# Patient Record
Sex: Male | Born: 1970
Health system: Southern US, Community
[De-identification: ages and names within clinical notes are randomized; demographics above are authoritative.]

## PROBLEM LIST (undated history)

## (undated) DIAGNOSIS — I739 Peripheral vascular disease, unspecified: Secondary | ICD-10-CM

## (undated) DIAGNOSIS — I10 Essential (primary) hypertension: Secondary | ICD-10-CM

## (undated) DIAGNOSIS — E119 Type 2 diabetes mellitus without complications: Secondary | ICD-10-CM

## (undated) DIAGNOSIS — N186 End stage renal disease: Secondary | ICD-10-CM

## (undated) DIAGNOSIS — E785 Hyperlipidemia, unspecified: Secondary | ICD-10-CM

---

## 2003-08-11 ENCOUNTER — Inpatient Hospital Stay (HOSPITAL_COMMUNITY): Admission: AD | Admit: 2003-08-11 | Discharge: 2003-08-12 | Payer: Self-pay | Admitting: Internal Medicine

## 2003-08-11 ENCOUNTER — Encounter: Payer: Self-pay | Admitting: Internal Medicine

## 2003-08-12 ENCOUNTER — Encounter: Payer: Self-pay | Admitting: Internal Medicine

## 2005-09-11 ENCOUNTER — Emergency Department (HOSPITAL_COMMUNITY): Admission: EM | Admit: 2005-09-11 | Discharge: 2005-09-11 | Payer: Self-pay | Admitting: Emergency Medicine

## 2018-08-27 DIAGNOSIS — I10 Essential (primary) hypertension: Secondary | ICD-10-CM | POA: Diagnosis not present

## 2018-08-27 DIAGNOSIS — R0789 Other chest pain: Secondary | ICD-10-CM | POA: Diagnosis not present

## 2018-08-27 DIAGNOSIS — E1159 Type 2 diabetes mellitus with other circulatory complications: Secondary | ICD-10-CM | POA: Diagnosis not present

## 2018-08-27 DIAGNOSIS — E1122 Type 2 diabetes mellitus with diabetic chronic kidney disease: Secondary | ICD-10-CM | POA: Diagnosis not present

## 2018-09-19 DIAGNOSIS — R079 Chest pain, unspecified: Secondary | ICD-10-CM | POA: Diagnosis not present

## 2018-09-19 DIAGNOSIS — Z8679 Personal history of other diseases of the circulatory system: Secondary | ICD-10-CM | POA: Diagnosis not present

## 2018-09-19 DIAGNOSIS — I739 Peripheral vascular disease, unspecified: Secondary | ICD-10-CM | POA: Diagnosis not present

## 2018-09-19 DIAGNOSIS — E119 Type 2 diabetes mellitus without complications: Secondary | ICD-10-CM | POA: Diagnosis not present

## 2018-09-24 DIAGNOSIS — D649 Anemia, unspecified: Secondary | ICD-10-CM | POA: Diagnosis not present

## 2018-09-24 DIAGNOSIS — R809 Proteinuria, unspecified: Secondary | ICD-10-CM | POA: Diagnosis not present

## 2018-09-24 DIAGNOSIS — N184 Chronic kidney disease, stage 4 (severe): Secondary | ICD-10-CM | POA: Diagnosis not present

## 2018-10-03 ENCOUNTER — Other Ambulatory Visit (HOSPITAL_COMMUNITY): Payer: Self-pay | Admitting: Nephrology

## 2018-10-03 DIAGNOSIS — N183 Chronic kidney disease, stage 3 unspecified: Secondary | ICD-10-CM

## 2018-10-06 ENCOUNTER — Other Ambulatory Visit (HOSPITAL_COMMUNITY): Payer: Self-pay | Admitting: Nephrology

## 2018-10-06 ENCOUNTER — Ambulatory Visit (INDEPENDENT_AMBULATORY_CARE_PROVIDER_SITE_OTHER): Payer: 59 | Admitting: Vascular Surgery

## 2018-10-06 ENCOUNTER — Encounter: Payer: Self-pay | Admitting: Vascular Surgery

## 2018-10-06 VITALS — BP 140/92 | HR 86 | Temp 97.5°F | Resp 18 | Ht 66.0 in | Wt 204.2 lb

## 2018-10-06 DIAGNOSIS — I739 Peripheral vascular disease, unspecified: Secondary | ICD-10-CM | POA: Diagnosis not present

## 2018-10-06 DIAGNOSIS — I10 Essential (primary) hypertension: Secondary | ICD-10-CM

## 2018-10-06 NOTE — Progress Notes (Signed)
Vascular and Vein Specialist of [facility]  Patient name: Larry Banks MRN: [id number] DOB: 1971/05/28 Sex: male  REASON FOR CONSULT: Evaluate lower extremity arterial insufficiency  Seen today in our [place] office  HPI: Larry Banks is a 47 y.o. male, who is here today with his wife for further discussion of lower extremity arterial insufficiency.  He is a very pleasant gentleman.  He has 2 components of his lower extremity discomfort.  He does have very straightforward calf claudication.  He can walk very little before he has to stop rest for few minutes and then resume.  Fortunately has no history of any lower extremity tissue loss.  He also has significant bilateral nocturnal cramping which is very uncomfortable to him.  No history of cardiac disease and no history of stroke.  He is right-handed.  He does have severe renal insufficiency and is being evaluated by nephrology for potential dialysis versus transplant.  He is a long-term cigarette smoker.  History reviewed. No pertinent past medical history.  History reviewed. No pertinent family history.  SOCIAL HISTORY: Social History   Socioeconomic History  . Marital status: Married    Spouse name: Not on file  . Number of children: Not on file  . Years of education: Not on file  . Highest education level: Not on file  Occupational History  . Not on file  Social Needs  . Financial resource strain: Not on file  . Food insecurity:    Worry: Not on file    Inability: Not on file  . Transportation needs:    Medical: Not on file    Non-medical: Not on file  Tobacco Use  . Smoking status: Not on file  Substance and Sexual Activity  . Alcohol use: Not on file  . Drug use: Not on file  . Sexual activity: Not on file  Lifestyle  . Physical activity:    Days per week: Not on file    Minutes per session: Not on file  . Stress: Not on file  Relationships  . Social connections:      Talks on phone: Not on file    Gets together: Not on file    Attends religious service: Not on file    Active member of club or organization: Not on file    Attends meetings of clubs or organizations: Not on file    Relationship status: Not on file  . Intimate partner violence:    Fear of current or ex partner: Not on file    Emotionally abused: Not on file    Physically abused: Not on file    Forced sexual activity: Not on file  Other Topics Concern  . Not on file  Social History Narrative  . Not on file    No Known Allergies  Current Outpatient Medications  Medication Sig Dispense Refill  . atorvastatin (LIPITOR) 20 MG tablet Take 20 mg by mouth daily.    . chlorthalidone (HYGROTON) 25 MG tablet Take 25 mg by mouth daily.     No current facility-administered medications for this visit.     REVIEW OF SYSTEMS:  [X]  denotes positive finding, [ ]  denotes negative finding Cardiac  Comments:  Chest pain or chest pressure: x   Shortness of breath upon exertion: x   Short of breath when lying flat:    Irregular heart rhythm:        Vascular    Pain in calf, thigh, or hip brought on by  ambulation: x   Pain in feet at night that wakes you up from your sleep:  x   Blood clot in your veins:    Leg swelling:         Pulmonary    Oxygen at home:    Productive cough:     Wheezing:         Neurologic    Sudden weakness in arms or legs:     Sudden numbness in arms or legs:     Sudden onset of difficulty speaking or slurred speech:    Temporary loss of vision in one eye:     Problems with dizziness:         Gastrointestinal    Blood in stool:     Vomited blood:         Genitourinary    Burning when urinating:     Blood in urine:        Psychiatric    Major depression:         Hematologic    Bleeding problems:    Problems with blood clotting too easily:        Skin    Rashes or ulcers:        Constitutional    Fever or chills:      PHYSICAL EXAM: Vitals:    10/06/18 1028  BP: (!) 140/92  Pulse: 86  Resp: 18  Temp: (!) 97.5 F (36.4 C)  TempSrc: Temporal  Weight: 204 lb 3.2 oz (92.6 kg)  Height: 5\' 6"  (1.676 m)    GENERAL: The patient is a well-nourished male, in no acute distress. The vital signs are documented above. CARDIOVASCULAR: Carotid arteries without bruits bilaterally.  2+ radial and 2+ ulnar pulses bilaterally.  Large surface veins with nice cephalic veins bilaterally.  I do not palpate popliteal or distal pulses bilaterally. PULMONARY: There is good air exchange  ABDOMEN: Soft and non-tender  MUSCULOSKELETAL: There are no major deformities or cyanosis. NEUROLOGIC: No focal weakness or paresthesias are detected. SKIN: There are no ulcers or rashes noted. PSYCHIATRIC: The patient has a normal affect.  DATA:  Invasive studies 09/19/2018 at [facility] rocking him to ankle arm index of 0.89 on the right and 0.44 on the left  MEDICAL ISSUES: Long discussion with patient and his wife.  I explained that he does have very clear-cut left calf claudication symptoms related to his probable superficial femoral artery occlusion.  Fortunately he has not had any tissue loss or arterial rest pain.  Explained that the next evaluation would be arteriography.  I explained that this would put him at extreme high risk for progression to renal failure with his uncertain current renal status.  I have recommended that we defer any evaluation until his renal work-up is complete.  If he should develop any tissue loss he knows to notify us and we would proceed.  Otherwise he will continue his walking program.  I discussed the critical importance of smoking cessation with him.  We will see him again in 3 months for continued discussion   Rosetta Posner, MD [facility] Vascular and Vein Specialists of [facility] Tel [phone] Pager [phone]

## 2018-10-07 ENCOUNTER — Encounter: Payer: Self-pay | Admitting: Family Medicine

## 2018-10-15 ENCOUNTER — Ambulatory Visit (HOSPITAL_BASED_OUTPATIENT_CLINIC_OR_DEPARTMENT_OTHER)
Admission: RE | Admit: 2018-10-15 | Discharge: 2018-10-15 | Disposition: A | Discharge status: Home / Self Care | Payer: 59 | Source: Ambulatory Visit | Attending: Family Medicine | Admitting: Family Medicine

## 2018-10-15 ENCOUNTER — Ambulatory Visit (HOSPITAL_COMMUNITY)
Admission: RE | Admit: 2018-10-15 | Discharge: 2018-10-15 | Disposition: A | Discharge status: Home / Self Care | Payer: 59 | Source: Ambulatory Visit | Attending: Nephrology | Admitting: Nephrology

## 2018-10-15 DIAGNOSIS — N184 Chronic kidney disease, stage 4 (severe): Secondary | ICD-10-CM | POA: Diagnosis not present

## 2018-10-15 DIAGNOSIS — N183 Chronic kidney disease, stage 3 unspecified: Secondary | ICD-10-CM

## 2018-10-15 DIAGNOSIS — I10 Essential (primary) hypertension: Secondary | ICD-10-CM

## 2018-10-15 DIAGNOSIS — Z79899 Other long term (current) drug therapy: Secondary | ICD-10-CM | POA: Diagnosis not present

## 2018-10-15 NOTE — Progress Notes (Signed)
*  PRELIMINARY RESULTS* Echocardiogram 2D Echocardiogram has been performed.  Samuel Germany 10/15/2018, 9:56 AM

## 2018-11-12 DIAGNOSIS — Z23 Encounter for immunization: Secondary | ICD-10-CM | POA: Diagnosis not present

## 2018-11-17 DIAGNOSIS — N183 Chronic kidney disease, stage 3 (moderate): Secondary | ICD-10-CM | POA: Diagnosis not present

## 2018-11-17 DIAGNOSIS — I70213 Atherosclerosis of native arteries of extremities with intermittent claudication, bilateral legs: Secondary | ICD-10-CM | POA: Diagnosis not present

## 2018-11-17 DIAGNOSIS — E1122 Type 2 diabetes mellitus with diabetic chronic kidney disease: Secondary | ICD-10-CM | POA: Diagnosis not present

## 2018-11-17 DIAGNOSIS — I739 Peripheral vascular disease, unspecified: Secondary | ICD-10-CM | POA: Diagnosis not present

## 2019-01-05 ENCOUNTER — Ambulatory Visit: Payer: 59 | Admitting: Vascular Surgery

## 2019-01-23 DIAGNOSIS — Z87891 Personal history of nicotine dependence: Secondary | ICD-10-CM | POA: Diagnosis not present

## 2019-01-23 DIAGNOSIS — I739 Peripheral vascular disease, unspecified: Secondary | ICD-10-CM | POA: Diagnosis not present

## 2019-01-23 DIAGNOSIS — N184 Chronic kidney disease, stage 4 (severe): Secondary | ICD-10-CM | POA: Diagnosis not present

## 2019-03-09 DIAGNOSIS — N185 Chronic kidney disease, stage 5: Secondary | ICD-10-CM | POA: Diagnosis not present

## 2019-03-09 DIAGNOSIS — E1122 Type 2 diabetes mellitus with diabetic chronic kidney disease: Secondary | ICD-10-CM | POA: Diagnosis not present

## 2019-03-12 DIAGNOSIS — E1122 Type 2 diabetes mellitus with diabetic chronic kidney disease: Secondary | ICD-10-CM | POA: Diagnosis not present

## 2019-03-12 DIAGNOSIS — I120 Hypertensive chronic kidney disease with stage 5 chronic kidney disease or end stage renal disease: Secondary | ICD-10-CM | POA: Diagnosis not present

## 2019-03-12 DIAGNOSIS — N185 Chronic kidney disease, stage 5: Secondary | ICD-10-CM | POA: Diagnosis not present

## 2019-03-16 DIAGNOSIS — I129 Hypertensive chronic kidney disease with stage 1 through stage 4 chronic kidney disease, or unspecified chronic kidney disease: Secondary | ICD-10-CM | POA: Diagnosis not present

## 2019-03-16 DIAGNOSIS — N183 Chronic kidney disease, stage 3 (moderate): Secondary | ICD-10-CM | POA: Diagnosis not present

## 2019-03-16 DIAGNOSIS — I739 Peripheral vascular disease, unspecified: Secondary | ICD-10-CM | POA: Diagnosis not present

## 2019-03-16 DIAGNOSIS — N189 Chronic kidney disease, unspecified: Secondary | ICD-10-CM | POA: Diagnosis not present

## 2019-03-16 DIAGNOSIS — Z7982 Long term (current) use of aspirin: Secondary | ICD-10-CM | POA: Diagnosis not present

## 2019-03-16 DIAGNOSIS — I70212 Atherosclerosis of native arteries of extremities with intermittent claudication, left leg: Secondary | ICD-10-CM | POA: Diagnosis not present

## 2019-03-16 DIAGNOSIS — E1122 Type 2 diabetes mellitus with diabetic chronic kidney disease: Secondary | ICD-10-CM | POA: Diagnosis not present

## 2019-03-19 DIAGNOSIS — N185 Chronic kidney disease, stage 5: Secondary | ICD-10-CM | POA: Diagnosis not present

## 2019-04-08 DIAGNOSIS — I739 Peripheral vascular disease, unspecified: Secondary | ICD-10-CM | POA: Diagnosis not present

## 2019-04-08 DIAGNOSIS — E1122 Type 2 diabetes mellitus with diabetic chronic kidney disease: Secondary | ICD-10-CM | POA: Diagnosis not present

## 2019-04-08 DIAGNOSIS — N185 Chronic kidney disease, stage 5: Secondary | ICD-10-CM | POA: Diagnosis not present

## 2019-04-08 DIAGNOSIS — I10 Essential (primary) hypertension: Secondary | ICD-10-CM | POA: Diagnosis not present

## 2019-04-08 DIAGNOSIS — N189 Chronic kidney disease, unspecified: Secondary | ICD-10-CM | POA: Diagnosis not present

## 2019-05-08 DIAGNOSIS — I739 Peripheral vascular disease, unspecified: Secondary | ICD-10-CM

## 2019-08-01 ENCOUNTER — Emergency Department (HOSPITAL_COMMUNITY): Payer: 59

## 2019-08-01 ENCOUNTER — Inpatient Hospital Stay (HOSPITAL_COMMUNITY): Payer: 59

## 2019-08-01 ENCOUNTER — Encounter (HOSPITAL_COMMUNITY): Payer: Self-pay

## 2019-08-01 ENCOUNTER — Inpatient Hospital Stay (HOSPITAL_COMMUNITY)
Admission: EM | Admit: 2019-08-01 | Discharge: 2019-09-03 | DRG: 004 | Disposition: E | Payer: 59 | Attending: Family Medicine | Admitting: Family Medicine

## 2019-08-01 ENCOUNTER — Other Ambulatory Visit: Payer: Self-pay

## 2019-08-01 DIAGNOSIS — I959 Hypotension, unspecified: Secondary | ICD-10-CM | POA: Diagnosis present

## 2019-08-01 DIAGNOSIS — R1310 Dysphagia, unspecified: Secondary | ICD-10-CM | POA: Diagnosis present

## 2019-08-01 DIAGNOSIS — E440 Moderate protein-calorie malnutrition: Secondary | ICD-10-CM | POA: Diagnosis present

## 2019-08-01 DIAGNOSIS — R739 Hyperglycemia, unspecified: Secondary | ICD-10-CM | POA: Diagnosis not present

## 2019-08-01 DIAGNOSIS — Z20828 Contact with and (suspected) exposure to other viral communicable diseases: Secondary | ICD-10-CM | POA: Diagnosis present

## 2019-08-01 DIAGNOSIS — E785 Hyperlipidemia, unspecified: Secondary | ICD-10-CM | POA: Diagnosis present

## 2019-08-01 DIAGNOSIS — I469 Cardiac arrest, cause unspecified: Secondary | ICD-10-CM

## 2019-08-01 DIAGNOSIS — D638 Anemia in other chronic diseases classified elsewhere: Secondary | ICD-10-CM | POA: Diagnosis present

## 2019-08-01 DIAGNOSIS — Z452 Encounter for adjustment and management of vascular access device: Secondary | ICD-10-CM | POA: Diagnosis not present

## 2019-08-01 DIAGNOSIS — E872 Acidosis: Secondary | ICD-10-CM | POA: Diagnosis present

## 2019-08-01 DIAGNOSIS — Z79899 Other long term (current) drug therapy: Secondary | ICD-10-CM

## 2019-08-01 DIAGNOSIS — E875 Hyperkalemia: Secondary | ICD-10-CM | POA: Diagnosis not present

## 2019-08-01 DIAGNOSIS — N2581 Secondary hyperparathyroidism of renal origin: Secondary | ICD-10-CM | POA: Diagnosis present

## 2019-08-01 DIAGNOSIS — R569 Unspecified convulsions: Secondary | ICD-10-CM | POA: Diagnosis not present

## 2019-08-01 DIAGNOSIS — Z66 Do not resuscitate: Secondary | ICD-10-CM | POA: Diagnosis not present

## 2019-08-01 DIAGNOSIS — Z978 Presence of other specified devices: Secondary | ICD-10-CM | POA: Diagnosis not present

## 2019-08-01 DIAGNOSIS — J690 Pneumonitis due to inhalation of food and vomit: Secondary | ICD-10-CM | POA: Diagnosis present

## 2019-08-01 DIAGNOSIS — R627 Adult failure to thrive: Secondary | ICD-10-CM | POA: Diagnosis present

## 2019-08-01 DIAGNOSIS — N186 End stage renal disease: Secondary | ICD-10-CM | POA: Diagnosis present

## 2019-08-01 DIAGNOSIS — Z515 Encounter for palliative care: Secondary | ICD-10-CM | POA: Diagnosis not present

## 2019-08-01 DIAGNOSIS — J9811 Atelectasis: Secondary | ICD-10-CM | POA: Diagnosis not present

## 2019-08-01 DIAGNOSIS — D509 Iron deficiency anemia, unspecified: Secondary | ICD-10-CM | POA: Diagnosis present

## 2019-08-01 DIAGNOSIS — Z7984 Long term (current) use of oral hypoglycemic drugs: Secondary | ICD-10-CM

## 2019-08-01 DIAGNOSIS — I120 Hypertensive chronic kidney disease with stage 5 chronic kidney disease or end stage renal disease: Secondary | ICD-10-CM | POA: Diagnosis present

## 2019-08-01 DIAGNOSIS — H518 Other specified disorders of binocular movement: Secondary | ICD-10-CM | POA: Diagnosis not present

## 2019-08-01 DIAGNOSIS — Z431 Encounter for attention to gastrostomy: Secondary | ICD-10-CM

## 2019-08-01 DIAGNOSIS — E1165 Type 2 diabetes mellitus with hyperglycemia: Secondary | ICD-10-CM | POA: Diagnosis present

## 2019-08-01 DIAGNOSIS — J810 Acute pulmonary edema: Secondary | ICD-10-CM

## 2019-08-01 DIAGNOSIS — Z992 Dependence on renal dialysis: Secondary | ICD-10-CM

## 2019-08-01 DIAGNOSIS — J9602 Acute respiratory failure with hypercapnia: Secondary | ICD-10-CM | POA: Diagnosis present

## 2019-08-01 DIAGNOSIS — D631 Anemia in chronic kidney disease: Secondary | ICD-10-CM | POA: Diagnosis present

## 2019-08-01 DIAGNOSIS — A419 Sepsis, unspecified organism: Secondary | ICD-10-CM | POA: Diagnosis not present

## 2019-08-01 DIAGNOSIS — J9601 Acute respiratory failure with hypoxia: Principal | ICD-10-CM

## 2019-08-01 DIAGNOSIS — J9600 Acute respiratory failure, unspecified whether with hypoxia or hypercapnia: Secondary | ICD-10-CM | POA: Diagnosis not present

## 2019-08-01 DIAGNOSIS — T17990A Other foreign object in respiratory tract, part unspecified in causing asphyxiation, initial encounter: Secondary | ICD-10-CM | POA: Diagnosis present

## 2019-08-01 DIAGNOSIS — I493 Ventricular premature depolarization: Secondary | ICD-10-CM | POA: Diagnosis not present

## 2019-08-01 DIAGNOSIS — E1122 Type 2 diabetes mellitus with diabetic chronic kidney disease: Secondary | ICD-10-CM | POA: Diagnosis present

## 2019-08-01 DIAGNOSIS — H1133 Conjunctival hemorrhage, bilateral: Secondary | ICD-10-CM | POA: Diagnosis not present

## 2019-08-01 DIAGNOSIS — L8990 Pressure ulcer of unspecified site, unspecified stage: Secondary | ICD-10-CM | POA: Insufficient documentation

## 2019-08-01 DIAGNOSIS — I10 Essential (primary) hypertension: Secondary | ICD-10-CM | POA: Diagnosis not present

## 2019-08-01 DIAGNOSIS — E1159 Type 2 diabetes mellitus with other circulatory complications: Secondary | ICD-10-CM | POA: Diagnosis not present

## 2019-08-01 DIAGNOSIS — E1151 Type 2 diabetes mellitus with diabetic peripheral angiopathy without gangrene: Secondary | ICD-10-CM | POA: Diagnosis present

## 2019-08-01 DIAGNOSIS — G936 Cerebral edema: Secondary | ICD-10-CM | POA: Diagnosis not present

## 2019-08-01 DIAGNOSIS — Z6837 Body mass index (BMI) 37.0-37.9, adult: Secondary | ICD-10-CM

## 2019-08-01 DIAGNOSIS — E8770 Fluid overload, unspecified: Secondary | ICD-10-CM | POA: Diagnosis present

## 2019-08-01 DIAGNOSIS — Z7189 Other specified counseling: Secondary | ICD-10-CM | POA: Diagnosis not present

## 2019-08-01 DIAGNOSIS — R509 Fever, unspecified: Secondary | ICD-10-CM

## 2019-08-01 DIAGNOSIS — I517 Cardiomegaly: Secondary | ICD-10-CM | POA: Diagnosis present

## 2019-08-01 DIAGNOSIS — R339 Retention of urine, unspecified: Secondary | ICD-10-CM | POA: Diagnosis not present

## 2019-08-01 DIAGNOSIS — J90 Pleural effusion, not elsewhere classified: Secondary | ICD-10-CM | POA: Diagnosis present

## 2019-08-01 DIAGNOSIS — Z9911 Dependence on respirator [ventilator] status: Secondary | ICD-10-CM

## 2019-08-01 DIAGNOSIS — E669 Obesity, unspecified: Secondary | ICD-10-CM | POA: Diagnosis present

## 2019-08-01 DIAGNOSIS — G931 Anoxic brain damage, not elsewhere classified: Secondary | ICD-10-CM | POA: Diagnosis present

## 2019-08-01 DIAGNOSIS — R5081 Fever presenting with conditions classified elsewhere: Secondary | ICD-10-CM | POA: Diagnosis not present

## 2019-08-01 DIAGNOSIS — Z7982 Long term (current) use of aspirin: Secondary | ICD-10-CM

## 2019-08-01 DIAGNOSIS — R0682 Tachypnea, not elsewhere classified: Secondary | ICD-10-CM

## 2019-08-01 DIAGNOSIS — Z930 Tracheostomy status: Secondary | ICD-10-CM

## 2019-08-01 DIAGNOSIS — G40909 Epilepsy, unspecified, not intractable, without status epilepticus: Secondary | ICD-10-CM | POA: Diagnosis present

## 2019-08-01 DIAGNOSIS — T783XXA Angioneurotic edema, initial encounter: Secondary | ICD-10-CM | POA: Diagnosis not present

## 2019-08-01 DIAGNOSIS — J9690 Respiratory failure, unspecified, unspecified whether with hypoxia or hypercapnia: Secondary | ICD-10-CM

## 2019-08-01 HISTORY — DX: Peripheral vascular disease, unspecified: I73.9

## 2019-08-01 HISTORY — DX: Essential (primary) hypertension: I10

## 2019-08-01 HISTORY — DX: Type 2 diabetes mellitus without complications: E11.9

## 2019-08-01 HISTORY — DX: Hyperlipidemia, unspecified: E78.5

## 2019-08-01 HISTORY — DX: End stage renal disease: N18.6

## 2019-08-01 LAB — CBC WITH DIFFERENTIAL/PLATELET
Abs Immature Granulocytes: 0.13 10*3/uL — ABNORMAL HIGH (ref 0.00–0.07)
Abs Immature Granulocytes: 0.03 10*3/uL (ref 0.00–0.07)
Basophils Absolute: 0.1 10*3/uL (ref 0.0–0.1)
Basophils Absolute: 0 10*3/uL (ref 0.0–0.1)
Basophils Relative: 0 %
Basophils Relative: 0 %
Eosinophils Absolute: 0.2 10*3/uL (ref 0.0–0.5)
Eosinophils Absolute: 0 10*3/uL (ref 0.0–0.5)
Eosinophils Relative: 1 %
Eosinophils Relative: 0 %
HCT: 28.1 % — ABNORMAL LOW (ref 39.0–52.0)
HCT: 20.8 % — ABNORMAL LOW (ref 39.0–52.0)
Hemoglobin: 7.8 g/dL — ABNORMAL LOW (ref 13.0–17.0)
Hemoglobin: 6.7 g/dL — CL (ref 13.0–17.0)
Immature Granulocytes: 1 %
Immature Granulocytes: 0 %
Lymphocytes Relative: 14 %
Lymphocytes Relative: 13 %
Lymphs Abs: 2.9 10*3/uL (ref 0.7–4.0)
Lymphs Abs: 1.3 10*3/uL (ref 0.7–4.0)
MCH: 28.2 pg (ref 26.0–34.0)
MCH: 29.1 pg (ref 26.0–34.0)
MCHC: 27.8 g/dL — ABNORMAL LOW (ref 30.0–36.0)
MCHC: 32.2 g/dL (ref 30.0–36.0)
MCV: 101.4 fL — ABNORMAL HIGH (ref 80.0–100.0)
MCV: 90.4 fL (ref 80.0–100.0)
Monocytes Absolute: 0.9 10*3/uL (ref 0.1–1.0)
Monocytes Absolute: 0.6 10*3/uL (ref 0.1–1.0)
Monocytes Relative: 4 %
Monocytes Relative: 7 %
Neutro Abs: 16 10*3/uL — ABNORMAL HIGH (ref 1.7–7.7)
Neutro Abs: 7.6 10*3/uL (ref 1.7–7.7)
Neutrophils Relative %: 80 %
Neutrophils Relative %: 80 %
Platelets: 364 10*3/uL (ref 150–400)
Platelets: 227 10*3/uL (ref 150–400)
RBC: 2.77 MIL/uL — ABNORMAL LOW (ref 4.22–5.81)
RBC: 2.3 MIL/uL — ABNORMAL LOW (ref 4.22–5.81)
RDW: 14.2 % (ref 11.5–15.5)
RDW: 14.1 % (ref 11.5–15.5)
WBC: 20.2 10*3/uL — ABNORMAL HIGH (ref 4.0–10.5)
WBC: 9.7 10*3/uL (ref 4.0–10.5)
nRBC: 0 % (ref 0.0–0.2)
nRBC: 0 % (ref 0.0–0.2)

## 2019-08-01 LAB — MRSA PCR SCREENING: MRSA by PCR: NEGATIVE

## 2019-08-01 LAB — IRON AND TIBC
Iron: 8 ug/dL — ABNORMAL LOW (ref 45–182)
Saturation Ratios: 3 % — ABNORMAL LOW (ref 17.9–39.5)
TIBC: 307 ug/dL (ref 250–450)
UIBC: 299 ug/dL

## 2019-08-01 LAB — URINALYSIS, ROUTINE W REFLEX MICROSCOPIC
Bilirubin Urine: NEGATIVE
Glucose, UA: 50 mg/dL — AB
Ketones, ur: NEGATIVE mg/dL
Leukocytes,Ua: NEGATIVE
Nitrite: NEGATIVE
Protein, ur: 100 mg/dL — AB
Specific Gravity, Urine: 1.012 (ref 1.005–1.030)
pH: 5 (ref 5.0–8.0)

## 2019-08-01 LAB — BLOOD GAS, ARTERIAL
Acid-base deficit: 5.1 mmol/L — ABNORMAL HIGH (ref 0.0–2.0)
Acid-base deficit: 1.3 mmol/L (ref 0.0–2.0)
Bicarbonate: 19.6 mmol/L — ABNORMAL LOW (ref 20.0–28.0)
Bicarbonate: 23.2 mmol/L (ref 20.0–28.0)
FIO2: 100
FIO2: 100
O2 Saturation: 99.9 %
O2 Saturation: 99.1 %
Patient temperature: 37
Patient temperature: 37
pCO2 arterial: 65.8 mmHg (ref 32.0–48.0)
pCO2 arterial: 45.5 mmHg (ref 32.0–48.0)
pH, Arterial: 7.159 — CL (ref 7.350–7.450)
pH, Arterial: 7.336 — ABNORMAL LOW (ref 7.350–7.450)
pO2, Arterial: >552 mmHg — ABNORMAL HIGH (ref 83.0–108.0)
pO2, Arterial: 152 mmHg — ABNORMAL HIGH (ref 83.0–108.0)

## 2019-08-01 LAB — POCT I-STAT EG7
Acid-Base Excess: 1 mmol/L (ref 0.0–2.0)
Bicarbonate: 27.1 mmol/L (ref 20.0–28.0)
Calcium, Ion: 1.2 mmol/L (ref 1.15–1.40)
HCT: 23 % — ABNORMAL LOW (ref 39.0–52.0)
Hemoglobin: 7.8 g/dL — ABNORMAL LOW (ref 13.0–17.0)
O2 Saturation: 54 %
Potassium: 5.4 mmol/L — ABNORMAL HIGH (ref 3.5–5.1)
Sodium: 133 mmol/L — ABNORMAL LOW (ref 135–145)
TCO2: 29 mmol/L (ref 22–32)
pCO2, Ven: 48.8 mmHg (ref 44.0–60.0)
pH, Ven: 7.353 (ref 7.250–7.430)
pO2, Ven: 30 mmHg — CL (ref 32.0–45.0)

## 2019-08-01 LAB — POCT I-STAT 7, (LYTES, BLD GAS, ICA,H+H)
Acid-Base Excess: 5 mmol/L — ABNORMAL HIGH (ref 0.0–2.0)
Acid-Base Excess: 5 mmol/L — ABNORMAL HIGH (ref 0.0–2.0)
Bicarbonate: 24.8 mmol/L (ref 20.0–28.0)
Bicarbonate: 27.5 mmol/L (ref 20.0–28.0)
Bicarbonate: 29.4 mmol/L — ABNORMAL HIGH (ref 20.0–28.0)
Calcium, Ion: 1.21 mmol/L (ref 1.15–1.40)
Calcium, Ion: 1.13 mmol/L — ABNORMAL LOW (ref 1.15–1.40)
Calcium, Ion: 1.12 mmol/L — ABNORMAL LOW (ref 1.15–1.40)
HCT: 21 % — ABNORMAL LOW (ref 39.0–52.0)
HCT: 19 % — ABNORMAL LOW (ref 39.0–52.0)
HCT: 19 % — ABNORMAL LOW (ref 39.0–52.0)
Hemoglobin: 7.1 g/dL — ABNORMAL LOW (ref 13.0–17.0)
Hemoglobin: 6.5 g/dL — CL (ref 13.0–17.0)
Hemoglobin: 6.5 g/dL — CL (ref 13.0–17.0)
O2 Saturation: 83 %
O2 Saturation: 95 %
O2 Saturation: 93 %
Patient temperature: 98.6
Patient temperature: 35.7
Patient temperature: 36
Potassium: 5.5 mmol/L — ABNORMAL HIGH (ref 3.5–5.1)
Potassium: 4.5 mmol/L (ref 3.5–5.1)
Potassium: 4.1 mmol/L (ref 3.5–5.1)
Sodium: 132 mmol/L — ABNORMAL LOW (ref 135–145)
Sodium: 135 mmol/L (ref 135–145)
Sodium: 136 mmol/L (ref 135–145)
TCO2: 26 mmol/L (ref 22–32)
TCO2: 28 mmol/L (ref 22–32)
TCO2: 31 mmol/L (ref 22–32)
pCO2 arterial: 42.5 mmHg (ref 32.0–48.0)
pCO2 arterial: 29.8 mmHg — ABNORMAL LOW (ref 32.0–48.0)
pCO2 arterial: 37.6 mmHg (ref 32.0–48.0)
pH, Arterial: 7.373 (ref 7.350–7.450)
pH, Arterial: 7.569 — ABNORMAL HIGH (ref 7.350–7.450)
pH, Arterial: 7.497 — ABNORMAL HIGH (ref 7.350–7.450)
pO2, Arterial: 49 mmHg — ABNORMAL LOW (ref 83.0–108.0)
pO2, Arterial: 58 mmHg — ABNORMAL LOW (ref 83.0–108.0)
pO2, Arterial: 58 mmHg — ABNORMAL LOW (ref 83.0–108.0)

## 2019-08-01 LAB — ECHOCARDIOGRAM COMPLETE
Height: 66 in
Weight: 3266.34 oz

## 2019-08-01 LAB — BASIC METABOLIC PANEL
Anion gap: 13 (ref 5–15)
BUN: 48 mg/dL — ABNORMAL HIGH (ref 6–20)
CO2: 26 mmol/L (ref 22–32)
Calcium: 8.6 mg/dL — ABNORMAL LOW (ref 8.9–10.3)
Chloride: 97 mmol/L — ABNORMAL LOW (ref 98–111)
Creatinine, Ser: 6.49 mg/dL — ABNORMAL HIGH (ref 0.61–1.24)
GFR calc Af Amer: 11 mL/min — ABNORMAL LOW (ref >60)
GFR calc non Af Amer: 9 mL/min — ABNORMAL LOW (ref >60)
Glucose, Bld: 97 mg/dL (ref 70–99)
Potassium: 4.1 mmol/L (ref 3.5–5.1)
Sodium: 136 mmol/L (ref 135–145)

## 2019-08-01 LAB — TROPONIN I (HIGH SENSITIVITY)
Troponin I (High Sensitivity): 45 ng/L — ABNORMAL HIGH (ref <18)
Troponin I (High Sensitivity): 238 ng/L
Troponin I (High Sensitivity): 575 ng/L (ref <18)
Troponin I (High Sensitivity): 606 ng/L (ref <18)

## 2019-08-01 LAB — SARS CORONAVIRUS 2 BY RT PCR (HOSPITAL ORDER, PERFORMED IN ~~LOC~~ HOSPITAL LAB): SARS Coronavirus 2: NEGATIVE

## 2019-08-01 LAB — STREP PNEUMONIAE URINARY ANTIGEN: Strep Pneumo Urinary Antigen: NEGATIVE

## 2019-08-01 LAB — MAGNESIUM: Magnesium: 2.1 mg/dL (ref 1.7–2.4)

## 2019-08-01 LAB — CBG MONITORING, ED: Glucose-Capillary: 238 mg/dL — ABNORMAL HIGH (ref 70–99)

## 2019-08-01 LAB — COMPREHENSIVE METABOLIC PANEL
ALT: 19 U/L (ref 0–44)
AST: 32 U/L (ref 15–41)
Albumin: 3.4 g/dL — ABNORMAL LOW (ref 3.5–5.0)
Alkaline Phosphatase: 75 U/L (ref 38–126)
Anion gap: 11 (ref 5–15)
BUN: 39 mg/dL — ABNORMAL HIGH (ref 6–20)
CO2: 21 mmol/L — ABNORMAL LOW (ref 22–32)
Calcium: 9.9 mg/dL (ref 8.9–10.3)
Chloride: 98 mmol/L (ref 98–111)
Creatinine, Ser: 5.87 mg/dL — ABNORMAL HIGH (ref 0.61–1.24)
GFR calc Af Amer: 12 mL/min — ABNORMAL LOW (ref >60)
GFR calc non Af Amer: 10 mL/min — ABNORMAL LOW (ref >60)
Glucose, Bld: 361 mg/dL — ABNORMAL HIGH (ref 70–99)
Potassium: 4.3 mmol/L (ref 3.5–5.1)
Sodium: 130 mmol/L — ABNORMAL LOW (ref 135–145)
Total Bilirubin: 0.3 mg/dL (ref 0.3–1.2)
Total Protein: 6.3 g/dL — ABNORMAL LOW (ref 6.5–8.1)

## 2019-08-01 LAB — PREPARE RBC (CROSSMATCH)

## 2019-08-01 LAB — CBC
HCT: 25.1 % — ABNORMAL LOW (ref 39.0–52.0)
Hemoglobin: 7.6 g/dL — ABNORMAL LOW (ref 13.0–17.0)
MCH: 28.4 pg (ref 26.0–34.0)
MCHC: 30.3 g/dL (ref 30.0–36.0)
MCV: 93.7 fL (ref 80.0–100.0)
Platelets: 287 10*3/uL (ref 150–400)
RBC: 2.68 MIL/uL — ABNORMAL LOW (ref 4.22–5.81)
RDW: 14.2 % (ref 11.5–15.5)
WBC: 14.2 10*3/uL — ABNORMAL HIGH (ref 4.0–10.5)
nRBC: 0 % (ref 0.0–0.2)

## 2019-08-01 LAB — PROTIME-INR
INR: 1.1 (ref 0.8–1.2)
Prothrombin Time: 13.9 seconds (ref 11.4–15.2)

## 2019-08-01 LAB — PHOSPHORUS: Phosphorus: 5.4 mg/dL — ABNORMAL HIGH (ref 2.5–4.6)

## 2019-08-01 LAB — APTT: aPTT: 31 seconds (ref 24–36)

## 2019-08-01 LAB — GLUCOSE, CAPILLARY
Glucose-Capillary: 199 mg/dL — ABNORMAL HIGH (ref 70–99)
Glucose-Capillary: 160 mg/dL — ABNORMAL HIGH (ref 70–99)
Glucose-Capillary: 107 mg/dL — ABNORMAL HIGH (ref 70–99)

## 2019-08-01 LAB — PROCALCITONIN: Procalcitonin: 39.92 ng/mL

## 2019-08-01 LAB — TRIGLYCERIDES: Triglycerides: 59 mg/dL (ref <150)

## 2019-08-01 LAB — LACTIC ACID, PLASMA
Lactic Acid, Venous: 2.9 mmol/L (ref 0.5–1.9)
Lactic Acid, Venous: 1.3 mmol/L (ref 0.5–1.9)

## 2019-08-01 LAB — ABO/RH: ABO/RH(D): A POS

## 2019-08-01 MED ORDER — MIDAZOLAM 50MG/50ML (1MG/ML) PREMIX INFUSION
0.5000 mg/h | INTRAVENOUS | Status: DC
  Administered 2019-08-01: 0.5 mg/h via INTRAVENOUS
  Filled 2019-08-01 (×2): qty 50

## 2019-08-01 MED ORDER — SODIUM CHLORIDE 0.9% IV SOLUTION
Freq: Once | INTRAVENOUS | Status: AC
  Administered 2019-08-01: 23:00:00 via INTRAVENOUS

## 2019-08-01 MED ORDER — PHENYLEPHRINE HCL-NACL 10-0.9 MG/250ML-% IV SOLN
0.0000 ug/min | INTRAVENOUS | Status: DC
  Filled 2019-08-01 (×2): qty 250

## 2019-08-01 MED ORDER — ALBUTEROL SULFATE (2.5 MG/3ML) 0.083% IN NEBU
2.5000 mg | INHALATION_SOLUTION | RESPIRATORY_TRACT | Status: DC | PRN
  Administered 2019-08-10: 2.5 mg via RESPIRATORY_TRACT
  Filled 2019-08-01: qty 3

## 2019-08-01 MED ORDER — PROPOFOL 1000 MG/100ML IV EMUL
5.0000 ug/kg/min | INTRAVENOUS | Status: DC
  Administered 2019-08-01: 80 ug/kg/min via INTRAVENOUS
  Administered 2019-08-01: 23:00:00 75 ug/kg/min via INTRAVENOUS
  Administered 2019-08-01 (×2): 80 ug/kg/min via INTRAVENOUS
  Administered 2019-08-01: 13:00:00 40 ug/kg/min via INTRAVENOUS
  Administered 2019-08-02: 08:00:00 75.054 ug/kg/min via INTRAVENOUS
  Administered 2019-08-02: 75 ug/kg/min via INTRAVENOUS
  Administered 2019-08-02 (×2): 75.054 ug/kg/min via INTRAVENOUS
  Administered 2019-08-02: 50 ug/kg/min via INTRAVENOUS
  Administered 2019-08-02: 17:00:00 75.054 ug/kg/min via INTRAVENOUS
  Administered 2019-08-02: 50 ug/kg/min via INTRAVENOUS
  Administered 2019-08-02 (×2): 75 ug/kg/min via INTRAVENOUS
  Administered 2019-08-03: 50 ug/kg/min via INTRAVENOUS
  Filled 2019-08-01 (×6): qty 100
  Filled 2019-08-01 (×2): qty 200
  Filled 2019-08-01 (×3): qty 100
  Filled 2019-08-01: qty 200
  Filled 2019-08-01: qty 100

## 2019-08-01 MED ORDER — ACETAMINOPHEN 325 MG PO TABS: 650.0000 mg | ORAL_TABLET | ORAL | Status: DC | PRN

## 2019-08-01 MED ORDER — PROPOFOL 1000 MG/100ML IV EMUL
5.0000 ug/kg/min | INTRAVENOUS | Status: DC
  Administered 2019-08-01: 08:00:00 5 ug/kg/min via INTRAVENOUS

## 2019-08-01 MED ORDER — EPINEPHRINE 1 MG/10ML IJ SOSY
1.0000 mg | PREFILLED_SYRINGE | Freq: Once | INTRAMUSCULAR | Status: AC
  Administered 2019-08-01: 1 mg via INTRAVENOUS

## 2019-08-01 MED ORDER — CHLORHEXIDINE GLUCONATE CLOTH 2 % EX PADS
6.0000 | MEDICATED_PAD | Freq: Every day | CUTANEOUS | Status: DC
  Administered 2019-08-01 – 2019-08-03 (×3): 6 via TOPICAL

## 2019-08-01 MED ORDER — FENTANYL CITRATE (PF) 100 MCG/2ML IJ SOLN
50.0000 ug | INTRAMUSCULAR | Status: DC | PRN
  Administered 2019-08-01 – 2019-08-02 (×2): 50 ug via INTRAVENOUS
  Filled 2019-08-01 (×2): qty 2

## 2019-08-01 MED ORDER — ACETAMINOPHEN 325 MG PO TABS
650.0000 mg | ORAL_TABLET | ORAL | Status: DC
  Administered 2019-08-02 (×2): 650 mg
  Filled 2019-08-01 (×2): qty 2

## 2019-08-01 MED ORDER — PANTOPRAZOLE SODIUM 40 MG IV SOLR
40.0000 mg | Freq: Every day | INTRAVENOUS | Status: DC
  Administered 2019-08-01 – 2019-08-04 (×4): 40 mg via INTRAVENOUS
  Filled 2019-08-01 (×4): qty 40

## 2019-08-01 MED ORDER — FUROSEMIDE 10 MG/ML IJ SOLN
INTRAMUSCULAR | Status: AC
  Administered 2019-08-01: 80 mg
  Filled 2019-08-01: qty 8

## 2019-08-01 MED ORDER — ONDANSETRON HCL 4 MG/2ML IJ SOLN
4.0000 mg | Freq: Four times a day (QID) | INTRAMUSCULAR | Status: DC | PRN
  Administered 2019-08-07: 16:00:00 4 mg via INTRAVENOUS
  Filled 2019-08-01: qty 2

## 2019-08-01 MED ORDER — CHLORHEXIDINE GLUCONATE CLOTH 2 % EX PADS
6.0000 | MEDICATED_PAD | Freq: Every day | CUTANEOUS | Status: DC
  Administered 2019-08-01: 6 via TOPICAL

## 2019-08-01 MED ORDER — SODIUM BICARBONATE 8.4 % IV SOLN
50.0000 meq | Freq: Once | INTRAVENOUS | Status: AC
  Administered 2019-08-01: 04:00:00 50 meq via INTRAVENOUS

## 2019-08-01 MED ORDER — DEXTROSE 50 % IV SOLN
50.0000 mL | Freq: Once | INTRAVENOUS | Status: AC
  Administered 2019-08-01: 04:00:00 50 mL via INTRAVENOUS

## 2019-08-01 MED ORDER — CHLORHEXIDINE GLUCONATE 0.12% ORAL RINSE (MEDLINE KIT)
15.0000 mL | Freq: Two times a day (BID) | OROMUCOSAL | Status: DC
  Administered 2019-08-01 – 2019-08-22 (×43): 15 mL via OROMUCOSAL

## 2019-08-01 MED ORDER — ACETAMINOPHEN 325 MG PO TABS: 650.0000 mg | ORAL_TABLET | ORAL | Status: DC

## 2019-08-01 MED ORDER — MIDAZOLAM HCL 2 MG/2ML IJ SOLN
2.5000 mg | Freq: Once | INTRAMUSCULAR | Status: AC
  Administered 2019-08-01: 2.5 mg via INTRAVENOUS
  Filled 2019-08-01: qty 4

## 2019-08-01 MED ORDER — ORAL CARE MOUTH RINSE
15.0000 mL | OROMUCOSAL | Status: DC
  Administered 2019-08-01 – 2019-08-22 (×208): 15 mL via OROMUCOSAL

## 2019-08-01 MED ORDER — PROPOFOL 1000 MG/100ML IV EMUL
INTRAVENOUS | Status: AC
  Administered 2019-08-01: 08:00:00 5 ug/kg/min via INTRAVENOUS
  Filled 2019-08-01: qty 100

## 2019-08-01 MED ORDER — SODIUM CHLORIDE 0.9 % IV SOLN
250.0000 mL | INTRAVENOUS | Status: DC | PRN
  Administered 2019-08-01 – 2019-08-16 (×5): 250 mL via INTRAVENOUS

## 2019-08-01 MED ORDER — CALCIUM CHLORIDE 10 % IV SOLN
1.0000 g | Freq: Once | INTRAVENOUS | Status: AC
  Administered 2019-08-01: 1 g via INTRAVENOUS

## 2019-08-01 MED ORDER — INSULIN ASPART 100 UNIT/ML IV SOLN
10.0000 [IU] | Freq: Once | INTRAVENOUS | Status: AC
  Administered 2019-08-01: 04:00:00 10 [IU] via INTRAVENOUS

## 2019-08-01 MED ORDER — INSULIN ASPART 100 UNIT/ML ~~LOC~~ SOLN
1.0000 [IU] | SUBCUTANEOUS | Status: DC
  Administered 2019-08-01: 2 [IU] via SUBCUTANEOUS
  Administered 2019-08-02 (×2): 1 [IU] via SUBCUTANEOUS
  Administered 2019-08-03 (×2): 2 [IU] via SUBCUTANEOUS

## 2019-08-01 MED ORDER — SODIUM CHLORIDE 0.9% FLUSH
3.0000 mL | Freq: Two times a day (BID) | INTRAVENOUS | Status: DC
  Administered 2019-08-01 – 2019-08-07 (×11): 3 mL via INTRAVENOUS
  Administered 2019-08-07: 10 mL via INTRAVENOUS
  Administered 2019-08-08 – 2019-08-25 (×28): 3 mL via INTRAVENOUS

## 2019-08-01 MED ORDER — SODIUM CHLORIDE 0.9 % IV SOLN
INTRAVENOUS | Status: DC
  Administered 2019-08-01 (×2): via INTRAVENOUS

## 2019-08-01 MED ORDER — SODIUM CHLORIDE 0.9% FLUSH
3.0000 mL | INTRAVENOUS | Status: DC | PRN
  Administered 2019-08-15: 3 mL via INTRAVENOUS
  Filled 2019-08-01: qty 3

## 2019-08-01 MED ORDER — HEPARIN SODIUM (PORCINE) 5000 UNIT/ML IJ SOLN
5000.0000 [IU] | Freq: Three times a day (TID) | INTRAMUSCULAR | Status: AC
  Administered 2019-08-01 – 2019-08-19 (×54): 5000 [IU] via SUBCUTANEOUS
  Filled 2019-08-01 (×56): qty 1

## 2019-08-01 MED ORDER — ASPIRIN 300 MG RE SUPP
300.0000 mg | RECTAL | Status: AC
  Administered 2019-08-01: 16:00:00 300 mg via RECTAL
  Filled 2019-08-01: qty 1

## 2019-08-01 MED ORDER — SODIUM CHLORIDE 0.9 % IV SOLN
1.5000 g | Freq: Two times a day (BID) | INTRAVENOUS | Status: DC
  Administered 2019-08-01 – 2019-08-04 (×8): 1.5 g via INTRAVENOUS
  Filled 2019-08-01 (×3): qty 1.5
  Filled 2019-08-01: qty 4
  Filled 2019-08-01: qty 1.5
  Filled 2019-08-01 (×2): qty 4
  Filled 2019-08-01 (×2): qty 1.5
  Filled 2019-08-01: qty 4

## 2019-08-01 NOTE — Procedures (Signed)
Procedure Note GEO SPAGNOLO [id number] 1971-06-12  Procedure: left radial arterial line Indications: need for frequent ABGs, labs  Procedure Details Consent: Risks of procedure as well as the alternatives and risks of each were explained to the (patient/caregiver).  Consent for procedure obtained. Time Out: Verified patient identification, verified procedure, site/side was marked, verified correct patient position, special equipment/implants available, medications/allergies/relevent history reviewed, required imaging and test results available.  Performed  Line placed with pulsatile flow, single attempt with Arrow 20g catheter after chloraprep skin prep allowed to dry. Biopatch and sterile dressing placed. Appropriate barrier precautions including sterile gloves. No complications  Bonna Gains, MD

## 2019-08-01 NOTE — Progress Notes (Signed)
[place] Progress Note Patient Name: Larry Banks DOB: 02-22-1971 MRN: [id number]   Date of Service  07/09/2019  HPI/Events of Note  ABG on 50%/PCV 25/Rate 21/P 8 = 7.497/37.6/58.0. However, Sat by oximetry = 96%.  eICU Interventions  Will order: 1. Increase PEEP to 10. 2. Repeat ABG at 5 AM.      Intervention Category Major Interventions: Acid-Base disturbance - evaluation and management;Respiratory failure - evaluation and management  Aaleyah Witherow Cornelia Copa 07/27/2019, 11:46 PM

## 2019-08-01 NOTE — Progress Notes (Signed)
Dario Guardian, RN called notified that the pt's HD tx has been moved to 08/02/19.

## 2019-08-01 NOTE — Progress Notes (Signed)
Pharmacy Antibiotic Note  ADWIN CARRAHER is a 48 y.o. male admitted on 07/16/2019 s/p cardiac arrest secondary to respiratory failure.  Pharmacy has been consulted for Unasyn dosing for concern of aspiration pneumonia.  Plan: Unasyn 1.5g IV q12h Patient is a peritoneal dialysis patient   Height: 5\' 6"  (167.6 cm) Weight: 204 lb 2.3 oz (92.6 kg) IBW/kg (Calculated) : 63.8  Temp (24hrs), Avg:98.2 F (36.8 C), Min:97.7 F (36.5 C), Max:99.1 F (37.3 C)  Recent Labs  Lab 07/05/2019 0400  WBC 20.2*  CREATININE 5.87*    Estimated Creatinine Clearance: 16.2 mL/min (A) (by C-G formula based on SCr of 5.87 mg/dL (H)).    Allergies  Allergen Reactions  . Shellfish Allergy Nausea And Vomiting    Antimicrobials this admission: Unasyn 8/29 >>     Microbiology results: 8/29 BCx: sent 8/29 TA: sent   Thank you for allowing pharmacy to be a part of this patient's care.  Marliss Czar [facility] 07/31/2019 11:05 AM

## 2019-08-01 NOTE — Progress Notes (Signed)
EEG complete - results pending 

## 2019-08-01 NOTE — Progress Notes (Signed)
  Echocardiogram 2D Echocardiogram has been performed.  Bobbye Charleston 07/24/2019, 11:21 AM

## 2019-08-01 NOTE — Consult Note (Signed)
Referring Provider: No ref. provider found Primary Care Physician:  Curlene Labrum, MD Primary Nephrologist:  Dr. Dr Venita Sheffield  Reason for Consultation: Medical management end-stage renal disease, maintenance of euvolemia, assessment of anemia secondary hyperparathyroidism  HPI: This is a very pleasant 48 year old gentleman who is a recent start to dialysis.  He has a history of hypertension, diabetes and now end-stage renal disease.  He is followed at [facility].  On 07/30/2019 underwent placement of peritoneal dialysis catheter.  He usually dialyzes Tuesday Thursday Saturday but received his last dialysis treatment Wednesday, 07/29/2019.  He presented to the emergency room at [facility] with shortness of breath.  He subsequently developed a cardiac arrest and required CPR intubation and was transferred to [facility] for further management.  Blood pressure 117/90 pulse 119 temperature 99.  O2 sats 93% FiO2 60% Urine output 350 cc 07/04/2019  pH 7.34 troponin II 38 urinalysis cloudy 100 mg/dL protein positive for bacteriuria 11-12 RBCs 11-12 WBCs. Sodium 130 potassium 4.3 chloride 98 CO2 21 BUN 39 creatinine 5.87 glucose 361 calcium 9.9 albumin 3.4 AST 32 ALT 19 bilirubin 0.3 WBC 20.2 hemoglobin 7.8 platelets 364.  Chest x-ray revealed bilateral interstitial infiltrates.  History reviewed. No pertinent past medical history.  History reviewed. No pertinent surgical history.  Prior to Admission medications   Medication Sig Start Date End Date Taking? Authorizing Provider  acetaminophen (TYLENOL) 500 MG tablet Take 1,000 mg by mouth every 6 (six) hours as needed for mild pain.   Yes [provider]  amLODipine (NORVASC) 10 MG tablet Take 10 mg by mouth daily. 06/04/19  Yes [provider]  aspirin EC 81 MG tablet Take 81 mg by mouth daily.   Yes [provider]  atorvastatin (LIPITOR) 20 MG tablet Take 20 mg by mouth daily.   Yes [provider]  B Complex-C (B-COMPLEX WITH VITAMIN C) tablet Take 1 tablet by mouth daily.   Yes [provider]  calcium carbonate (TUMS - DOSED IN MG ELEMENTAL CALCIUM) 500 MG chewable tablet Chew 2 tablets by mouth See admin instructions. Take 2 tablets three times daily with meals and at bedtime.   Yes [provider]  cholecalciferol (VITAMIN D3) 25 MCG (1000 UT) tablet Take 2,000 Units by mouth daily.   Yes [provider]  furosemide (LASIX) 80 MG tablet Take 80 mg by mouth See admin instructions. Take 80mg  twice daily on non-dialysis days. 07/08/19  Yes [provider]  JANUVIA 25 MG tablet Take 25 mg by mouth daily. 07/09/19  Yes [provider]  metolazone (ZAROXOLYN) 5 MG tablet Take 5 mg by mouth See admin instructions. Take one tablet by mouth thirty minutes prior to Furosemide on non-dialysis days. 07/18/19  Yes [provider]    Current Facility-Administered Medications  Medication Dose Route Frequency Provider Last Rate Last Dose  . 0.9 %  sodium chloride infusion  250 mL Intravenous PRN Parrett, Tammy S, NP      . 0.9 %  sodium chloride infusion   Intravenous Continuous Parrett, Tammy S, NP      . acetaminophen (TYLENOL) tablet 650 mg  650 mg Oral Q4H PRN Parrett, Tammy S, NP      . albuterol (PROVENTIL) (2.5 MG/3ML) 0.083% nebulizer solution 2.5 mg  2.5 mg Nebulization Q2H PRN Parrett, Tammy S, NP      . ampicillin-sulbactam (UNASYN) 1.5 g in sodium chloride 0.9 % 100 mL IVPB  1.5 g Intravenous Q12H Smith,  Darnelle Maffucci, MD      . aspirin suppository 300 mg  300 mg Rectal NOW Parrett, Tammy S, NP      . Chlorhexidine Gluconate Cloth 2 % PADS 6 each  6 each Topical Daily Rush Farmer, MD   6 each at 07/19/2019 1014  . Chlorhexidine Gluconate Cloth 2 % PADS 6 each  6 each Topical Q0600 Edrick Oh, MD      . heparin injection 5,000 Units  5,000 Units Subcutaneous Q8H Parrett, Tammy S, NP      . insulin aspart (novoLOG) injection 1-3  Units  1-3 Units Subcutaneous Q4H Parrett, Tammy S, NP      . ondansetron (ZOFRAN) injection 4 mg  4 mg Intravenous Q6H PRN Parrett, Tammy S, NP      . pantoprazole (PROTONIX) injection 40 mg  40 mg Intravenous QHS Parrett, Tammy S, NP      . phenylephrine (NEOSYNEPHRINE) 10-0.9 MG/250ML-% infusion  0-400 mcg/min Intravenous Titrated Tomi Bamberger, Iva, MD      . propofol (DIPRIVAN) 1000 MG/100ML infusion  5-80 mcg/kg/min Intravenous Continuous Rush Farmer, MD 16.67 mL/hr at 07/13/2019 0959 30 mcg/kg/min at 07/31/2019 0959  . sodium chloride flush (NS) 0.9 % injection 3 mL  3 mL Intravenous Q12H Parrett, Tammy S, NP      . sodium chloride flush (NS) 0.9 % injection 3 mL  3 mL Intravenous PRN Parrett, Tammy S, NP        Allergies as of 07/23/2019 - Review Complete 07/17/2019  Allergen Reaction Noted  . Shellfish allergy Nausea And Vomiting 07/17/2019    No family history on file.  Social History   Socioeconomic History  . Marital status: Married    Spouse name: Not on file  . Number of children: Not on file  . Years of education: Not on file  . Highest education level: Not on file  Occupational History  . Not on file  Social Needs  . Financial resource strain: Not on file  . Food insecurity    Worry: Not on file    Inability: Not on file  . Transportation needs    Medical: Not on file    Non-medical: Not on file  Tobacco Use  . Smoking status: Not on file  Substance and Sexual Activity  . Alcohol use: Not on file  . Drug use: Not on file  . Sexual activity: Not on file  Lifestyle  . Physical activity    Days per week: Not on file    Minutes per session: Not on file  . Stress: Not on file  Relationships  . Social Herbalist on phone: Not on file    Gets together: Not on file    Attends religious service: Not on file    Active member of club or organization: Not on file    Attends meetings of clubs or organizations: Not on file    Relationship status: Not on file   . Intimate partner violence    Fear of current or ex partner: Not on file    Emotionally abused: Not on file    Physically abused: Not on file    Forced sexual activity: Not on file  Other Topics Concern  . Not on file  Social History Narrative  . Not on file    Review of Systems: Patient intubated sedated  Physical Exam: Vital signs in last 24 hours: Temp:  [97.7 F (36.5 C)-99.1 F (37.3 C)] 99.1 F (37.3  C) (08/29 1000) Pulse Rate:  [101-119] 119 (08/29 1117) Resp:  [18-35] 35 (08/29 1117) BP: (93-114)/(64-88) 100/76 (08/29 1000) SpO2:  [92 %-100 %] 93 % (08/29 1117) FiO2 (%):  [50 %-100 %] 60 % (08/29 1117) Weight:  [92.6 kg] 92.6 kg (08/29 0809) Last BM Date: 07/14/2019    General:   Overweight gentleman. Head:  Normocephalic and atraumatic. Eyes:  Sclera clear, no icterus.  Conjunctival pallor Ears:  Normal auditory acuity. Nose:  No deformity, discharge,  or lesions. Mouth:  No deformity or lesions, dentition normal.  ET tube Neck:  Supple; no masses or thyromegaly. JVP not elevated   tunneled right IJ catheter Lungs: Diffuse rhonchorous breath sounds throughout bilateral Heart:  Regular rate and rhythm; no audible murmurs Abdomen: Distended nontender hypoactive bowel sounds Msk:  Symmetrical without gross deformities. Normal posture. Pulses:  No carotid, renal, femoral bruits. DP and PT symmetrical and equal Extremities:  Without clubbing 3+ pitting edema Neurologic:  Alert and  oriented x4;  grossly normal neurologically. Skin:  Intact without significant lesions or rashes.  Intake/Output from previous day: No intake/output data recorded. Intake/Output this shift: Total I/O In: 43.2 [I.V.:43.2] Out: 350 [Urine:350]  Lab Results: Recent Labs    07/23/2019 0400  WBC 20.2*  HGB 7.8*  HCT 28.1*  PLT 364   BMET Recent Labs    07/18/2019 0400  NA 130*  K 4.3  CL 98  CO2 21*  GLUCOSE 361*  BUN 39*  CREATININE 5.87*  CALCIUM 9.9   LFT Recent  Labs    07/05/2019 0400  PROT 6.3*  ALBUMIN 3.4*  AST 32  ALT 19  ALKPHOS 75  BILITOT 0.3   PT/INR No results for input(s): LABPROT, INR in the last 72 hours. Hepatitis Panel No results for input(s): HEPBSAG, HCVAB, HEPAIGM, HEPBIGM in the last 72 hours.  Studies/Results: Dg Chest Port 1 View  Result Date: 07/28/2019 CLINICAL DATA:  Post intubation.  Respiratory distress. EXAM: PORTABLE CHEST 1 VIEW COMPARISON:  Report of 09/11/2005. FINDINGS: Dialysis catheter tip at low SVC or superior caval/atrial junction. Endotracheal tube terminates 2.7 cm above carina. Nasogastric tube is poorly visualized distally, likely extends beyond the inferior aspect of the film. Overlying pacer/defibrillator. Cardiomegaly accentuated by AP portable technique. No definite pleural fluid. Moderate interstitial edema is asymmetric and greater on the right. Right greater than left perihilar airspace opacities. IMPRESSION: Support apparatus as detailed above. Interstitial and airspace opacities. Favor pulmonary edema. Given lack of significant pleural fluid, differential considerations include aspiration and infection, including atypical etiologies. Electronically Signed   By: Abigail Miyamoto M.D.   On: 07/12/2019 04:45    Assessment/Plan:  ESRD-recent start to dialysis.  Dialyzes usually Tuesday Thursday Saturday underwent peritoneal dialysis placement Thursday.  Had skipped a dialysis treatment Thursday.  He presented to the [facility] emergency room with shortness of breath.  Chest x-ray consistent with edema.  Peripheral edema on examination.  Dialysis catheter right IJ.  He also has a peritoneal dialysis catheter but has not started peritoneal dialysis.  This was only placed 07/30/2019.  Will schedule dialysis treatment 07/14/2019  Anemia check iron studies and start ESA.  Cardiac arrest does not appear to be a STEMI.  Patient being managed medically 2D echo pending.  Bones will follow calcium and phosphorus check  PTH  Volume/hypertension appears to be volume overloaded we will try to remove fluid with dialysis scheduled 07/28/2019  Respiratory failure.  Patient intubated possible pulmonary edema considering aspiration pneumonia.  Will follow  along with critical care medicine.  Diabetes mellitus as per primary team.   LOS: 0 Sherril Croon @TODAY @11 :31 AM

## 2019-08-01 NOTE — Progress Notes (Signed)
[place] Progress Note Patient Name: Larry Banks DOB: 05/09/1971 MRN: [id number]   Date of Service  07/27/2019  HPI/Events of Note  Previous blood gas was venous - Request for ABG.  eICU Interventions  Will order: 1. ABG now.      Intervention Category Major Interventions: Respiratory failure - evaluation and management  Sommer,Steven Eugene 07/15/2019, 8:02 PM

## 2019-08-01 NOTE — Progress Notes (Signed)
Called and updated wife, permission obtained for central line.

## 2019-08-01 NOTE — Progress Notes (Signed)
[place] Progress Note Patient Name: Larry Banks DOB: 12/18/1970 MRN: [id number]   Date of Service  07/14/2019  HPI/Events of Note  Multiple issues: 1. ABG on 50%/PCV 25/Rate 35/P 8 = 7.569/29.8/58.0 and 2. Hgb from ABG = 6.5.  eICU Interventions  Will order: 1. Decrease rate to 21. 2. Titrate FiO2 to keep sat >= 92%.  3. Repeat ABG at 10:45 PM. 4. CBC now.        Jibril Mcminn Cornelia Copa 07/19/2019, 9:33 PM

## 2019-08-01 NOTE — ED Notes (Signed)
Pt gagging on vent. No purposeful movement. Copious oral secretions suctioned. Wife at bedside trying to wake pt up, encouraged not to do so.

## 2019-08-01 NOTE — ED Notes (Signed)
[facility] arrived and assumed care of pt.

## 2019-08-01 NOTE — ED Triage Notes (Signed)
Pt arrived via ems for c/o sob; when ems arrived pt was alert and oriented and able to walk to the ems truck; upon arrival to ED; pt stood up in truck and had an incontinence of stool; pt then arrived to ED with agonal breathing; upon arrival to stretcher pt became unresponsive and pulseless and CPR started at [place]; pt was intubated with 8cm ETT at 0347; pt was given calcium chloride x 2, epinephrine x 3, D50, insulin 10 units, sodium bicarb during CPR; pt regained pulses at 0356

## 2019-08-01 NOTE — Progress Notes (Signed)
[place] Progress Note Patient Name: MALCOM SANANTONIO DOB: 11/11/1971 MRN: [id number]   Date of Service  07/29/2019  HPI/Events of Note  Anemia - Hgb = 6.7  eICU Interventions  Will transfuse 1 unit PRBC now.      Intervention Category Major Interventions: Other:  Lysle Dingwall 07/21/2019, 10:38 PM

## 2019-08-01 NOTE — ED Provider Notes (Signed)
[facility] EMERGENCY DEPARTMENT Provider Note   CSN: [id number] Arrival date & time: 08/03/2019  [id number]   Time seen 3:40 AM  History   Chief Complaint Chief Complaint  Patient presents with  . Respiratory Distress   Level 5 caveat for altered mental status  HPI Larry Banks is a 48 y.o. male.     HPI EMS reports patient is a recent dialysis patient.  He has a chest access in place and had peritoneal dialysis access placed yesterday.  They report they were called for shortness of breath.  He states the initial pulse ox was 88% and he would drop down to 84%.  They put him on a nonrebreather mask and it it did improve to 100%.  Patient initially refused transport however he had another transient drop of his pulse ox and finally was convinced to come to the ED.  He was supposed to have dialysis today.  EMS reports his blood pressure was 178 up to 200 in route in the ambulance.    PCP Burdine, Virgina Evener, MD Nephrology [facility]  History reviewed. No pertinent past medical history.  Patient Active Problem List   Diagnosis Date Noted  . Cardiac arrest ([place]) 07/22/2019    History reviewed. No pertinent surgical history.      Home Medications    Prior to Admission medications   Medication Sig Start Date End Date Taking? Authorizing Provider  atorvastatin (LIPITOR) 20 MG tablet Take 20 mg by mouth daily.    [provider]  chlorthalidone (HYGROTON) 25 MG tablet Take 25 mg by mouth daily.    [provider]    Family History No family history on file.  Social History Social History   Tobacco Use  . Smoking status: Not on file  Substance Use Topics  . Alcohol use: Not on file  . Drug use: Not on file  lives at home Lives with spouse   Allergies   Patient has no known allergies.   Review of Systems Review of Systems   Physical Exam Updated Vital Signs BP 114/84   Pulse (!) 101   Temp 97.7 F (36.5 C)   Resp 18   Ht 5\' 6"  (1.676  m)   SpO2 100%   BMI 32.96 kg/m   Physical Exam Vitals signs and nursing note reviewed.  Constitutional:      Comments: Not talking  HENT:     Head: Normocephalic and atraumatic.     Right Ear: External ear normal.     Left Ear: External ear normal.  Eyes:     Extraocular Movements: Extraocular movements intact.     Conjunctiva/sclera: Conjunctivae normal.     Pupils: Pupils are equal, round, and reactive to light.  Neck:     Musculoskeletal: Normal range of motion.  Cardiovascular:     Comments: No pulse, no heart tones, no rhythm on monitor Pulmonary:     Comments: No respiratory effort Abdominal:     General: There is distension.     Comments: abd pads on abdomen, edema of the abd wall  Musculoskeletal:        General: No deformity.  Skin:    General: Skin is warm and dry.  Neurological:     Comments: Unable to assess  Psychiatric:     Comments: Unable to assess    Cardiopulmonary Resuscitation (CPR) Procedure Note Directed/Performed by: Janice Norrie I personally directed ancillary staff and/or performed CPR in an effort to regain return of  spontaneous circulation and to maintain cardiac, neuro and systemic perfusion.    ED Treatments / Results  Labs (all labs ordered are listed, but only abnormal results are displayed) Results for orders placed or performed during the hospital encounter of 07/24/2019  SARS Coronavirus 2 [facility] order, Performed in [facility] hospital lab) Nasopharyngeal Nasopharyngeal Swab   Specimen: Nasopharyngeal Swab  Result Value Ref Range   SARS Coronavirus 2 NEGATIVE NEGATIVE  Comprehensive metabolic panel  Result Value Ref Range   Sodium 130 (L) 135 - 145 mmol/L   Potassium 4.3 3.5 - 5.1 mmol/L   Chloride 98 98 - 111 mmol/L   CO2 21 (L) 22 - 32 mmol/L   Glucose, Bld 361 (H) 70 - 99 mg/dL   BUN 39 (H) 6 - 20 mg/dL   Creatinine, Ser 5.87 (H) 0.61 - 1.24 mg/dL   Calcium 9.9 8.9 - 10.3 mg/dL   Total Protein 6.3 (L) 6.5 - 8.1 g/dL    Albumin 3.4 (L) 3.5 - 5.0 g/dL   AST 32 15 - 41 U/L   ALT 19 0 - 44 U/L   Alkaline Phosphatase 75 38 - 126 U/L   Total Bilirubin 0.3 0.3 - 1.2 mg/dL   GFR calc non Af Amer 10 (L) >60 mL/min   GFR calc Af Amer 12 (L) >60 mL/min   Anion gap 11 5 - 15  CBC with Differential  Result Value Ref Range   WBC 20.2 (H) 4.0 - 10.5 K/uL   RBC 2.77 (L) 4.22 - 5.81 MIL/uL   Hemoglobin 7.8 (L) 13.0 - 17.0 g/dL   HCT 28.1 (L) 39.0 - 52.0 %   MCV 101.4 (H) 80.0 - 100.0 fL   MCH 28.2 26.0 - 34.0 pg   MCHC 27.8 (L) 30.0 - 36.0 g/dL   RDW 14.2 11.5 - 15.5 %   Platelets 364 150 - 400 K/uL   nRBC 0.0 0.0 - 0.2 %   Neutrophils Relative % 80 %   Neutro Abs 16.0 (H) 1.7 - 7.7 K/uL   Lymphocytes Relative 14 %   Lymphs Abs 2.9 0.7 - 4.0 K/uL   Monocytes Relative 4 %   Monocytes Absolute 0.9 0.1 - 1.0 K/uL   Eosinophils Relative 1 %   Eosinophils Absolute 0.2 0.0 - 0.5 K/uL   Basophils Relative 0 %   Basophils Absolute 0.1 0.0 - 0.1 K/uL   Immature Granulocytes 1 %   Abs Immature Granulocytes 0.13 (H) 0.00 - 0.07 K/uL  Urinalysis, Routine w reflex microscopic  Result Value Ref Range   Color, Urine YELLOW YELLOW   APPearance CLOUDY (A) CLEAR   Specific Gravity, Urine 1.012 1.005 - 1.030   pH 5.0 5.0 - 8.0   Glucose, UA 50 (A) NEGATIVE mg/dL   Hgb urine dipstick MODERATE (A) NEGATIVE   Bilirubin Urine NEGATIVE NEGATIVE   Ketones, ur NEGATIVE NEGATIVE mg/dL   Protein, ur 100 (A) NEGATIVE mg/dL   Nitrite NEGATIVE NEGATIVE   Leukocytes,Ua NEGATIVE NEGATIVE   RBC / HPF 11-20 0 - 5 RBC/hpf   WBC, UA 11-20 0 - 5 WBC/hpf   Bacteria, UA FEW (A) NONE SEEN   Squamous Epithelial / LPF 0-5 0 - 5   Budding Yeast PRESENT    Sperm, UA PRESENT   Blood gas, arterial  Result Value Ref Range   FIO2 100.00    pH, Arterial 7.159 (LL) 7.350 - 7.450   pCO2 arterial 65.8 (HH) 32.0 - 48.0 mmHg   pO2, Arterial >552.0 (H)  83.0 - 108.0 mmHg   Bicarbonate 19.6 (L) 20.0 - 28.0 mmol/L   Acid-base deficit 5.1 (H) 0.0 -  2.0 mmol/L   O2 Saturation 99.9 %   Patient temperature 37.0    Allens test (pass/fail) PASS PASS  Troponin I (High Sensitivity)  Result Value Ref Range   Troponin I (High Sensitivity) 45 (H) <18 ng/L   Laboratory interpretation all normal except metabolic acidosis, glucose urea, possible UTI, Renal failure without hyperkalemia, leukocytosis after epinephrine, anemia, respiratory acidosis    EKG EKG Interpretation  Date/Time:  Saturday August 01 2019 04:01:07 EDT Ventricular Rate:  144 PR Interval:    QRS Duration: 112 QT Interval:  360 QTC Calculation: 554 R Axis:   88 Text Interpretation:  Sinus or ectopic atrial tachycardia Borderline intraventricular conduction delay ST depression, consider ischemia, diffuse lds Prolonged QT interval Baseline wander in lead(s) V5 No old tracing to compare Confirmed by Rolland Porter [phone]) on 07/13/2019 4:19:20 AM   #2  EKG Interpretation  Date/Time:  Saturday August 01 2019 04:56:24 EDT Ventricular Rate:  107 PR Interval:    QRS Duration: 103 QT Interval:  318 QTC Calculation: 425 R Axis:   73 Text Interpretation:  Sinus tachycardia Biatrial enlargement Borderline repolarization abnormality Baseline wander in lead(s) V2 Since last tracing of earlier today heart rate has decreased ST no longer depressed in Inferolateral leads Confirmed by Rolland Porter [phone]) on 07/15/2019 5:36:30 AM         Radiology Dg Chest Port 1 View  Result Date: 07/30/2019 CLINICAL DATA:  Post intubation.  Respiratory distress. EXAM: PORTABLE CHEST 1 VIEW COMPARISON:  Report of 09/11/2005. FINDINGS: Dialysis catheter tip at low SVC or superior caval/atrial junction. Endotracheal tube terminates 2.7 cm above carina. Nasogastric tube is poorly visualized distally, likely extends beyond the inferior aspect of the film. Overlying pacer/defibrillator. Cardiomegaly accentuated by AP portable technique. No definite pleural fluid. Moderate interstitial edema is asymmetric and  greater on the right. Right greater than left perihilar airspace opacities. IMPRESSION: Support apparatus as detailed above. Interstitial and airspace opacities. Favor pulmonary edema. Given lack of significant pleural fluid, differential considerations include aspiration and infection, including atypical etiologies. Electronically Signed   By: Abigail Miyamoto M.D.   On: 07/26/2019 04:45    Procedures Procedure Name: Intubation Date/Time: 07/24/2019 3:50 AM Performed by: Rolland Porter, MD Pre-anesthesia Checklist: Patient identified, Patient being monitored, Timeout performed, Emergency Drugs available and Suction available Oxygen Delivery Method: Ambu bag Preoxygenation: Pre-oxygenation with 100% oxygen Ventilation: Mask ventilation without difficulty Laryngoscope Size: Glidescope and 3 Grade View: Grade II Tube type: Subglottic suction tube Tube size: 8.0 mm Number of attempts: 1 Placement Confirmation: ETT inserted through vocal cords under direct vision,  Positive ETCO2 and Breath sounds checked- equal and bilateral Secured at: 25 cm Tube secured with: ETT holder Dental Injury: Teeth and Oropharynx as per pre-operative assessment  Comments: When I visualized his cords there was foamy white fluid coming out of it, after he was intubated he had a lot of foamy sometimes blood-tinged fluids in the tube.  He was suctioned by respiratory therapy.     .Critical Care Performed by: Rolland Porter, MD Authorized by: Rolland Porter, MD   Critical care provider statement:    Critical care time (minutes):  33   Critical care was necessary to treat or prevent imminent or life-threatening deterioration of the following conditions:  Cardiac failure, respiratory failure and renal failure   Critical care was time spent personally by me on the  following activities:  Discussions with consultants, evaluation of patient's response to treatment, examination of patient, obtaining history from patient or surrogate,  ordering and review of laboratory studies, ordering and review of radiographic studies, pulse oximetry and re-evaluation of patient's condition   (including critical care time)  Medications Ordered in ED Medications  phenylephrine (NEOSYNEPHRINE) 10-0.9 MG/250ML-% infusion (has no administration in time range)  midazolam (VERSED) 50 mg/50 mL (1 mg/mL) premix infusion (1.5 mg/hr Intravenous Rate/Dose Change 07/07/2019 0727)  furosemide (LASIX) 10 MG/ML injection (80 mg  Given 07/19/2019 0521)  calcium chloride injection 1 g (1 g Intravenous Given 07/06/2019 0348)  calcium chloride injection 1 g (1 g Intravenous Given 07/27/2019 0355)  EPINEPHrine (ADRENALIN) 1 MG/10ML injection 1 mg (1 mg Intravenous Given 07/29/2019 0348)  EPINEPHrine (ADRENALIN) 1 MG/10ML injection 1 mg (1 mg Intravenous Given 07/05/2019 0351)  EPINEPHrine (ADRENALIN) 1 MG/10ML injection 1 mg (1 mg Intravenous Given 07/30/2019 0354)  sodium bicarbonate injection 50 mEq (50 mEq Intravenous Given 07/26/2019 0350)  dextrose 50 % solution 50 mL (50 mLs Intravenous Given 07/18/2019 0352)  insulin aspart (novoLOG) injection 10 Units (10 Units Intravenous Given 07/31/2019 0353)  midazolam (VERSED) injection 2.5 mg (2.5 mg Intravenous Given 07/24/2019 0600)     Initial Impression / Assessment and Plan / ED Course  I have reviewed the triage vital signs and the nursing notes.  Pertinent labs & imaging results that were available during my care of the patient were reviewed by me and considered in my medical decision making (see chart for details).    As I entered the room nurses were still trying to triage patient and he was not breathing and he did not have a pulse.  CPR was started at 3:40 AM.  His initial rhythm on the monitor was asystole.  Patient was started on epinephrine, he was given IV calcium because he is a dialysis patient and concern was that he was hyperkalemic.  He is due to have dialysis this morning.  He also was given the hyperkalemic protocol  including 1 amp bicarb, 1 amp D50, insulin 10 units regular IV.  He was given epi x3.  IO was inserted by nursing staff while I intubated patient.  EMS said patient urinated before they transported him, he was given Lasix IV.  I also saw Lasix was on his medication order sheet.  3:50 AM on rhythm check patient was noted to have a rhythm however he did not have a pulse.  CPR was continued.  3:55 AM patient now has a pulse, his heart rate was 180.  4:02 AM heart rate now is 130, his blood pressure is 217/113.  Recheck at 4:40 AM heart rate 113, blood pressure 110/79  Recheck at 4:50 AM heart rate 109, blood pressure 93/68. Patient was started on Neo-Synephrine drip.  5 AM spoke to patient's sister and wife.  They were updated about his course and his current condition.  5:12 AM I spoke to the cardiology fellow, Dr. Margart Sickles, she has reviewed his EKGs and states right now he does not appear to be an acute STEMI and does not need to go to Cath Lab.  She would like the intensivist admit at the [facility] facility so they can do a cardiology consult.  We will not have a cardiologist here until August 31.  05:24-5:33 Dr Oletta Darter, accepts in transfer to [facility] ICU, Dr Nelda Marseille, admitting, after 7 am.  We discussed his ventilator settings, his respiratory rate has been 26-27, he recommends increasing  it to 35.  He was hypertensive however when I was in the room during our conversation his blood pressure was 103/93 with heart rate 113, for now the Neo-Synephrine was held.  Nurses report patient is started to have some twitching movements.  His blood pressure is now 123456 systolic.  He was given Versed bolus and then started on Versed drip for sedation.  5:50 AM wife is in the room, I informed her that he would be transferred to [facility] and be seen by the specialist there.  She is agreeable.  Patient's blood pressure has remained stable, a 6:30 AM his blood pressure was 114/84 with heart rate 101.  He is  still on the Versed drip.  The Neo-Synephrine did not have to be started.  7 AM Dr Thurnell Garbe. was informed of the patient and his condition, he is waiting for [facility] to transport him to [facility], his bed is ready.  He has been stable.  Final Clinical Impressions(s) / ED Diagnoses   Final diagnoses:  Cardiac arrest ([place])  Acute pulmonary edema ([place])  ESRD needing dialysis [facility])    Plan admission  Rolland Porter, MD, Barbette Or, MD 07/24/2019 [phone]

## 2019-08-01 NOTE — Progress Notes (Signed)
Initial Nutrition Assessment  INTERVENTION:   Tube feeding recommendations: -Vital HP @ 20 ml/hr via OGT, advance by 10 ml every 4 hours to goal rate of 50 ml/hr. -30 ml Prostat BID -This provides 1400 kcal, 135g protein and 1003 ml H2O  NUTRITION DIAGNOSIS:   Increased nutrient needs related to chronic illness(ESRD on dialysis) as evidenced by estimated needs.  GOAL:   Patient will meet greater than or equal to 90% of their needs  MONITOR:   Vent status, Labs, Weight trends, I & O's  REASON FOR ASSESSMENT:   Consult Assessment of nutrition requirement/status Recommendations for tube feeding  ASSESSMENT:   48 year old male with medical history significant for hypertension, PAD, hyperlipidemia, CKD-stage 5 presented to [facility] emergency room with respiratory distress/hypoxia on arrival to ER suffered a cardiac arrest with asystole with ROSC in 15 minutes.  **RD working remotely**  Patient with history of ESRD on HD. Pt recently had a PD catheter placed 8/27. Missed dialysis that day.  Per nephrology note today, plan is for patient to have dialysis today.  EDW is unknown at this time. Current weight: 204 lbs. Per care everywhere records, pt weighed 220 lbs on 8/27 at [facility]. Pt with severe LE edema per nursing documentation.  Patient is currently intubated on ventilator support MV: 15.8 L/min Temp (24hrs), Avg:98.8 F (37.1 C), Min:97.7 F (36.5 C), Max:99.1 F (37.3 C)  Propofol: 22.2 ml/hr -provides 586 fat kcals  I/Os:-415 ml since admit (10 hrs ago). UOP so far: 500 ml  Medications:  Labs reviewed: CBGs: 160 Low Na Elevated K, Phos Mg WNL  NUTRITION - FOCUSED PHYSICAL EXAM:  Unable to perform -working remotely.  Diet Order:   Diet Order            Diet NPO time specified  Diet effective now              EDUCATION NEEDS:   Not appropriate for education at this time  Skin:  Skin Assessment: Reviewed RN Assessment  Last BM:   8/29  Height:   Ht Readings from Last 1 Encounters:  07/11/2019 5\' 6"  (1.676 m)    Weight:   Wt Readings from Last 1 Encounters:  07/24/2019 92.6 kg    Ideal Body Weight:  64.5 kg  BMI:  Body mass index is 32.95 kg/m.  Estimated Nutritional Needs:   Kcal:  2173  Protein:  130-140g  Fluid:  Per MD  Clayton Bibles, MS, RD, LDN  Inpatient Clinical Dietitian Pager: [phone] After Hours Pager: [phone]

## 2019-08-01 NOTE — ED Notes (Signed)
Family in with pt at this time

## 2019-08-01 NOTE — Procedures (Addendum)
Patient Name: Larry Banks  MRN: [id number]  Epilepsy Attending: Lora Havens  Referring Physician/Provider: Rexene Edison, NP Date: 08/03/2019 Duration: 31.14 mins  Patient history: 48yo M with cardiac arrest. EEG to evaluate for seizure.   Level of alertness: comatose  AEDs during EEG study: propofol  Technical aspects: This EEG study was done with scalp electrodes positioned according to the 10-20 International system of electrode placement. Electrical activity was acquired at a sampling rate of 500Hz  and reviewed with a high frequency filter of 70Hz  and a low frequency filter of 1Hz . EEG data were recorded continuously and digitally stored.   DESCRIPTION: EEG showed continuous generalized 2-3 hz low voltage delta slowing as well as generalized suppression. EEG was not reactive to stimulation. Hyperventilation and photic stimulation were not performed.  IMPRESSION: This study is suggestive of profound diffuse encephalopathy,could be secondary to anoxic/hypoxic brain injury and sedation. No seizures or epileptiform discharges were seen throughout the recording.  Lora Havens, MD

## 2019-08-01 NOTE — ED Notes (Signed)
Report given to [facility]. 

## 2019-08-01 NOTE — H&P (Signed)
PULMONARY / CRITICAL CARE MEDICINE   NAME:  Larry Banks, MRN:  [id number], DOB:  1971-02-26, LOS: 0 ADMISSION DATE:  07/18/2019, CONSULTATION DATE:  07/17/2019  REFERRING MD:  EDP Dr. Tomi Bamberger  , CHIEF COMPLAINT:  Cardiac Arrest/Resp Failure   BRIEF HISTORY:    48 year old male with medical history significant for hypertension, PAD, hyperlipidemia, CKD-stage 5 presented to [facility] emergency room with respiratory distress/hypoxia on arrival to ER suffered a cardiac arrest with asystole with ROSC in 15 minutes.  Patient transferred to [facility] ICU for [name] admission HISTORY OF PRESENT ILLNESS   48 year old male with medical history significant for hypertension, PAD ,hyperlipidemia, CKD Stage 5 developed shortness of breath at home.  EMS was contacted patient was notable to have hypoxia with O2 saturations in the 80s.  Patient was placed on nonrebreather and transported to the emergency room.  Patient does have CKD Stage 5 .  Is followed at [facility].  Recently had peritoneal dialysis catheter placed on August 27. Has not started Dialysis yet.  On arrival to the emergency room patient suffered a cardiac arrest with asystole.  He received CPR x15 minutes.  He was given the hyperkalemic protocol due to his underlying chronic kidney disease with 1 amp bicarb, 1 amp D50, insulin 10 units regular IV.  He received epi x3.  He had ROSC after 15 minutes.  He did require very brief Neo-Synephrine for hypotension.  He had significant agitation was started on sedation with Versed and then transition to Diprivan .  Patient was transported to [facility] ICU for [name] to admit.  Patient is sedated on vent. Withdraws to pain does not follow commands.  Initial ABG showed severe acidosis with pH of 7.15.  Post intubation ABG showed improvement with pH at 7.3.,  PCO2 45, PO2 152, O2 saturation 99.  Serum creatinine 5.8.  Glucose 361.  Potassium 4.3.  LFTs okay.  Troponin I was elevated.  WBC 20.2 K, hemoglobin 7.8,  platelet 364. SARS CoV2 NEG.  Chest x-ray showed interstitial and airspace opacities favoring pulmonary edema greater on the right.  SIGNIFICANT PAST MEDICAL HISTORY   Hypertension, hyperlipidemia, PAD, chronic kidney disease stage V  SIGNIFICANT EVENTS:  8/29 Cardiac arrest, CPR x15 minutes, epi x3  STUDIES:   CT head 8/29 >  8/29 Echo >   CULTURES:  8/29 BC >> 8/29 Sputum >>  8/29 COVID 19 >> NEG   ANTIBIOTICS:    LINES/TUBES:  Peritoneal cath 8/27 (PTA )  R HD cath ? >>  ------- 8/29 ETT >>   CONSULTANTS:  Renal   SUBJECTIVE:  Sedated on vent   CONSTITUTIONAL: BP 114/84   Pulse (!) 101   Temp 97.7 F (36.5 C)   Resp 18   Ht 5\' 6"  (1.676 m)   Wt 92.6 kg   SpO2 99%   BMI 32.95 kg/m   No intake/output data recorded.     Vent Mode: PRVC FiO2 (%):  [50 %-100 %] 100 % Set Rate:  [18 bmp-35 bmp] 35 bmp Vt Set:  [510 mL-550 mL] 510 mL PEEP:  [5 cmH20] 5 cmH20 Plateau Pressure:  [32 cmH20-36 cmH20] 32 cmH20  PHYSICAL EXAM: General: Ill-appearing male sedated on vent Neuro: Sedated, withdraws to pain not following commands HEENT: [place]/AT, MMM, ETT Cardiovascular: Sinus tach, no MRG Lungs: Coarse breath bilaterally Abdomen: Mild distention, hypoactive bowel sounds, peritoneal dialysis catheter, dressing in place Musculoskeletal: Intact, moving all extremities Skin: Intact without rash  RESOLVED PROBLEM LIST  ASSESSMENT AND PLAN   Acute Hypoxic Respiratory Failure in setting of cardiac arrest  Acute Pulmonary Edema +/- Aspiration PNA  -CXR with R>L interstial opacities c/w pulmonary edema +/- aspiration   Plan  -Full vent support -change to Pressure Control  -VAP  -tr ABG  -tr CXR  -Assess daily SBT /Wean  -Begin empiric abx w/ Unasyn    Cardiac Arrest 8/29 w/ ROSC (CPR x 59min )  HTN /PAD  Elevated Troponin (after CPR )  Hypotension -b/p soft   Plan  Tr Troponin and LA  Hold Home HTN meds for now  Begin pressors if not able to maintain  MAP >65  Begin 36 degree TTM   CKD Stage 5/ESRD   (Followed at [facility] )  -Peritoneal Cath placed on 8/27 . Unclear last HD session   Plan Consult Renal  Monitor electrolytes /replace as indicated.    Acute encephalopathy   Plan  Sedation protocol  Check  CT head  Check EEG   Leukocytosis / R>L Pulmonary opacities ? Aspiration event   Plan  Begin empiric Unasyn  Check PCT  Pan culture with [facility] /resp cultures   DM -Hyperglycemia   Plan  ICU SSI protocol   Anemia  No sign of active bleeding   Plan  Tr cbc  Tranx per Protocol Hbg <7      SUMMARY OF TODAY'S PLAN:    Best Practice / Goals of Care / Disposition.   DVT PROPHYLAXIS: HEP  SUP: PPI  NUTRITION:  TF  MOBILITY:BR  GOALS OF CARE: Full Code  FAMILY DISCUSSIONS: Wife updated 8/29  DISPOSITION : ICU   LABS  Glucose Recent Labs  Lab 07/05/2019 0759 07/17/2019 0954  GLUCAP 238* 199*    BMET Recent Labs  Lab 07/05/2019 0400  NA 130*  K 4.3  CL 98  CO2 21*  BUN 39*  CREATININE 5.87*  GLUCOSE 361*    Liver Enzymes Recent Labs  Lab 07/21/2019 0400  AST 32  ALT 19  ALKPHOS 75  BILITOT 0.3  ALBUMIN 3.4*    Electrolytes Recent Labs  Lab 07/12/2019 0400  CALCIUM 9.9    CBC Recent Labs  Lab 07/14/2019 0400  WBC 20.2*  HGB 7.8*  HCT 28.1*  PLT 364    ABG Recent Labs  Lab 07/27/2019 0455 07/14/2019 0755  PHART 7.159* 7.336*  PCO2ART 65.8* 45.5  PO2ART >552.0* 152*    Coag's No results for input(s): APTT, INR in the last 168 hours.  Sepsis Markers No results for input(s): LATICACIDVEN, PROCALCITON, O2SATVEN in the last 168 hours.  Cardiac Enzymes No results for input(s): TROPONINI, PROBNP in the last 168 hours.  PAST MEDICAL HISTORY :   He  has no past medical history on file.  PAST SURGICAL HISTORY:  He  has no past surgical history on file.  Allergies  Allergen Reactions  . Shellfish Allergy Nausea And Vomiting    No current facility-administered medications on  file prior to encounter.    Current Outpatient Medications on File Prior to Encounter  Medication Sig  . acetaminophen (TYLENOL) 500 MG tablet Take 1,000 mg by mouth every 6 (six) hours as needed for mild pain.  Marland Kitchen amLODipine (NORVASC) 10 MG tablet Take 10 mg by mouth daily.  Marland Kitchen aspirin EC 81 MG tablet Take 81 mg by mouth daily.  Marland Kitchen atorvastatin (LIPITOR) 20 MG tablet Take 20 mg by mouth daily.  . B Complex-C (B-COMPLEX WITH VITAMIN C) tablet Take 1 tablet by mouth  daily.  . calcium carbonate (TUMS - DOSED IN MG ELEMENTAL CALCIUM) 500 MG chewable tablet Chew 2 tablets by mouth See admin instructions. Take 2 tablets three times daily with meals and at bedtime.  . cholecalciferol (VITAMIN D3) 25 MCG (1000 UT) tablet Take 2,000 Units by mouth daily.  . furosemide (LASIX) 80 MG tablet Take 80 mg by mouth See admin instructions. Take 80mg  twice daily on non-dialysis days.  Marland Kitchen JANUVIA 25 MG tablet Take 25 mg by mouth daily.  . metolazone (ZAROXOLYN) 5 MG tablet Take 5 mg by mouth See admin instructions. Take one tablet by mouth thirty minutes prior to Furosemide on non-dialysis days.    FAMILY HISTORY:   His family history is not on file.  SOCIAL HISTORY:  He  unable to obtain on vent/sedated   REVIEW OF SYSTEMS:    Unable to obtain as sedated on vent

## 2019-08-01 NOTE — Procedures (Signed)
Procedure Note MERON BOSTROM [id number] 07/03/1971  Procedure: triple lumen, central line placement Indications: cardiac arrest, need for pressors, monitoring  Procedure Details Consent: Risks of procedure as well as the alternatives and risks of each were explained to the (patient/caregiver).  Consent for procedure obtained. Time Out: Verified patient identification, verified procedure, site/side was marked, verified correct patient position, special equipment/implants available, medications/allergies/relevent history reviewed, required imaging and test results available.  Performed 7 Fr TLC placed using seldinger technique at left subclavian.  Each port aspirated with easy return of blood, and flushed with NS.  Sterile caps placed. Biopatch and sterile dressing placed aseptically.   CXR to verify placement pending.   Sterile precautions including body drape, sterile hat, gown, gloves used. No apparent complications.   Evaluation Hemodynamic Status: BP stable throughout; O2 sats:  Chest X-ray ordered to verify placement.  CXR: pending.  Bonna Gains, MD PhD 07/14/2019 12:31 PM

## 2019-08-02 ENCOUNTER — Inpatient Hospital Stay (HOSPITAL_COMMUNITY): Payer: 59

## 2019-08-02 LAB — POCT I-STAT 7, (LYTES, BLD GAS, ICA,H+H)
Acid-Base Excess: 2 mmol/L (ref 0.0–2.0)
Acid-Base Excess: 2 mmol/L (ref 0.0–2.0)
Acid-Base Excess: 2 mmol/L (ref 0.0–2.0)
Bicarbonate: 26.6 mmol/L (ref 20.0–28.0)
Bicarbonate: 27.3 mmol/L (ref 20.0–28.0)
Bicarbonate: 28.3 mmol/L — ABNORMAL HIGH (ref 20.0–28.0)
Calcium, Ion: 1.07 mmol/L — ABNORMAL LOW (ref 1.15–1.40)
Calcium, Ion: 1.08 mmol/L — ABNORMAL LOW (ref 1.15–1.40)
Calcium, Ion: 1.07 mmol/L — ABNORMAL LOW (ref 1.15–1.40)
HCT: 27 % — ABNORMAL LOW (ref 39.0–52.0)
HCT: 28 % — ABNORMAL LOW (ref 39.0–52.0)
HCT: 27 % — ABNORMAL LOW (ref 39.0–52.0)
Hemoglobin: 9.2 g/dL — ABNORMAL LOW (ref 13.0–17.0)
Hemoglobin: 9.5 g/dL — ABNORMAL LOW (ref 13.0–17.0)
Hemoglobin: 9.2 g/dL — ABNORMAL LOW (ref 13.0–17.0)
O2 Saturation: 96 %
O2 Saturation: 92 %
O2 Saturation: 89 %
Patient temperature: 36.4
Patient temperature: 37.7
Patient temperature: 98.2
Potassium: 4.6 mmol/L (ref 3.5–5.1)
Potassium: 4.3 mmol/L (ref 3.5–5.1)
Potassium: 4 mmol/L (ref 3.5–5.1)
Sodium: 137 mmol/L (ref 135–145)
Sodium: 135 mmol/L (ref 135–145)
Sodium: 136 mmol/L (ref 135–145)
TCO2: 28 mmol/L (ref 22–32)
TCO2: 29 mmol/L (ref 22–32)
TCO2: 30 mmol/L (ref 22–32)
pCO2 arterial: 38.6 mmHg (ref 32.0–48.0)
pCO2 arterial: 44.1 mmHg (ref 32.0–48.0)
pCO2 arterial: 51.4 mmHg — ABNORMAL HIGH (ref 32.0–48.0)
pH, Arterial: 7.444 (ref 7.350–7.450)
pH, Arterial: 7.403 (ref 7.350–7.450)
pH, Arterial: 7.347 — ABNORMAL LOW (ref 7.350–7.450)
pO2, Arterial: 73 mmHg — ABNORMAL LOW (ref 83.0–108.0)
pO2, Arterial: 65 mmHg — ABNORMAL LOW (ref 83.0–108.0)
pO2, Arterial: 60 mmHg — ABNORMAL LOW (ref 83.0–108.0)

## 2019-08-02 LAB — COMPREHENSIVE METABOLIC PANEL
ALT: 22 U/L (ref 0–44)
AST: 33 U/L (ref 15–41)
Albumin: 2.9 g/dL — ABNORMAL LOW (ref 3.5–5.0)
Alkaline Phosphatase: 66 U/L (ref 38–126)
Anion gap: 14 (ref 5–15)
BUN: 52 mg/dL — ABNORMAL HIGH (ref 6–20)
CO2: 27 mmol/L (ref 22–32)
Calcium: 8.6 mg/dL — ABNORMAL LOW (ref 8.9–10.3)
Chloride: 96 mmol/L — ABNORMAL LOW (ref 98–111)
Creatinine, Ser: 6.58 mg/dL — ABNORMAL HIGH (ref 0.61–1.24)
GFR calc Af Amer: 10 mL/min — ABNORMAL LOW (ref >60)
GFR calc non Af Amer: 9 mL/min — ABNORMAL LOW (ref >60)
Glucose, Bld: 120 mg/dL — ABNORMAL HIGH (ref 70–99)
Potassium: 4.7 mmol/L (ref 3.5–5.1)
Sodium: 137 mmol/L (ref 135–145)
Total Bilirubin: 0.6 mg/dL (ref 0.3–1.2)
Total Protein: 5.7 g/dL — ABNORMAL LOW (ref 6.5–8.1)

## 2019-08-02 LAB — GLUCOSE, CAPILLARY
Glucose-Capillary: 117 mg/dL — ABNORMAL HIGH (ref 70–99)
Glucose-Capillary: 118 mg/dL — ABNORMAL HIGH (ref 70–99)
Glucose-Capillary: 118 mg/dL — ABNORMAL HIGH (ref 70–99)
Glucose-Capillary: 120 mg/dL — ABNORMAL HIGH (ref 70–99)
Glucose-Capillary: 139 mg/dL — ABNORMAL HIGH (ref 70–99)
Glucose-Capillary: 143 mg/dL — ABNORMAL HIGH (ref 70–99)
Glucose-Capillary: 155 mg/dL — ABNORMAL HIGH (ref 70–99)
Glucose-Capillary: 95 mg/dL (ref 70–99)

## 2019-08-02 LAB — TYPE AND SCREEN
ABO/RH(D): A POS
Antibody Screen: NEGATIVE
Unit division: 0

## 2019-08-02 LAB — BPAM RBC
Blood Product Expiration Date: 202009182359
ISSUE DATE / TIME: 202008292341
Unit Type and Rh: 6200

## 2019-08-02 LAB — CBC
HCT: 26.1 % — ABNORMAL LOW (ref 39.0–52.0)
Hemoglobin: 8.3 g/dL — ABNORMAL LOW (ref 13.0–17.0)
MCH: 29 pg (ref 26.0–34.0)
MCHC: 31.8 g/dL (ref 30.0–36.0)
MCV: 91.3 fL (ref 80.0–100.0)
Platelets: 250 10*3/uL (ref 150–400)
RBC: 2.86 MIL/uL — ABNORMAL LOW (ref 4.22–5.81)
RDW: 14 % (ref 11.5–15.5)
WBC: 10.8 10*3/uL — ABNORMAL HIGH (ref 4.0–10.5)
nRBC: 0 % (ref 0.0–0.2)

## 2019-08-02 LAB — MAGNESIUM: Magnesium: 2.2 mg/dL (ref 1.7–2.4)

## 2019-08-02 LAB — BASIC METABOLIC PANEL
Anion gap: 14 (ref 5–15)
BUN: 35 mg/dL — ABNORMAL HIGH (ref 6–20)
CO2: 26 mmol/L (ref 22–32)
Calcium: 8.3 mg/dL — ABNORMAL LOW (ref 8.9–10.3)
Chloride: 96 mmol/L — ABNORMAL LOW (ref 98–111)
Creatinine, Ser: 4.81 mg/dL — ABNORMAL HIGH (ref 0.61–1.24)
GFR calc Af Amer: 15 mL/min — ABNORMAL LOW (ref >60)
GFR calc non Af Amer: 13 mL/min — ABNORMAL LOW (ref >60)
Glucose, Bld: 153 mg/dL — ABNORMAL HIGH (ref 70–99)
Potassium: 4 mmol/L (ref 3.5–5.1)
Sodium: 136 mmol/L (ref 135–145)

## 2019-08-02 LAB — HIV ANTIBODY (ROUTINE TESTING W REFLEX): HIV Screen 4th Generation wRfx: NONREACTIVE

## 2019-08-02 LAB — PHOSPHORUS: Phosphorus: 5.6 mg/dL — ABNORMAL HIGH (ref 2.5–4.6)

## 2019-08-02 LAB — LEGIONELLA PNEUMOPHILA SEROGP 1 UR AG: L. pneumophila Serogp 1 Ur Ag: NEGATIVE

## 2019-08-02 LAB — PARATHYROID HORMONE, INTACT (NO CA): PTH: 93 pg/mL — ABNORMAL HIGH (ref 15–65)

## 2019-08-02 MED ORDER — SODIUM CHLORIDE 0.9 % IV SOLN: 100.0000 mL | INTRAVENOUS | Status: DC | PRN

## 2019-08-02 MED ORDER — ACETAMINOPHEN 160 MG/5ML PO SOLN
650.0000 mg | Freq: Four times a day (QID) | ORAL | Status: DC | PRN
  Administered 2019-08-02 – 2019-08-15 (×7): 650 mg via ORAL
  Filled 2019-08-02 (×6): qty 20.3

## 2019-08-02 MED ORDER — SODIUM CHLORIDE 0.9% FLUSH: 10.0000 mL | INTRAVENOUS | Status: DC | PRN

## 2019-08-02 MED ORDER — ACETAMINOPHEN 160 MG/5ML PO SOLN
650.0000 mg | Freq: Four times a day (QID) | ORAL | Status: DC
  Administered 2019-08-03 – 2019-08-13 (×40): 650 mg
  Filled 2019-08-02 (×39): qty 20.3

## 2019-08-02 MED ORDER — LIDOCAINE-PRILOCAINE 2.5-2.5 % EX CREA
1.0000 "application " | TOPICAL_CREAM | CUTANEOUS | Status: DC | PRN
  Filled 2019-08-02: qty 5

## 2019-08-02 MED ORDER — CHLORHEXIDINE GLUCONATE CLOTH 2 % EX PADS: 6.0000 | MEDICATED_PAD | Freq: Every day | CUTANEOUS | Status: DC

## 2019-08-02 MED ORDER — ALTEPLASE 2 MG IJ SOLR: 2.0000 mg | Freq: Once | INTRAMUSCULAR | Status: DC | PRN

## 2019-08-02 MED ORDER — PRO-STAT SUGAR FREE PO LIQD
30.0000 mL | Freq: Two times a day (BID) | ORAL | Status: DC
  Administered 2019-08-02 – 2019-08-03 (×3): 30 mL
  Filled 2019-08-02 (×3): qty 30

## 2019-08-02 MED ORDER — FENTANYL 2500MCG IN NS 250ML (10MCG/ML) PREMIX INFUSION
0.0000 ug/h | INTRAVENOUS | Status: DC
  Administered 2019-08-02: 21:00:00 50 ug/h via INTRAVENOUS
  Filled 2019-08-02: qty 250

## 2019-08-02 MED ORDER — PENTAFLUOROPROP-TETRAFLUOROETH EX AERO: 1.0000 "application " | INHALATION_SPRAY | CUTANEOUS | Status: DC | PRN

## 2019-08-02 MED ORDER — SODIUM CHLORIDE 0.9% FLUSH
10.0000 mL | Freq: Two times a day (BID) | INTRAVENOUS | Status: DC
  Administered 2019-08-02: 10 mL
  Administered 2019-08-05: 10:00:00 30 mL
  Administered 2019-08-05: 10 mL
  Administered 2019-08-06: 22:00:00 20 mL
  Administered 2019-08-06: 10:00:00 10 mL

## 2019-08-02 MED ORDER — VITAL HIGH PROTEIN PO LIQD
1000.0000 mL | ORAL | Status: DC
  Administered 2019-08-02 – 2019-08-03 (×2): 1000 mL

## 2019-08-02 MED ORDER — HEPARIN SODIUM (PORCINE) 1000 UNIT/ML DIALYSIS
1000.0000 [IU] | INTRAMUSCULAR | Status: DC | PRN
  Administered 2019-08-08: 13:00:00 1000 [IU] via INTRAVENOUS_CENTRAL
  Filled 2019-08-02 (×3): qty 1

## 2019-08-02 MED ORDER — LIDOCAINE HCL (PF) 1 % IJ SOLN: 5.0000 mL | INTRAMUSCULAR | Status: DC | PRN

## 2019-08-02 NOTE — Progress Notes (Signed)
PULMONARY / CRITICAL CARE MEDICINE   NAME:  Larry Banks, MRN:  [id number], DOB:  10-01-1971, LOS: 1 ADMISSION DATE:  07/19/2019, CONSULTATION DATE:  07/15/2019  REFERRING MD:  EDP Dr. Tomi Banks  , CHIEF COMPLAINT:  Cardiac Arrest/Resp Failure   BRIEF HISTORY:     48 year old male with medical history significant for hypertension, PAD ,hyperlipidemia, CKD Stage 5 developed shortness of breath at home.  EMS was contacted patient was notable to have hypoxia with O2 saturations in the 80s.  Patient was placed on nonrebreather and transported to the emergency room.  Patient does have CKD Stage 5 .  Is followed at [facility].  Recently had peritoneal dialysis catheter placed on August 27. Has not started Dialysis yet.  On arrival to the emergency room patient suffered a cardiac arrest with asystole.  He received CPR x15 minutes.  He was given the hyperkalemic protocol due to his underlying chronic kidney disease with 1 amp bicarb, 1 amp D50, insulin 10 units regular IV.  He received epi x3.  He had ROSC after 15 minutes.  He did require very brief Neo-Synephrine for hypotension.  He had significant agitation was started on sedation with Versed and then transition to Diprivan .  Patient was transported to [facility] ICU for [name] to admit.  Patient is sedated on vent. Withdraws to pain does not follow commands.  Initial ABG showed severe acidosis with pH of 7.15.  Post intubation ABG showed improvement with pH at 7.3.,  PCO2 45, PO2 152, O2 saturation 99.  Serum creatinine 5.8.  Glucose 361.  Potassium 4.3.  LFTs okay.  Troponin I was elevated.  WBC 20.2 K, hemoglobin 7.8, platelet 364. SARS CoV2 NEG.  Chest x-ray showed interstitial and airspace opacities favoring pulmonary edema greater on the right.  SIGNIFICANT PAST MEDICAL HISTORY   Hypertension, hyperlipidemia, PAD, chronic kidney disease stage V  SIGNIFICANT EVENTS:  8/26-  TTS HD - last 07/29/2019 Larry Banks (old tunneled Rt HD cah)  8/27 - PD  cath - for a planned change from iHD to PD 8/29 Cardiac arrest, CPR x15 minutes, epi x3. Per renal CXR- c/w pulmonary edema  STUDIES:   CT head 8/29 >  8/29 Echo > ef 45% 0 8/29 - EEG  CULTURES:  8/29 BC >> 8/29 Sputum >>  8/29 COVID 19 >> NEG   ANTIBIOTICS:    LINES/TUBES:  Peritoneal cath 8/27 (PTA )  R HD cath ? >>  ------- 8/29 ETT >>  8/29 =- CVL 8/29 a line  CONSULTANTS:  Renal   SUBJECTIVE:     08/02/2019 -  S/p 1 unit prbc for hgb 6.7. echo yesterday - ef 45%. On 36C hypothermia protocol. Rewarming to normothermia later today. Not on pressors. On dipriva gtt. On vent. On HD    CONSTITUTIONAL: BP (!) 197/105 (BP Location: Right Arm)   Pulse 95   Temp 98.2 F (36.8 C) (Bladder)   Resp 20   Ht 5\' 6"  (1.676 m)   Wt 92.6 kg   SpO2 100%   BMI 32.95 kg/m   I/O last 3 completed shifts: In: 3499.4 [I.V.:881.1; Blood:670; IV Piggyback:1948.4] Out: 3650 [Urine:2375; Emesis/NG output:1275]  CVP:  [7 mmHg-27 mmHg] 22 mmHg  Vent Mode: PCV FiO2 (%):  [40 %-100 %] 40 % Set Rate:  [21 bmp-35 bmp] 21 bmp Vt Set:  [510 mL] 510 mL PEEP:  [5 cmH20-10 cmH20] 10 cmH20 Plateau Pressure:  [16 cmH20-33 cmH20] 16 cmH20   General Appearance:  Looks criticall ill OBESE - + Head:  Normocephalic, without obvious abnormality, atraumatic Eyes:  PERRL - yes, conjunctiva/corneas - muddy     Ears:  Normal external ear canals, both ears Nose:  G tube - no Throat:  ETT TUBE - yes , OG tube - yes Neck:  Supple,  No enlargement/tenderness/nodules Lungs: Clear to auscultation bilaterally, Ventilator   Synchrony - yes Heart:  S1 and S2 normal, no murmur, CVP - x.  Pressors - yes Abdomen:  Soft, no masses, no organomegaly Genitalia / Rectal:  Not done Extremities:  Extremities- intact Skin:  ntact in exposed areas . Sacral area - not xamined Neurologic:  Sedation - diprivan gtt -> RASS - -4      RESOLVED PROBLEM LIST   ASSESSMENT AND PLAN   Acute Hypoxic Respiratory  Failure in setting of cardiac arrest  Acute Pulmonary Edema +/- Aspiration PNA  -CXR with R>L interstial opacities c/w pulmonary edema +/- aspiration    08/02/2019 - remains on vent. Does Not meet SBT criterial  Plan  -Full vent support -change to Pressure Control  - VAP Bundle   Cardiac Arrest 8/29 w/ ROSC (CPR x 17min )  HTN /PAD  Elevated Troponin (after CPR )  Hypotension -b/p soft   08/02/2019 - not on pressors  Plan  Hold Home HTN meds for now  Begin pressors if not able to maintain MAP >75   CKD Stage 5/ESRD   (Followed at [facility] )  - on TTS scheduld -Peritoneal Cath placed on 8/27 to replace old R IJ  08/02/2019 - doing HD via [place] Per renal  Acute encephalopathy - CT head at admit  No evidence of ischemia  08/02/2019 = EEG done. On arctic sun 36C protocol. For rewarming later today/   Plan  Sedation protocol  With diprivan   Likely sepsis from aspiration (high PCT)  08/02/2019 - not on pressors  Plan  Continue c Unasyn  Check PCT  Await Pan culture with [facility] /resp cultures   DM -Hyperglycemia   Plan  ICU SSI protocol   Anemia  No sign of active bleeding   Plan  - PRBC for hgb </= 8/0 gm%  Due to cardiac arrest        SUMMARY OF TODAY'S PLAN:   08/02/2019 - rewarming later today  Best Practice / Goals of Care / Disposition.   DVT PROPHYLAXIS: HEP  SUP: PPI  NUTRITION:  TF  MOBILITY:BR  GOALS OF CARE: Full Code  FAMILY DISCUSSIONS: Wife updated 8/29  DISPOSITION : ICU      ATTESTATION & SIGNATURE   The patient Larry Banks is critically ill with multiple organ systems failure and requires high complexity decision making for assessment and support, frequent evaluation and titration of therapies, application of advanced monitoring technologies and extensive interpretation of multiple databases.   Critical Care Time devoted to patient care services described in this note is  30  Minutes. This time reflects time of care  of this signee Dr Brand Males. This critical care time does not reflect procedure time, or teaching time or supervisory time of PA/NP/Med student/Med Resident etc but could involve care discussion time     Dr. Brand Males, M.D., [facility]C.P Pulmonary and Critical Care Medicine Staff Physician [place] [facility] Pager: [phone], If no answer or between  15:00h - 7:00h: call [phone]  08/02/2019 7:56 AM    LABS  Anti-infectives (From admission, onward)   Start     Dose/Rate Route Frequency Ordered Stop   07/15/2019 1115  ampicillin-sulbactam (UNASYN) 1.5 g in sodium chloride 0.9 % 100 mL IVPB     1.5 g 200 mL/hr over 30 Minutes Intravenous Every 12 hours 07/06/2019 1104         PULMONARY Recent Labs  Lab 07/11/2019 0755 07/21/2019 1235 07/31/2019 1427 07/23/2019 2027 07/09/2019 2306 08/02/19 0538  PHART 7.336* 7.373  --  7.569* 7.497* 7.444  PCO2ART 45.5 42.5  --  29.8* 37.6 38.6  PO2ART 152* 49.0*  --  58.0* 58.0* 73.0*  HCO3 23.2 24.8 27.1 27.5 29.4* 26.6  TCO2  --  26 29 28 31 28   O2SAT 99.1 83.0 54.0 95.0 93.0 96.0    CBC Recent Labs  Lab 07/26/2019 1127  07/23/2019 2145 07/20/2019 2306 08/02/19 0436 08/02/19 0538  HGB 7.6*   < > 6.7* 6.5* 8.3* 9.2*  HCT 25.1*   < > 20.8* 19.0* 26.1* 27.0*  WBC 14.2*  --  9.7  --  10.8*  --   PLT 287  --  227  --  250  --    < > = values in this interval not displayed.    COAGULATION Recent Labs  Lab 07/15/2019 1127  INR 1.1    CARDIAC  No results for input(s): TROPONINI in the last 168 hours. No results for input(s): PROBNP in the last 168 hours.   CHEMISTRY Recent Labs  Lab 07/31/2019 0400 07/19/2019 1127  07/16/2019 2027 08/02/2019 2145 07/26/2019 2306 08/02/19 0436 08/02/19 0538  NA 130*  --    < > 135 136 136 137 137  K 4.3  --    < > 4.5 4.1 4.1 4.7 4.6  CL 98  --   --   --  97*  --  96*  --   CO2 21*  --   --   --  26  --  27  --   GLUCOSE 361*  --   --   --  97  --   120*  --   BUN 39*  --   --   --  48*  --  52*  --   CREATININE 5.87*  --   --   --  6.49*  --  6.58*  --   CALCIUM 9.9  --   --   --  8.6*  --  8.6*  --   MG  --  2.1  --   --   --   --  2.2  --   PHOS  --  5.4*  --   --   --   --  5.6*  --    < > = values in this interval not displayed.   Estimated Creatinine Clearance: 14.5 mL/min (A) (by C-G formula based on SCr of 6.58 mg/dL (H)).   LIVER Recent Labs  Lab 07/24/2019 0400 07/04/2019 1127 08/02/19 0436  AST 32  --  33  ALT 19  --  22  ALKPHOS 75  --  66  BILITOT 0.3  --  0.6  PROT 6.3*  --  5.7*  ALBUMIN 3.4*  --  2.9*  INR  --  1.1  --      INFECTIOUS Recent Labs  Lab 07/31/2019 1127 07/07/2019 1336  LATICACIDVEN 2.9* 1.3  PROCALCITON 39.92  --      ENDOCRINE CBG (last 3)  Recent Labs    07/12/2019 2304 08/02/19 [id number]  08/02/19 0733  GLUCAP 95 120* 118*         IMAGING x48h  - image(s) personally visualized  -   highlighted in bold Ct Abdomen Pelvis Wo Contrast  Result Date: 07/31/2019 CLINICAL DATA:  48 year old male with abdominal pain and distension. Patient with recent cardiac arrest. Peritoneal dialysis catheter placement . EXAM: CT ABDOMEN AND PELVIS WITHOUT CONTRAST TECHNIQUE: Multidetector CT imaging of the abdomen and pelvis was performed following the standard protocol without IV contrast. COMPARISON:  None. FINDINGS: Please note that parenchymal abnormalities may be missed without intravenous contrast. Lower chest: Cardiomegaly is identified. Moderate bilateral LOWER lung atelectasis/consolidation noted with small bilateral pleural effusions. Ground-glass opacities within the aerated LOWER lungs are nonspecific. Hepatobiliary: The liver and gallbladder are unremarkable. No biliary dilatation. Pancreas: Unremarkable Spleen: Unremarkable Adrenals/Urinary Tract: Punctate nonobstructing bilateral renal calculi are identified as well as a LEFT renal cyst. There is no evidence of hydronephrosis or obstructing  urinary calculi. A Foley catheter is present within the bladder. Fullness of bilateral adrenal glands noted without discrete nodule/mass. Stomach/Bowel: An NG tube is present with tip in the mid stomach. There is no evidence of bowel obstruction, bowel wall thickening or bowel inflammatory changes. The appendix is normal. Vascular/Lymphatic: Aortic atherosclerosis. No enlarged abdominal or pelvic lymph nodes. Reproductive: Prostate is unremarkable. Other: A peritoneal dialysis catheter is identified with tip in the RIGHT pelvis. No ascites, pneumoperitoneum or focal collection. Mild diffuse subcutaneous edema is noted. Musculoskeletal: No acute or suspicious bony abnormalities. IMPRESSION: 1. Peritoneal dialysis catheter with tip in the RIGHT pelvis. No complicating features. 2. Moderate bilateral LOWER lung atelectasis/consolidation and small bilateral pleural effusions. Mild subcutaneous edema. 3. Nonspecific diffuse bilateral ground-glass opacities within the visualized aerated LOWER lungs, question edema versus infection. 4. Punctate nonobstructing bilateral renal calculi. 5. Cardiomegaly 6.  Aortic Atherosclerosis (ICD10-I70.0). Electronically Signed   By: Margarette Canada M.D.   On: 07/07/2019 13:56   Ct Head Wo Contrast  Result Date: 07/14/2019 CLINICAL DATA:  Cardiac arrest. EXAM: CT HEAD WITHOUT CONTRAST TECHNIQUE: Contiguous axial images were obtained from the base of the skull through the vertex without intravenous contrast. COMPARISON:  None. FINDINGS: Brain: No evidence of generalized brain atrophy or generalized brain swelling. No sign of old or acute focal infarction, mass lesion, hemorrhage, hydrocephalus or extra-axial collection. Vascular: No abnormal vascular finding. Skull: Normal Sinuses/Orbits: Mild sinus mucosal thickening. Tiny right maxillary sinus air-fluid level. Orbits negative. Other: None IMPRESSION: No focal brain insult. No evidence of diffuse hypoxic ischemic injury. Normal CT at this  time. Electronically Signed   By: [name] Chimes M.D.   On: 07/08/2019 13:50   Dg Chest Port 1 View  Result Date: 08/02/2019 CLINICAL DATA:  Ventilator support EXAM: PORTABLE CHEST 1 VIEW COMPARISON:  07/06/2019 FINDINGS: Endotracheal tube is 1 cm above the carina. Nasogastric tube enters the stomach. Left subclavian central line tip in the SVC at the azygos level. Right internal jugular central line tip in the right atrium. Diffuse pulmonary airspace filling persist consistent with edema and/or pneumonia. IMPRESSION: Lines and tubes unchanged in well-positioned. Diffuse pulmonary opacity consistent with edema and/or pneumonia. Electronically Signed   By: [name] Chimes M.D.   On: 08/02/2019 07:38   Dg Chest Port 1 View  Result Date: 07/05/2019 CLINICAL DATA:  Central line placement EXAM: PORTABLE CHEST 1 VIEW COMPARISON:  07/13/2019, 4:27 a.m. FINDINGS: Interval placement of a left subclavian vascular catheter, tip projecting over the superior SVC. Other support apparatus including endotracheal tube, esophagogastric tube, and  right neck large bore multi lumen vascular catheter is unchanged. Otherwise unchanged examination featuring heterogeneous and interstitial bilateral pulmonary opacity, most conspicuous in the right midlung, consistent with edema or infection, and cardiomegaly. IMPRESSION: Interval placement of a left subclavian vascular catheter, tip projecting over the superior SVC. Electronically Signed   By: Eddie Candle M.D.   On: 07/28/2019 12:49   Dg Chest Port 1 View  Result Date: 07/27/2019 CLINICAL DATA:  Post intubation.  Respiratory distress. EXAM: PORTABLE CHEST 1 VIEW COMPARISON:  Report of 09/11/2005. FINDINGS: Dialysis catheter tip at low SVC or superior caval/atrial junction. Endotracheal tube terminates 2.7 cm above carina. Nasogastric tube is poorly visualized distally, likely extends beyond the inferior aspect of the film. Overlying pacer/defibrillator. Cardiomegaly accentuated by AP  portable technique. No definite pleural fluid. Moderate interstitial edema is asymmetric and greater on the right. Right greater than left perihilar airspace opacities. IMPRESSION: Support apparatus as detailed above. Interstitial and airspace opacities. Favor pulmonary edema. Given lack of significant pleural fluid, differential considerations include aspiration and infection, including atypical etiologies. Electronically Signed   By: Abigail Miyamoto M.D.   On: 07/25/2019 04:45  ;a

## 2019-08-02 NOTE — Progress Notes (Signed)
Brief Nutrition Note  Consult received for enteral/tube feeding initiation and management.  Adult Enteral Nutrition Protocol initiated. Full assessment to follow.  Admitting Dx: Cardiac arrest ([place]) [I46.9] Acute pulmonary edema ([place]) [J81.0] ESRD needing dialysis ([place]) [N18.6, Z99.2]  Body mass index is 32.95 kg/m. Pt meets criteria for obesity class I based on current BMI.  Labs:  Recent Labs  Lab 07/06/2019 0400 07/23/2019 1127  07/06/2019 2145 07/27/2019 2306 08/02/19 0436 08/02/19 0538  NA 130*  --    < > 136 136 137 137  K 4.3  --    < > 4.1 4.1 4.7 4.6  CL 98  --   --  97*  --  96*  --   CO2 21*  --   --  26  --  27  --   BUN 39*  --   --  48*  --  52*  --   CREATININE 5.87*  --   --  6.49*  --  6.58*  --   CALCIUM 9.9  --   --  8.6*  --  8.6*  --   MG  --  2.1  --   --   --  2.2  --   PHOS  --  5.4*  --   --   --  5.6*  --   GLUCOSE 361*  --   --  97  --  120*  --    < > = values in this interval not displayed.    [place], [place], [place] Pager [phone] After Hours Pager

## 2019-08-02 NOTE — Progress Notes (Addendum)
[facility] ROUNDING NOTE   Subjective:   This is a very pleasant 48 year old gent with recent start to dialysis on 07/30/2019 underwent placement of peritoneal dialysis catheter.  He dialyzes Tuesday Thursday Saturday with [facility].  He has a history of hypertension and diabetes.  He presented to [facility] emergency room with shortness of breath and subsequently developed a cardiac arrest plan CPR and intubation.  He was transferred to [facility]  EEG performed by Dr. Hortense Ramal showed hypoxic encephalopathic changes.  Patient was seen during his dialysis treatment he is currently undergoing dialysis 08/02/2019  Blood pressure 92/82 pulse 95 temperature 98.2 O2 sats 100% FiO2 40%  Sodium 136 potassium 4.7 chloride 96 CO2 27 BUN 52 creatinine 6.58 glucose 120 calcium 8.6 phosphorus 5.6 magnesium 2.2 AST 33 ALT 22 WBC 10.8 hemoglobin 8.3 platelets 250  Diffuse pulmonary infiltrates consistent with edema or pneumonia.  2D echo showed decreased EF with hypokinesis 45%   Objective:  Vital signs in last 24 hours:  Temp:  [95.7 F (35.4 C)-99.1 F (37.3 C)] 98.2 F (36.8 C) (08/30 0730) Pulse Rate:  [72-119] 102 (08/30 0845) Resp:  [17-35] 19 (08/30 0845) BP: (96-197)/(66-106) 166/85 (08/30 0845) SpO2:  [89 %-100 %] 100 % (08/30 0730) Arterial Line BP: (98-159)/(59-102) 149/96 (08/30 0600) FiO2 (%):  [40 %-100 %] 40 % (08/30 0718)  Weight change:  Filed Weights   07/23/2019 0809  Weight: 92.6 kg    Intake/Output: I/O last 3 completed shifts: In: 3499.4 [I.V.:881.1; Blood:670; IV Piggyback:1948.4] Out: 3650 [Urine:2375; Emesis/NG output:1275]   Intake/Output this shift:  Total I/O In: -  Out: 350 [Urine:350]  Looks critically ill obese gentleman CVS-good rate and rhythm no murmurs audible CVP not elevated RS-clear to auscultations ventilator ABD-hypoactive bowel sounds EXT- no edema   Basic Metabolic Panel: Recent Labs  Lab 07/31/2019 0400  07/08/2019 1127  07/26/2019 2027 07/10/2019 2145 07/23/2019 2306 08/02/19 0436 08/02/19 0538  NA 130*  --    < > 135 136 136 137 137  K 4.3  --    < > 4.5 4.1 4.1 4.7 4.6  CL 98  --   --   --  97*  --  96*  --   CO2 21*  --   --   --  26  --  27  --   GLUCOSE 361*  --   --   --  97  --  120*  --   BUN 39*  --   --   --  48*  --  52*  --   CREATININE 5.87*  --   --   --  6.49*  --  6.58*  --   CALCIUM 9.9  --   --   --  8.6*  --  8.6*  --   MG  --  2.1  --   --   --   --  2.2  --   PHOS  --  5.4*  --   --   --   --  5.6*  --    < > = values in this interval not displayed.    Liver Function Tests: Recent Labs  Lab 07/26/2019 0400 08/02/19 0436  AST 32 33  ALT 19 22  ALKPHOS 75 66  BILITOT 0.3 0.6  PROT 6.3* 5.7*  ALBUMIN 3.4* 2.9*   No results for input(s): LIPASE, AMYLASE in the last 168 hours. No results for input(s): AMMONIA in the last 168 hours.  CBC: Recent Labs  Lab 07/11/2019 0400 07/26/2019 1127  07/24/2019 2027 07/20/2019 2145 07/11/2019 2306 08/02/19 0436 08/02/19 0538  WBC 20.2* 14.2*  --   --  9.7  --  10.8*  --   NEUTROABS 16.0*  --   --   --  7.6  --   --   --   HGB 7.8* 7.6*   < > 6.5* 6.7* 6.5* 8.3* 9.2*  HCT 28.1* 25.1*   < > 19.0* 20.8* 19.0* 26.1* 27.0*  MCV 101.4* 93.7  --   --  90.4  --  91.3  --   PLT 364 287  --   --  227  --  250  --    < > = values in this interval not displayed.    Cardiac Enzymes: No results for input(s): CKTOTAL, CKMB, CKMBINDEX, TROPONINI in the last 168 hours.  BNP: Invalid input(s): POCBNP  CBG: Recent Labs  Lab 08/03/2019 1509 07/11/2019 1943 07/06/2019 2304 08/02/19 0437 08/02/19 0733  GLUCAP 155* 107* 95 120* 118*    Microbiology: Results for orders placed or performed during the hospital encounter of 07/21/2019  SARS Coronavirus 2 [facility] order, Performed in [facility] hospital lab) Nasopharyngeal Nasopharyngeal Swab     Status: None   Collection Time: 07/04/2019  3:59 AM   Specimen: Nasopharyngeal Swab  Result Value  Ref Range Status   SARS Coronavirus 2 NEGATIVE NEGATIVE Final    Comment: (NOTE) If result is NEGATIVE SARS-CoV-2 target nucleic acids are NOT DETECTED. The SARS-CoV-2 RNA is generally detectable in upper and lower  respiratory specimens during the acute phase of infection. The lowest  concentration of SARS-CoV-2 viral copies this assay can detect is 250  copies / mL. A negative result does not preclude SARS-CoV-2 infection  and should not be used as the sole basis for treatment or other  patient management decisions.  A negative result may occur with  improper specimen collection / handling, submission of specimen other  than nasopharyngeal swab, presence of viral mutation(s) within the  areas targeted by this assay, and inadequate number of viral copies  (<250 copies / mL). A negative result must be combined with clinical  observations, patient history, and epidemiological information. If result is POSITIVE SARS-CoV-2 target nucleic acids are DETECTED. The SARS-CoV-2 RNA is generally detectable in upper and lower  respiratory specimens dur ing the acute phase of infection.  Positive  results are indicative of active infection with SARS-CoV-2.  Clinical  correlation with patient history and other diagnostic information is  necessary to determine patient infection status.  Positive results do  not rule out bacterial infection or co-infection with other viruses. If result is PRESUMPTIVE POSTIVE SARS-CoV-2 nucleic acids MAY BE PRESENT.   A presumptive positive result was obtained on the submitted specimen  and confirmed on repeat testing.  While 2019 novel coronavirus  (SARS-CoV-2) nucleic acids may be present in the submitted sample  additional confirmatory testing may be necessary for epidemiological  and / or clinical management purposes  to differentiate between  SARS-CoV-2 and other Sarbecovirus currently known to infect humans.  If clinically indicated additional testing with an  alternate test  methodology [phone]) is advised. The SARS-CoV-2 RNA is generally  detectable in upper and lower respiratory sp ecimens during the acute  phase of infection. The expected result is Negative. Fact Sheet for Patients:  StrictlyIdeas.no Fact Sheet for Healthcare Providers: BankingDealers.co.za This test is not yet approved or cleared by the [place] FDA and  has been authorized for detection and/or diagnosis of SARS-CoV-2 by FDA under an Emergency Use Authorization (EUA).  This EUA will remain in effect (meaning this test can be used) for the duration of the COVID-19 declaration under Section 564(b)(1) of the Act, 21 U.S.C. section 360bbb-3(b)(1), unless the authorization is terminated or revoked sooner. Performed at [facility], [address] [place], [place] [address]   Culture, blood (routine x 2)     Status: None (Preliminary result)   Collection Time: 07/18/2019 11:17 AM   Specimen: BLOOD RIGHT HAND  Result Value Ref Range Status   Specimen Description BLOOD RIGHT HAND  Final   Special Requests   Final    BOTTLES DRAWN AEROBIC AND ANAEROBIC Blood Culture results may not be optimal due to an inadequate volume of blood received in culture bottles   Culture   Final    NO GROWTH <12 HOURS Performed at [place] [facility] [address], [place], [place] [address]    Report Status PENDING  Incomplete  Culture, blood (routine x 2)     Status: None (Preliminary result)   Collection Time: 07/26/2019 11:44 AM   Specimen: BLOOD  Result Value Ref Range Status   Specimen Description BLOOD SITE NOT SPECIFIED  Final   Special Requests   Final    AEROBIC BOTTLE ONLY Blood Culture results may not be optimal due to an inadequate volume of blood received in culture bottles   Culture   Final    NO GROWTH <12 HOURS Performed at [facility], [place] [address], [place], [place] [address]    Report Status PENDING  Incomplete   Culture, respiratory (tracheal aspirate)     Status: None (Preliminary result)   Collection Time: 08/02/2019  1:36 PM   Specimen: Tracheal Aspirate; Respiratory  Result Value Ref Range Status   Specimen Description TRACHEAL ASPIRATE  Final   Special Requests NONE  Final   Gram Stain   Final    RARE WBC PRESENT, PREDOMINANTLY PMN RARE GRAM NEGATIVE RODS RARE GRAM POSITIVE COCCI IN PAIRS Performed at [facility], [place] [address], [place], [place] [address]    Culture PENDING  Incomplete   Report Status PENDING  Incomplete  MRSA PCR Screening     Status: None   Collection Time: 07/10/2019  1:36 PM   Specimen: Nasopharyngeal  Result Value Ref Range Status   MRSA by PCR NEGATIVE NEGATIVE Final    Comment:        The GeneXpert MRSA Assay (FDA approved for NASAL specimens only), is one component of a comprehensive MRSA colonization surveillance program. It is not intended to diagnose MRSA infection nor to guide or monitor treatment for MRSA infections. Performed at [facility], [place] [address], [place], [place] [address]     Coagulation Studies: Recent Labs    07/18/2019 1127  LABPROT 13.9  INR 1.1    Urinalysis: Recent Labs    07/17/2019 0453  COLORURINE YELLOW  LABSPEC 1.012  PHURINE 5.0  GLUCOSEU 50*  HGBUR MODERATE*  BILIRUBINUR NEGATIVE  KETONESUR NEGATIVE  PROTEINUR 100*  NITRITE NEGATIVE  LEUKOCYTESUR NEGATIVE      Imaging: Ct Abdomen Pelvis Wo Contrast  Result Date: 07/20/2019 CLINICAL DATA:  48 year old male with abdominal pain and distension. Patient with recent cardiac arrest. Peritoneal dialysis catheter placement . EXAM: CT ABDOMEN AND PELVIS WITHOUT CONTRAST TECHNIQUE: Multidetector CT imaging of the abdomen and pelvis was performed following the standard protocol without IV contrast.  COMPARISON:  None. FINDINGS: Please note that parenchymal abnormalities may be missed without intravenous contrast. Lower chest: Cardiomegaly is identified.  Moderate bilateral LOWER lung atelectasis/consolidation noted with small bilateral pleural effusions. Ground-glass opacities within the aerated LOWER lungs are nonspecific. Hepatobiliary: The liver and gallbladder are unremarkable. No biliary dilatation. Pancreas: Unremarkable Spleen: Unremarkable Adrenals/Urinary Tract: Punctate nonobstructing bilateral renal calculi are identified as well as a LEFT renal cyst. There is no evidence of hydronephrosis or obstructing urinary calculi. A Foley catheter is present within the bladder. Fullness of bilateral adrenal glands noted without discrete nodule/mass. Stomach/Bowel: An NG tube is present with tip in the mid stomach. There is no evidence of bowel obstruction, bowel wall thickening or bowel inflammatory changes. The appendix is normal. Vascular/Lymphatic: Aortic atherosclerosis. No enlarged abdominal or pelvic lymph nodes. Reproductive: Prostate is unremarkable. Other: A peritoneal dialysis catheter is identified with tip in the RIGHT pelvis. No ascites, pneumoperitoneum or focal collection. Mild diffuse subcutaneous edema is noted. Musculoskeletal: No acute or suspicious bony abnormalities. IMPRESSION: 1. Peritoneal dialysis catheter with tip in the RIGHT pelvis. No complicating features. 2. Moderate bilateral LOWER lung atelectasis/consolidation and small bilateral pleural effusions. Mild subcutaneous edema. 3. Nonspecific diffuse bilateral ground-glass opacities within the visualized aerated LOWER lungs, question edema versus infection. 4. Punctate nonobstructing bilateral renal calculi. 5. Cardiomegaly 6.  Aortic Atherosclerosis (ICD10-I70.0). Electronically Signed   By: Margarette Canada M.D.   On: 07/26/2019 13:56   Ct Head Wo Contrast  Result Date: 08/03/2019 CLINICAL DATA:  Cardiac arrest. EXAM: CT HEAD WITHOUT CONTRAST TECHNIQUE: Contiguous axial images were obtained from the base of the skull through the vertex without intravenous contrast. COMPARISON:  None.  FINDINGS: Brain: No evidence of generalized brain atrophy or generalized brain swelling. No sign of old or acute focal infarction, mass lesion, hemorrhage, hydrocephalus or extra-axial collection. Vascular: No abnormal vascular finding. Skull: Normal Sinuses/Orbits: Mild sinus mucosal thickening. Tiny right maxillary sinus air-fluid level. Orbits negative. Other: None IMPRESSION: No focal brain insult. No evidence of diffuse hypoxic ischemic injury. Normal CT at this time. Electronically Signed   By: [name] Chimes M.D.   On: 07/19/2019 13:50   Dg Chest Port 1 View  Result Date: 08/02/2019 CLINICAL DATA:  Ventilator support EXAM: PORTABLE CHEST 1 VIEW COMPARISON:  07/27/2019 FINDINGS: Endotracheal tube is 1 cm above the carina. Nasogastric tube enters the stomach. Left subclavian central line tip in the SVC at the azygos level. Right internal jugular central line tip in the right atrium. Diffuse pulmonary airspace filling persist consistent with edema and/or pneumonia. IMPRESSION: Lines and tubes unchanged in well-positioned. Diffuse pulmonary opacity consistent with edema and/or pneumonia. Electronically Signed   By: [name] Chimes M.D.   On: 08/02/2019 07:38   Dg Chest Port 1 View  Result Date: 07/22/2019 CLINICAL DATA:  Central line placement EXAM: PORTABLE CHEST 1 VIEW COMPARISON:  07/20/2019, 4:27 a.m. FINDINGS: Interval placement of a left subclavian vascular catheter, tip projecting over the superior SVC. Other support apparatus including endotracheal tube, esophagogastric tube, and right neck large bore multi lumen vascular catheter is unchanged. Otherwise unchanged examination featuring heterogeneous and interstitial bilateral pulmonary opacity, most conspicuous in the right midlung, consistent with edema or infection, and cardiomegaly. IMPRESSION: Interval placement of a left subclavian vascular catheter, tip projecting over the superior SVC. Electronically Signed   By: Eddie Candle M.D.   On:  07/17/2019 12:49   Dg Chest Port 1 View  Result Date: 07/24/2019 CLINICAL DATA:  Post intubation.  Respiratory distress. EXAM:  PORTABLE CHEST 1 VIEW COMPARISON:  Report of 09/11/2005. FINDINGS: Dialysis catheter tip at low SVC or superior caval/atrial junction. Endotracheal tube terminates 2.7 cm above carina. Nasogastric tube is poorly visualized distally, likely extends beyond the inferior aspect of the film. Overlying pacer/defibrillator. Cardiomegaly accentuated by AP portable technique. No definite pleural fluid. Moderate interstitial edema is asymmetric and greater on the right. Right greater than left perihilar airspace opacities. IMPRESSION: Support apparatus as detailed above. Interstitial and airspace opacities. Favor pulmonary edema. Given lack of significant pleural fluid, differential considerations include aspiration and infection, including atypical etiologies. Electronically Signed   By: Abigail Miyamoto M.D.   On: 07/07/2019 04:45     Medications:   . sodium chloride Stopped (07/17/2019 1242)  . sodium chloride 10 mL/hr at 08/02/19 0500  . sodium chloride    . sodium chloride    . ampicillin-sulbactam (UNASYN) IV 1.5 g (08/02/2019 2248)  . phenylephrine (NEO-SYNEPHRINE) Adult infusion    . propofol (DIPRIVAN) infusion 75.054 mcg/kg/min (08/02/19 0802)   . acetaminophen  650 mg Per Tube Q4H  . chlorhexidine gluconate (MEDLINE KIT)  15 mL Mouth Rinse BID  . Chlorhexidine Gluconate Cloth  6 each Topical Daily  . Chlorhexidine Gluconate Cloth  6 each Topical Q0600  . Chlorhexidine Gluconate Cloth  6 each Topical Daily  . heparin  5,000 Units Subcutaneous Q8H  . insulin aspart  1-3 Units Subcutaneous Q4H  . mouth rinse  15 mL Mouth Rinse 10 times per day  . pantoprazole (PROTONIX) IV  40 mg Intravenous QHS  . sodium chloride flush  10-40 mL Intracatheter Q12H  . sodium chloride flush  3 mL Intravenous Q12H   sodium chloride, sodium chloride, sodium chloride, albuterol, alteplase,  fentaNYL (SUBLIMAZE) injection, heparin, lidocaine (PF), lidocaine-prilocaine, ondansetron (ZOFRAN) IV, pentafluoroprop-tetrafluoroeth, sodium chloride flush, sodium chloride flush  Assessment/ Plan:   End-stage renal disease recent start dialysis usually dialyzes Tuesday Thursday Saturday.  Had presented to the emergency room at [facility] with shortness of breath subsequently developed cardiac arrest.  He was scheduled dialysis 07/12/2019 but he is receiving dialysis 08/02/2019.  Due to high dialysis volumes.  Patient will be scheduled again for dialysis 08/04/2019  Anemia hemoglobin 6.5 will probably need blood transfusion will defer to critical care medicine service.  Cardiac arrest diminished EF with diffuse hypokinesis  Bones PTH pending  Respiratory failure pulmonary edema versus aspiration followed by critical care medicine  Diabetes mellitus as per primary team.  Altered mental status hypoxic encephalopathy.  Followed by primary service   LOS: [place] _0 _1 :58 AM

## 2019-08-03 ENCOUNTER — Inpatient Hospital Stay (HOSPITAL_COMMUNITY): Payer: 59

## 2019-08-03 ENCOUNTER — Encounter (HOSPITAL_COMMUNITY): Payer: Self-pay

## 2019-08-03 LAB — GLUCOSE, CAPILLARY
Glucose-Capillary: 159 mg/dL — ABNORMAL HIGH (ref 70–99)
Glucose-Capillary: 128 mg/dL — ABNORMAL HIGH (ref 70–99)
Glucose-Capillary: 132 mg/dL — ABNORMAL HIGH (ref 70–99)
Glucose-Capillary: 172 mg/dL — ABNORMAL HIGH (ref 70–99)
Glucose-Capillary: 125 mg/dL — ABNORMAL HIGH (ref 70–99)

## 2019-08-03 LAB — BASIC METABOLIC PANEL
Anion gap: 16 — ABNORMAL HIGH (ref 5–15)
Anion gap: 16 — ABNORMAL HIGH (ref 5–15)
BUN: 42 mg/dL — ABNORMAL HIGH (ref 6–20)
BUN: 43 mg/dL — ABNORMAL HIGH (ref 6–20)
CO2: 24 mmol/L (ref 22–32)
CO2: 24 mmol/L (ref 22–32)
Calcium: 8 mg/dL — ABNORMAL LOW (ref 8.9–10.3)
Calcium: 8 mg/dL — ABNORMAL LOW (ref 8.9–10.3)
Chloride: 95 mmol/L — ABNORMAL LOW (ref 98–111)
Chloride: 96 mmol/L — ABNORMAL LOW (ref 98–111)
Creatinine, Ser: 5.11 mg/dL — ABNORMAL HIGH (ref 0.61–1.24)
Creatinine, Ser: 5.54 mg/dL — ABNORMAL HIGH (ref 0.61–1.24)
GFR calc Af Amer: 13 mL/min — ABNORMAL LOW (ref >60)
GFR calc Af Amer: 14 mL/min — ABNORMAL LOW (ref >60)
GFR calc non Af Amer: 11 mL/min — ABNORMAL LOW (ref >60)
GFR calc non Af Amer: 12 mL/min — ABNORMAL LOW (ref >60)
Glucose, Bld: 129 mg/dL — ABNORMAL HIGH (ref 70–99)
Glucose, Bld: 164 mg/dL — ABNORMAL HIGH (ref 70–99)
Potassium: 3.7 mmol/L (ref 3.5–5.1)
Potassium: 4.5 mmol/L (ref 3.5–5.1)
Sodium: 135 mmol/L (ref 135–145)
Sodium: 136 mmol/L (ref 135–145)

## 2019-08-03 LAB — POCT I-STAT 7, (LYTES, BLD GAS, ICA,H+H)
Acid-Base Excess: 3 mmol/L — ABNORMAL HIGH (ref 0.0–2.0)
Bicarbonate: 29.5 mmol/L — ABNORMAL HIGH (ref 20.0–28.0)
Bicarbonate: 27.5 mmol/L (ref 20.0–28.0)
Calcium, Ion: 1.07 mmol/L — ABNORMAL LOW (ref 1.15–1.40)
Calcium, Ion: 1 mmol/L — ABNORMAL LOW (ref 1.15–1.40)
HCT: 29 % — ABNORMAL LOW (ref 39.0–52.0)
HCT: 24 % — ABNORMAL LOW (ref 39.0–52.0)
Hemoglobin: 9.9 g/dL — ABNORMAL LOW (ref 13.0–17.0)
Hemoglobin: 8.2 g/dL — ABNORMAL LOW (ref 13.0–17.0)
O2 Saturation: 98 %
O2 Saturation: 99 %
Potassium: 4.3 mmol/L (ref 3.5–5.1)
Potassium: 4.1 mmol/L (ref 3.5–5.1)
Sodium: 136 mmol/L (ref 135–145)
Sodium: 135 mmol/L (ref 135–145)
TCO2: 32 mmol/L (ref 22–32)
TCO2: 29 mmol/L (ref 22–32)
pCO2 arterial: 75 mmHg (ref 32.0–48.0)
pCO2 arterial: 42 mmHg (ref 32.0–48.0)
pH, Arterial: 7.203 — ABNORMAL LOW (ref 7.350–7.450)
pH, Arterial: 7.424 (ref 7.350–7.450)
pO2, Arterial: 129 mmHg — ABNORMAL HIGH (ref 83.0–108.0)
pO2, Arterial: 133 mmHg — ABNORMAL HIGH (ref 83.0–108.0)

## 2019-08-03 LAB — CBC WITH DIFFERENTIAL/PLATELET
Abs Immature Granulocytes: 0.05 10*3/uL (ref 0.00–0.07)
Basophils Absolute: 0.1 10*3/uL (ref 0.0–0.1)
Basophils Relative: 0 %
Eosinophils Absolute: 0.2 10*3/uL (ref 0.0–0.5)
Eosinophils Relative: 2 %
HCT: 31.2 % — ABNORMAL LOW (ref 39.0–52.0)
Hemoglobin: 9.2 g/dL — ABNORMAL LOW (ref 13.0–17.0)
Immature Granulocytes: 0 %
Lymphocytes Relative: 7 %
Lymphs Abs: 0.8 10*3/uL (ref 0.7–4.0)
MCH: 28.6 pg (ref 26.0–34.0)
MCHC: 29.5 g/dL — ABNORMAL LOW (ref 30.0–36.0)
MCV: 96.9 fL (ref 80.0–100.0)
Monocytes Absolute: 1 10*3/uL (ref 0.1–1.0)
Monocytes Relative: 8 %
Neutro Abs: 9.8 10*3/uL — ABNORMAL HIGH (ref 1.7–7.7)
Neutrophils Relative %: 83 %
Platelets: 308 10*3/uL (ref 150–400)
RBC: 3.22 MIL/uL — ABNORMAL LOW (ref 4.22–5.81)
RDW: 14.1 % (ref 11.5–15.5)
WBC: 11.9 10*3/uL — ABNORMAL HIGH (ref 4.0–10.5)
nRBC: 0 % (ref 0.0–0.2)

## 2019-08-03 LAB — HEPATITIS B SURFACE ANTIBODY,QUALITATIVE: Hep B S Ab: REACTIVE

## 2019-08-03 LAB — CULTURE, RESPIRATORY W GRAM STAIN: Culture: NORMAL

## 2019-08-03 LAB — PHOSPHORUS
Phosphorus: 8.6 mg/dL — ABNORMAL HIGH (ref 2.5–4.6)
Phosphorus: 6 mg/dL — ABNORMAL HIGH (ref 2.5–4.6)

## 2019-08-03 LAB — HEPATITIS B SURFACE ANTIGEN: Hepatitis B Surface Ag: NEGATIVE

## 2019-08-03 LAB — MAGNESIUM
Magnesium: 2.6 mg/dL — ABNORMAL HIGH (ref 1.7–2.4)
Magnesium: 2.4 mg/dL (ref 1.7–2.4)

## 2019-08-03 MED ORDER — SUCCINYLCHOLINE CHLORIDE 20 MG/ML IJ SOLN
100.0000 mg | Freq: Once | INTRAMUSCULAR | Status: AC
  Administered 2019-08-03: 100 mg via INTRAVENOUS
  Filled 2019-08-03: qty 5

## 2019-08-03 MED ORDER — PROPOFOL 1000 MG/100ML IV EMUL
25.0000 ug/kg/min | INTRAVENOUS | Status: DC
  Administered 2019-08-03 – 2019-08-04 (×5): 50 ug/kg/min via INTRAVENOUS
  Administered 2019-08-04: 40 ug/kg/min via INTRAVENOUS
  Filled 2019-08-03 (×8): qty 100

## 2019-08-03 MED ORDER — FENTANYL 2500MCG IN NS 250ML (10MCG/ML) PREMIX INFUSION
0.0000 ug/h | INTRAVENOUS | Status: DC
  Administered 2019-08-04 – 2019-08-05 (×2): 175 ug/h via INTRAVENOUS
  Administered 2019-08-05 – 2019-08-06 (×2): 200 ug/h via INTRAVENOUS
  Filled 2019-08-03 (×5): qty 250

## 2019-08-03 MED ORDER — VITAL HIGH PROTEIN PO LIQD: 1000.0000 mL | ORAL | Status: DC

## 2019-08-03 MED ORDER — SODIUM CHLORIDE 0.9 % IV SOLN
125.0000 mg | Freq: Every day | INTRAVENOUS | Status: DC
  Administered 2019-08-03 – 2019-08-04 (×2): 125 mg via INTRAVENOUS
  Filled 2019-08-03 (×4): qty 10

## 2019-08-03 MED ORDER — FENTANYL BOLUS VIA INFUSION
50.0000 ug | INTRAVENOUS | Status: DC | PRN
  Administered 2019-08-04 – 2019-08-06 (×9): 50 ug via INTRAVENOUS
  Filled 2019-08-03: qty 50

## 2019-08-03 MED ORDER — INSULIN ASPART 100 UNIT/ML ~~LOC~~ SOLN
0.0000 [IU] | SUBCUTANEOUS | Status: DC
  Administered 2019-08-03 – 2019-08-04 (×6): 2 [IU] via SUBCUTANEOUS
  Administered 2019-08-05 (×2): 3 [IU] via SUBCUTANEOUS
  Administered 2019-08-05: 2 [IU] via SUBCUTANEOUS
  Administered 2019-08-05: 5 [IU] via SUBCUTANEOUS
  Administered 2019-08-05 – 2019-08-06 (×2): 3 [IU] via SUBCUTANEOUS
  Administered 2019-08-06: 08:00:00 2 [IU] via SUBCUTANEOUS
  Administered 2019-08-06: 5 [IU] via SUBCUTANEOUS
  Administered 2019-08-06: 16:00:00 3 [IU] via SUBCUTANEOUS
  Administered 2019-08-06: 20:00:00 5 [IU] via SUBCUTANEOUS
  Administered 2019-08-07: 12:00:00 8 [IU] via SUBCUTANEOUS
  Administered 2019-08-07: 08:00:00 3 [IU] via SUBCUTANEOUS
  Administered 2019-08-07: 5 [IU] via SUBCUTANEOUS
  Administered 2019-08-07: 3 [IU] via SUBCUTANEOUS

## 2019-08-03 MED ORDER — VITAL HIGH PROTEIN PO LIQD
1000.0000 mL | ORAL | Status: DC
  Administered 2019-08-03: 1000 mL

## 2019-08-03 MED ORDER — CHLORHEXIDINE GLUCONATE CLOTH 2 % EX PADS
6.0000 | MEDICATED_PAD | Freq: Every day | CUTANEOUS | Status: DC
  Administered 2019-08-04: 6 via TOPICAL

## 2019-08-03 MED ORDER — SODIUM CHLORIDE 0.9 % IV SOLN
0.0000 ug/kg/min | INTRAVENOUS | Status: DC
  Administered 2019-08-04: 3 ug/kg/min via INTRAVENOUS
  Filled 2019-08-03 (×2): qty 20

## 2019-08-03 MED ORDER — CHLORHEXIDINE GLUCONATE CLOTH 2 % EX PADS
6.0000 | MEDICATED_PAD | Freq: Every day | CUTANEOUS | Status: DC
  Administered 2019-08-03: 6 via TOPICAL

## 2019-08-03 MED ORDER — ARTIFICIAL TEARS OPHTHALMIC OINT
1.0000 "application " | TOPICAL_OINTMENT | Freq: Three times a day (TID) | OPHTHALMIC | Status: DC
  Administered 2019-08-03 – 2019-08-06 (×5): 1 via OPHTHALMIC
  Filled 2019-08-03 (×2): qty 3.5

## 2019-08-03 MED ORDER — PRO-STAT SUGAR FREE PO LIQD
60.0000 mL | Freq: Three times a day (TID) | ORAL | Status: DC
  Administered 2019-08-03 – 2019-08-10 (×22): 60 mL
  Filled 2019-08-03 (×24): qty 60

## 2019-08-03 MED ORDER — CISATRACURIUM BOLUS VIA INFUSION
0.1000 mg/kg | Freq: Once | INTRAVENOUS | Status: AC
  Administered 2019-08-03: 9.3 mg via INTRAVENOUS
  Filled 2019-08-03: qty 10

## 2019-08-03 MED FILL — Medication: Qty: 1 | Status: AC

## 2019-08-03 NOTE — Progress Notes (Signed)
[facility] NEPHROLOGY PROGRESS NOTE  Assessment/ Plan: Pt is a 48 y.o. yo male with hypertension, DM, recent ESRD, underwent placement of PD catheter on 8/27, was receiving dialysis TTS at [facility], presented to [facility] ER with SOB and had a cardiac arrest.    # ESRD, recent start: TTS.  He has right IJ TDC.  Last HD on 8/30.  Patient has urine output of 1154 cc in 24 hours.  Plan for next hemodialysis tomorrow.  He will need clip. -Need to flush PD catheter.  # Anemia due to chronic disease: Hemoglobin 8.2.  Received blood transfusion.  Iron saturation only 3%.  I will order IV iron.  Monitor lab.  # Secondary hyperparathyroidism: PTH 93 on 07/15/2019.  Phosphorus level elevated.  Change tube feeding to Nepro.  # HTN/volume blood pressure acceptable.  Continue to monitor.  #Cardiac arrest with elevated troponin  #Acute respiratory failure with hypoxia on ventilator: Per pulmonary team.  #Acute encephalopathy: Required sedation.   Subjective: Seen and examined in ICU.  Sedated and intubated.  A synchronous with the vent last night required adjustment in the sedation.  Making decent amount of urine output. Objective Vital signs in last 24 hours: Vitals:   08/03/19 0500 08/03/19 0600 08/03/19 0700 08/03/19 0723  BP: (!) 118/92 125/81    Pulse: 99 91 86   Resp: 10     Temp: 98.6 F (37 C)  98.1 F (36.7 C)   TempSrc: Bladder  Bladder   SpO2: 100% 100% 100% 100%  Weight:      Height:    5' 6" (1.676 m)   Weight change:   Intake/Output Summary (Last 24 hours) at 08/03/2019 0800 Last data filed at 08/03/2019 0700 Gross per 24 hour  Intake 2419.03 ml  Output 4135 ml  Net -1715.97 ml       Labs: Basic Metabolic Panel: Recent Labs  Lab 07/20/2019 1127  08/02/19 0436  08/02/19 1545  08/02/19 2347 08/03/19 0319 08/03/19 0437 08/03/19 0651  NA  --    < > 137   < > 136   < > 135 136 136 135  K  --    < > 4.7   < > 4.0   < > 3.7 4.3 4.5 4.1  CL  --    < >  96*  --  96*  --  95*  --  96*  --   CO2  --    < > 27  --  26  --  24  --  24  --   GLUCOSE  --    < > 120*  --  153*  --  129*  --  164*  --   BUN  --    < > 52*  --  35*  --  42*  --  43*  --   CREATININE  --    < > 6.58*  --  4.81*  --  5.11*  --  5.54*  --   CALCIUM  --    < > 8.6*  --  8.3*  --  8.0*  --  8.0*  --   PHOS 5.4*  --  5.6*  --   --   --   --   --  8.6*  --    < > = values in this interval not displayed.   Liver Function Tests: Recent Labs  Lab 07/19/2019 0400 08/02/19 0436  AST 32 33  ALT 19 22  ALKPHOS 75 66  BILITOT 0.3 0.6  PROT 6.3* 5.7*  ALBUMIN 3.4* 2.9*   No results for input(s): LIPASE, AMYLASE in the last 168 hours. No results for input(s): AMMONIA in the last 168 hours. CBC: Recent Labs  Lab 07/31/2019 0400 07/08/2019 1127  07/26/2019 2145  08/02/19 0436  08/03/19 0319 08/03/19 0437 08/03/19 0651  WBC 20.2* 14.2*  --  9.7  --  10.8*  --   --  11.9*  --   NEUTROABS 16.0*  --   --  7.6  --   --   --   --  9.8*  --   HGB 7.8* 7.6*   < > 6.7*   < > 8.3*   < > 9.9* 9.2* 8.2*  HCT 28.1* 25.1*   < > 20.8*   < > 26.1*   < > 29.0* 31.2* 24.0*  MCV 101.4* 93.7  --  90.4  --  91.3  --   --  96.9  --   PLT 364 287  --  227  --  250  --   --  308  --    < > = values in this interval not displayed.   Cardiac Enzymes: No results for input(s): CKTOTAL, CKMB, CKMBINDEX, TROPONINI in the last 168 hours. CBG: Recent Labs  Lab 08/02/19 1558 08/02/19 1938 08/02/19 2350 08/03/19 0435 08/03/19 0726  GLUCAP 139* 143* 117* 159* 128*    Iron Studies:  Recent Labs    07/19/2019 1336  IRON 8*  TIBC 307   Studies/Results: Ct Abdomen Pelvis Wo Contrast  Result Date: 07/24/2019 CLINICAL DATA:  48 year old male with abdominal pain and distension. Patient with recent cardiac arrest. Peritoneal dialysis catheter placement . EXAM: CT ABDOMEN AND PELVIS WITHOUT CONTRAST TECHNIQUE: Multidetector CT imaging of the abdomen and pelvis was performed following the standard  protocol without IV contrast. COMPARISON:  None. FINDINGS: Please note that parenchymal abnormalities may be missed without intravenous contrast. Lower chest: Cardiomegaly is identified. Moderate bilateral LOWER lung atelectasis/consolidation noted with small bilateral pleural effusions. Ground-glass opacities within the aerated LOWER lungs are nonspecific. Hepatobiliary: The liver and gallbladder are unremarkable. No biliary dilatation. Pancreas: Unremarkable Spleen: Unremarkable Adrenals/Urinary Tract: Punctate nonobstructing bilateral renal calculi are identified as well as a LEFT renal cyst. There is no evidence of hydronephrosis or obstructing urinary calculi. A Foley catheter is present within the bladder. Fullness of bilateral adrenal glands noted without discrete nodule/mass. Stomach/Bowel: An NG tube is present with tip in the mid stomach. There is no evidence of bowel obstruction, bowel wall thickening or bowel inflammatory changes. The appendix is normal. Vascular/Lymphatic: Aortic atherosclerosis. No enlarged abdominal or pelvic lymph nodes. Reproductive: Prostate is unremarkable. Other: A peritoneal dialysis catheter is identified with tip in the RIGHT pelvis. No ascites, pneumoperitoneum or focal collection. Mild diffuse subcutaneous edema is noted. Musculoskeletal: No acute or suspicious bony abnormalities. IMPRESSION: 1. Peritoneal dialysis catheter with tip in the RIGHT pelvis. No complicating features. 2. Moderate bilateral LOWER lung atelectasis/consolidation and small bilateral pleural effusions. Mild subcutaneous edema. 3. Nonspecific diffuse bilateral ground-glass opacities within the visualized aerated LOWER lungs, question edema versus infection. 4. Punctate nonobstructing bilateral renal calculi. 5. Cardiomegaly 6.  Aortic Atherosclerosis (ICD10-I70.0). Electronically Signed   By: Margarette Canada M.D.   On: 07/10/2019 13:56   Ct Head Wo Contrast  Result Date: 07/30/2019 CLINICAL DATA:   Cardiac arrest. EXAM: CT HEAD WITHOUT CONTRAST TECHNIQUE: Contiguous axial images were obtained from the base of the skull through the  vertex without intravenous contrast. COMPARISON:  None. FINDINGS: Brain: No evidence of generalized brain atrophy or generalized brain swelling. No sign of old or acute focal infarction, mass lesion, hemorrhage, hydrocephalus or extra-axial collection. Vascular: No abnormal vascular finding. Skull: Normal Sinuses/Orbits: Mild sinus mucosal thickening. Tiny right maxillary sinus air-fluid level. Orbits negative. Other: None IMPRESSION: No focal brain insult. No evidence of diffuse hypoxic ischemic injury. Normal CT at this time. Electronically Signed   By: [name] Chimes M.D.   On: 07/07/2019 13:50   Dg Chest Port 1 View  Result Date: 08/03/2019 CLINICAL DATA:  Endotracheal tube present.  Cardiac arrest. EXAM: PORTABLE CHEST 1 VIEW COMPARISON:  One-view chest x-ray 08/02/2019 FINDINGS: Heart size is exaggerated by low lung volumes. The endotracheal tube terminates 13 mm above the carina and could be pulled back 1-2 cm for more optimal positioning. A left subclavian line is stable. Right IJ dialysis catheter is stable. Overall aeration is slightly improved. Lung volumes remain low. Right greater than left basilar airspace opacities remain. IMPRESSION: 1. Improving aeration of both lungs with persistent interstitial and airspace opacities, right greater than left. 2. Endotracheal tube is somewhat low, terminating only 1.3 cm above the carina. It could be pulled back 1-2 cm for more optimal positioning. 3. Cardiomegaly and stable mild pulmonary vascular congestion. 4. Aortic atherosclerosis. 5. Support apparatus is otherwise stable. Electronically Signed   By: San Morelle M.D.   On: 08/03/2019 06:04   Dg Chest Port 1 View  Result Date: 08/02/2019 CLINICAL DATA:  Ventilator support EXAM: PORTABLE CHEST 1 VIEW COMPARISON:  07/13/2019 FINDINGS: Endotracheal tube is 1 cm above  the carina. Nasogastric tube enters the stomach. Left subclavian central line tip in the SVC at the azygos level. Right internal jugular central line tip in the right atrium. Diffuse pulmonary airspace filling persist consistent with edema and/or pneumonia. IMPRESSION: Lines and tubes unchanged in well-positioned. Diffuse pulmonary opacity consistent with edema and/or pneumonia. Electronically Signed   By: [name] Chimes M.D.   On: 08/02/2019 07:38   Dg Chest Port 1 View  Result Date: 07/09/2019 CLINICAL DATA:  Central line placement EXAM: PORTABLE CHEST 1 VIEW COMPARISON:  07/11/2019, 4:27 a.m. FINDINGS: Interval placement of a left subclavian vascular catheter, tip projecting over the superior SVC. Other support apparatus including endotracheal tube, esophagogastric tube, and right neck large bore multi lumen vascular catheter is unchanged. Otherwise unchanged examination featuring heterogeneous and interstitial bilateral pulmonary opacity, most conspicuous in the right midlung, consistent with edema or infection, and cardiomegaly. IMPRESSION: Interval placement of a left subclavian vascular catheter, tip projecting over the superior SVC. Electronically Signed   By: Eddie Candle M.D.   On: 07/22/2019 12:49    Medications: Infusions: . sodium chloride Stopped (07/18/2019 1242)  . sodium chloride 10 mL/hr at 08/03/19 0700  . sodium chloride    . sodium chloride    . ampicillin-sulbactam (UNASYN) IV Stopped (08/02/19 2212)  . cisatracurium (NIMBEX) infusion 3 mcg/kg/min (08/03/19 0700)  . fentaNYL infusion INTRAVENOUS 100 mcg/hr (08/03/19 0700)  . phenylephrine (NEO-SYNEPHRINE) Adult infusion    . propofol (DIPRIVAN) infusion 50 mcg/kg/min (08/03/19 0700)    Scheduled Medications: . acetaminophen (TYLENOL) oral liquid 160 mg/5 mL  650 mg Per Tube Q6H  . artificial tears  1 application Both Eyes T8U  . chlorhexidine gluconate (MEDLINE KIT)  15 mL Mouth Rinse BID  . Chlorhexidine Gluconate Cloth  6  each Topical Q0600  . Chlorhexidine Gluconate Cloth  6 each Topical Daily  .  feeding supplement (PRO-STAT SUGAR FREE 64)  30 mL Per Tube BID  . feeding supplement (VITAL HIGH PROTEIN)  1,000 mL Per Tube Q24H  . heparin  5,000 Units Subcutaneous Q8H  . insulin aspart  1-3 Units Subcutaneous Q4H  . mouth rinse  15 mL Mouth Rinse 10 times per day  . pantoprazole (PROTONIX) IV  40 mg Intravenous QHS  . sodium chloride flush  10-40 mL Intracatheter Q12H  . sodium chloride flush  3 mL Intravenous Q12H    have reviewed scheduled and prn medications.  Physical Exam: General: Sedated, intubated Heart:RRR, s1s2 nl Lungs: Coarse breath sound bilateral, no wheezing Abdomen:soft, mild distention, PD catheter site looks clean Extremities:No edema Dialysis Access: Right IJ tunnel catheter, has a PD catheter site clean.  Dron Prasad Bhandari 08/03/2019,8:00 AM  LOS: 2 days  Pager: [phone]

## 2019-08-03 NOTE — Progress Notes (Signed)
Nutrition Follow-up  RD working remotely.  DOCUMENTATION CODES:   Obesity unspecified  INTERVENTION:   Tube feeding: - Vital High Protein @ 20 ml/hr via OG tube (480 ml/day) - Pro-stat 60 ml TID  Tube feeding regimen provides 1080 kcal, 132 grams of protein, and 401 ml of H2O.   Tube feeding regimen and current propofol provides 1814 total kcal (>100% of needs).  NUTRITION DIAGNOSIS:   Increased nutrient needs related to chronic illness (ESRD on dialysis) as evidenced by estimated needs.  Ongoing, being addressed via TF  GOAL:   Provide needs based on ASPEN/SCCM guidelines  Met via TF  MONITOR:   Vent status, TF tolerance, Labs, Weight trends, I & O's  REASON FOR ASSESSMENT:   Consult Enteral/tube feeding initiation and management  ASSESSMENT:   48 year old male with medical history significant for hypertension, PAD, hyperlipidemia, CKD-stage 5 presented to [facility] emergency room with respiratory distress/hypoxia. On arrival to ER suffered a cardiac arrest with asystole with ROSC in 15 minutes.  8/27 - s/p placement of PD catheter  Last HD was on 8/30 with plans for next treatment tomorrow. Nimbex off this morning.  Per RN edema assessment, pt with mid pitting generalized edema, mild pitting edema to BUE, and deep pitting edema to BLE.  OG tube in place.  Current TF: Vital High Protein @ 50 ml/hr, Pro-stat 30 ml BID  Patient is currently intubated on ventilator support MV: 11.3 L/min Temp (24hrs), Avg:98.2 F (36.8 C), Min:96.6 F (35.9 C), Max:99.9 F (37.7 C) BP (cuff): 117/73 MAP (cuff): 87  Drips: Propofol: 27.8 ml/hr (provides 734 kcal daily from lipid) Fentanyl: 10 ml/hr  Medications reviewed and include: SSI q 4 hours, Protonix, IV abx, ferric gluconate  Labs reviewed: calcium ionized 1.00, phosphorus 8.6, magnesium 2.6, hemoglobin 8.2 CBG's: 117-159 x 24 hours  UOP: 1154 ml x 24 hours OGT output: 455 ml x 24 hours HD net UF 8/30:  2526 ml I/O's: -1.6 L since admit  NUTRITION - FOCUSED PHYSICAL EXAM:  Unable to complete at this time. RD working remotely.  Diet Order:   Diet Order            Diet NPO time specified  Diet effective now              EDUCATION NEEDS:   Not appropriate for education at this time  Skin:  Skin Assessment: Reviewed RN Assessment  Last BM:  07/18/2019  Height:   Ht Readings from Last 1 Encounters:  08/03/19 '5\' 6"'$  (1.676 m)    Weight:   Wt Readings from Last 1 Encounters:  07/09/2019 92.6 kg    Ideal Body Weight:  64.5 kg  BMI:  Body mass index is 32.95 kg/m.  Estimated Nutritional Needs:   Kcal:  4081-4481  Protein:  129-140 grams  Fluid:  UOP + 1000 ml    Gaynell Face, MS, RD, LDN Inpatient Clinical Dietitian Pager: [phone] Weekend/After Hours: [phone]

## 2019-08-03 NOTE — Progress Notes (Signed)
Glucose elevated to 170's order obtained for ss coverage

## 2019-08-03 NOTE — Progress Notes (Signed)
RT called to bedside by RN to acces new onset breathing pattern. After examination the patient appeared to be getting one sigh breath approximately every 45 seconds, and not receiving machine breaths accurately . After carefull examination and CCM consult, it was assumed that this was a possible neurological issue, multiple vent settings and changes where attempted with no successful result. PT will remain on previous settings, ABG will be drawn and MD is aware of situation.

## 2019-08-03 NOTE — Progress Notes (Signed)
[facility] COVER[employer] CRITICAL CARE PROGRESS NOTE  CTSP re: abnormal patterns of respiration and acute hypercapnia on ABG.  Patient has been started on fentanyl infusion and continues on propofol infusion in the interim.  At the time of clinical encounter, fentanyl @ 250 mcg/hr and propofol @ 15 mcg/kg/min.  Review of ventilator tracings and cycling through various modes of ventilation reveals that the patient is demonstrating prolonged expiratory phase associated with abdominal rigidity (much like an FVC maneuver).  Probably neurogenically mediated abnormal respiratory pattern.  However, this may also reflect muscle rigidity from high dose fentanyl infusion.  Will administer succinylcholine 100 mg IV single dose.  Monitor vent tracings at bedside.  ABG in an hour.  Renee Pain, MD Board Certified by the ABIM, [place]

## 2019-08-03 NOTE — Progress Notes (Addendum)
PULMONARY / CRITICAL CARE MEDICINE   NAME:  Larry Banks, MRN:  [id number], DOB:  01-07-1971, LOS: 2 ADMISSION DATE:  07/28/2019, CONSULTATION DATE:  07/29/2019  REFERRING MD:  EDP Dr. Tomi Bamberger  , CHIEF COMPLAINT:  Cardiac Arrest/Resp Failure   BRIEF HISTORY:     48 year old male with medical history significant for hypertension, PAD ,hyperlipidemia, CKD Stage 5 developed shortness of breath at home.  EMS was contacted patient was notable to have hypoxia with O2 saturations in the 80s.  Patient was placed on nonrebreather and transported to the emergency room.  Patient does have CKD Stage 5 .  Is followed at [facility].  Recently had peritoneal dialysis catheter placed on August 27. Has not started Dialysis yet.  On arrival to the emergency room patient suffered a cardiac arrest with asystole.  He received CPR x15 minutes.  He was given the hyperkalemic protocol due to his underlying chronic kidney disease with 1 amp bicarb, 1 amp D50, insulin 10 units regular IV.  He received epi x3.  He had ROSC after 15 minutes.  He did require very brief Neo-Synephrine for hypotension.  He had significant agitation was started on sedation with Versed and then transition to Diprivan .  Patient was transported to [facility] ICU for [name] to admit.  Patient is sedated on vent. Withdraws to pain does not follow commands.  Initial ABG showed severe acidosis with pH of 7.15.  Post intubation ABG showed improvement with pH at 7.3.,  PCO2 45, PO2 152, O2 saturation 99.  Serum creatinine 5.8.  Glucose 361.  Potassium 4.3.  LFTs okay.  Troponin I was elevated.  WBC 20.2 K, hemoglobin 7.8, platelet 364. SARS CoV2 NEG.  Chest x-ray showed interstitial and airspace opacities favoring pulmonary edema greater on the right.  SIGNIFICANT PAST MEDICAL HISTORY   Hypertension, hyperlipidemia, PAD, chronic kidney disease stage V  SIGNIFICANT EVENTS:  8/26-  TTS HD - last 07/29/2019 [name] (old tunneled Rt HD cah) 8/27 - PD  cath - for a planned change from iHD to PD 8/29- Cardiac arrest, CPR x15 minutes, epi x3. Per renal CXR- c/w pulmonary edema 8/31- 1 unit PRBC, paralyzed due to vent dysynchrony  STUDIES:   CT head 8/29 > no acute abnormality CT abdomen pelvis 8/29> bilateral lower lobe consolidation with small pleural effusion.  Groundglass opacities lower portion of the lung.  Nonobstructive renal calculus, 8/29 Echo > ef 45% 0 8/29 - EEG  CULTURES:  8/29 BC >> 8/29 Sputum >> Rare GNR, GPC 8/29 COVID 19 >> NEG   ANTIBIOTICS:  Unasyn 8/29 >  LINES/TUBES:  Peritoneal cath 8/27 (PTA )  R HD cath ? >>  ------- 8/29 ETT >>  8/29 =- CVL 8/29 a line  CONSULTANTS:  Renal   SUBJECTIVE:   Dyssynchronous with the vent.  Given 1 dose of paralytic with temporary improvement.  Started on Nimbex drip Repeat ABG shows improvement in hypercarbia.   CONSTITUTIONAL: BP 133/80   Pulse 87   Temp 98.1 F (36.7 C) (Bladder)   Resp 10   Ht 5\' 6"  (1.676 m)   Wt 92.6 kg   SpO2 100%   BMI 32.95 kg/m   I/O last 3 completed shifts: In: 4456.6 [I.V.:1830.7; Blood:670; Other:30; NG/GT:1025.8; IV Piggyback:900.1] Out: [id number] [Urine:2404; Emesis/NG output:1055; Other:2526]  CVP:  [6 mmHg-16 mmHg] 14 mmHg  Vent Mode: PRVC FiO2 (%):  [40 %-50 %] 40 % Set Rate:  [16 bmp-22 bmp] 22 bmp Vt Set:  [510  mL] 510 mL PEEP:  [8 cmH20] 8 cmH20 Plateau Pressure:  [15 cmH20-31 cmH20] 22 cmH20  Gen:      No acute distress, obese HEENT:  EOMI, sclera anicteric Neck:     No masses; no thyromegaly ET tube Lungs:    Clear to auscultation bilaterally; normal respiratory effort CV:         Regular rate and rhythm; no murmurs Abd:      + bowel sounds; soft, non-tender; no palpable masses, no distension Ext:    No edema; adequate peripheral perfusion Skin:      Warm and dry; no rash Neuro: Sedated, unresponsive  RESOLVED PROBLEM LIST   ASSESSMENT AND PLAN   Acute Hypoxic Respiratory Failure in setting of cardiac  arrest  Pulmonary edema, aspiration pneumonia P; Continue full vent support Follow ABG Continue Unasyn Follow cultures On paralytics due to vent dyssynchrony.  Will wean off and re-assess today.  Cardiac Arrest 8/29 w/ ROSC (CPR x 12min )  HTN /PAD  Elevated Troponin (after CPR )  Hypotension -b/p soft  P: Cardiac monitoring. Holding home hypertension medication  CKD Stage 5/ESRD   (Followed at [facility] )  - on TTS scheduld -Peritoneal Cath placed on 8/27 to replace old R IJ P; Per renal  Acute encephalopathy - CT head at admit  No evidence of ischemia Rewarmed S/p 36 degree hypothermia protocol P: Sedation, paralytics as above Monitor mental status.  DM -Hyperglycemia  P: ICU SSI protocol   Anemia  No sign of active bleeding. S/p PRBC X 1 P: Follow hemoglobin  SUMMARY OF TODAY'S PLAN:    Best Practice / Goals of Care / Disposition.   Diet: Tube feeds Pain/Anxiety/Delirium protocol (if indicated): propofol VAP protocol (if indicated): ordered DVT prophylaxis: hep SQ GI prophylaxis: PPI Glucose control: SSI Mobility: Bed Code Status: Full Family Communication: Wife [name] called and updated on 8/31.  Disposition: ICU  ATTESTATION & SIGNATURE   The patient is critically ill with multiple organ system failure and requires high complexity decision making for assessment and support, frequent evaluation and titration of therapies, advanced monitoring, review of radiographic studies and interpretation of complex data.   Critical Care Time devoted to patient care services, exclusive of separately billable procedures, described in this note is 35 minutes.   Marshell Garfinkel MD [facility] Pager [phone] If no answer call [phone] 08/03/2019, 8:05 AM

## 2019-08-03 NOTE — Progress Notes (Signed)
Continues to take inadequate breaths, double stack. MD aware. Orders to restart paralytic at this time. Biz and TOF placed.Prop increased. Wife updated

## 2019-08-03 NOTE — Progress Notes (Signed)
[name] DOB: 06-20-1971 MRN: [id number]   Date of Service  08/03/2019  HPI/Events of Note  Patient asynchronous with ventilator again after succinylcholine has worn off. ESRD will avoid Succinylcholine. Will use shorter acting NMB to permit evaluation of neurological status.   eICU Interventions  Will order: 1. NMB per protocol. 2. ABG after 1 hour on NMB.     Intervention Category Major Interventions: Respiratory failure - evaluation and management  Jiovany Scheffel [place] 08/03/2019, 4:27 AM

## 2019-08-04 ENCOUNTER — Inpatient Hospital Stay (HOSPITAL_COMMUNITY): Payer: 59

## 2019-08-04 LAB — CBC WITH DIFFERENTIAL/PLATELET
Abs Immature Granulocytes: 0.04 10*3/uL (ref 0.00–0.07)
Basophils Absolute: 0 10*3/uL (ref 0.0–0.1)
Basophils Relative: 0 %
Eosinophils Absolute: 0.3 10*3/uL (ref 0.0–0.5)
Eosinophils Relative: 3 %
HCT: 24.1 % — ABNORMAL LOW (ref 39.0–52.0)
Hemoglobin: 7.4 g/dL — ABNORMAL LOW (ref 13.0–17.0)
Immature Granulocytes: 1 %
Lymphocytes Relative: 11 %
Lymphs Abs: 0.9 10*3/uL (ref 0.7–4.0)
MCH: 28.5 pg (ref 26.0–34.0)
MCHC: 30.7 g/dL (ref 30.0–36.0)
MCV: 92.7 fL (ref 80.0–100.0)
Monocytes Absolute: 0.7 10*3/uL (ref 0.1–1.0)
Monocytes Relative: 8 %
Neutro Abs: 6.3 10*3/uL (ref 1.7–7.7)
Neutrophils Relative %: 77 %
Platelets: 284 10*3/uL (ref 150–400)
RBC: 2.6 MIL/uL — ABNORMAL LOW (ref 4.22–5.81)
RDW: 14.2 % (ref 11.5–15.5)
WBC: 8.1 10*3/uL (ref 4.0–10.5)
nRBC: 0 % (ref 0.0–0.2)

## 2019-08-04 LAB — TRIGLYCERIDES: Triglycerides: 339 mg/dL — ABNORMAL HIGH (ref <150)

## 2019-08-04 LAB — MAGNESIUM
Magnesium: 2.4 mg/dL (ref 1.7–2.4)
Magnesium: 2.2 mg/dL (ref 1.7–2.4)

## 2019-08-04 LAB — GLUCOSE, CAPILLARY
Glucose-Capillary: 117 mg/dL — ABNORMAL HIGH (ref 70–99)
Glucose-Capillary: 118 mg/dL — ABNORMAL HIGH (ref 70–99)
Glucose-Capillary: 122 mg/dL — ABNORMAL HIGH (ref 70–99)
Glucose-Capillary: 122 mg/dL — ABNORMAL HIGH (ref 70–99)
Glucose-Capillary: 123 mg/dL — ABNORMAL HIGH (ref 70–99)
Glucose-Capillary: 124 mg/dL — ABNORMAL HIGH (ref 70–99)
Glucose-Capillary: 136 mg/dL — ABNORMAL HIGH (ref 70–99)
Glucose-Capillary: 148 mg/dL — ABNORMAL HIGH (ref 70–99)

## 2019-08-04 LAB — BASIC METABOLIC PANEL
Anion gap: 17 — ABNORMAL HIGH (ref 5–15)
BUN: 61 mg/dL — ABNORMAL HIGH (ref 6–20)
CO2: 21 mmol/L — ABNORMAL LOW (ref 22–32)
Calcium: 7 mg/dL — ABNORMAL LOW (ref 8.9–10.3)
Chloride: 99 mmol/L (ref 98–111)
Creatinine, Ser: 6.58 mg/dL — ABNORMAL HIGH (ref 0.61–1.24)
GFR calc Af Amer: 10 mL/min — ABNORMAL LOW (ref >60)
GFR calc non Af Amer: 9 mL/min — ABNORMAL LOW (ref >60)
Glucose, Bld: 131 mg/dL — ABNORMAL HIGH (ref 70–99)
Potassium: 3.6 mmol/L (ref 3.5–5.1)
Sodium: 137 mmol/L (ref 135–145)

## 2019-08-04 LAB — PHOSPHORUS
Phosphorus: 6.1 mg/dL — ABNORMAL HIGH (ref 2.5–4.6)
Phosphorus: 4.9 mg/dL — ABNORMAL HIGH (ref 2.5–4.6)

## 2019-08-04 LAB — HEMOGLOBIN A1C
Hgb A1c MFr Bld: 6.7 % — ABNORMAL HIGH (ref 4.8–5.6)
Mean Plasma Glucose: 145.59 mg/dL

## 2019-08-04 MED ORDER — HEPARIN SODIUM (PORCINE) 1000 UNIT/ML DIALYSIS
20.0000 [IU]/kg | INTRAMUSCULAR | Status: DC | PRN
  Administered 2019-08-04 – 2019-08-08 (×2): 1900 [IU] via INTRAVENOUS_CENTRAL
  Filled 2019-08-04 (×2): qty 2

## 2019-08-04 MED ORDER — ALTEPLASE 2 MG IJ SOLR: 2.0000 mg | Freq: Once | INTRAMUSCULAR | Status: DC | PRN

## 2019-08-04 MED ORDER — SODIUM CHLORIDE 0.9 % IV SOLN: 100.0000 mL | INTRAVENOUS | Status: DC | PRN

## 2019-08-04 MED ORDER — MIDAZOLAM BOLUS VIA INFUSION
1.0000 mg | INTRAVENOUS | Status: DC | PRN
  Administered 2019-08-04 – 2019-08-06 (×8): 2 mg via INTRAVENOUS
  Filled 2019-08-04: qty 2

## 2019-08-04 MED ORDER — LIDOCAINE-PRILOCAINE 2.5-2.5 % EX CREA
1.0000 "application " | TOPICAL_CREAM | CUTANEOUS | Status: DC | PRN
  Filled 2019-08-04: qty 5

## 2019-08-04 MED ORDER — DARBEPOETIN ALFA 60 MCG/0.3ML IJ SOSY
60.0000 ug | PREFILLED_SYRINGE | INTRAMUSCULAR | Status: DC
  Administered 2019-08-04 – 2019-08-18 (×3): 60 ug via INTRAVENOUS
  Filled 2019-08-04 (×4): qty 0.3

## 2019-08-04 MED ORDER — NOREPINEPHRINE 4 MG/250ML-% IV SOLN
0.0000 ug/min | INTRAVENOUS | Status: DC
  Administered 2019-08-05: 2 ug/min via INTRAVENOUS
  Filled 2019-08-04: qty 250

## 2019-08-04 MED ORDER — LIDOCAINE HCL (PF) 1 % IJ SOLN: 5.0000 mL | INTRAMUSCULAR | Status: DC | PRN

## 2019-08-04 MED ORDER — NEPRO/CARBSTEADY PO LIQD
1000.0000 mL | ORAL | Status: DC
  Administered 2019-08-04: 12:00:00 1000 mL via ORAL

## 2019-08-04 MED ORDER — HEPARIN SODIUM (PORCINE) 1000 UNIT/ML DIALYSIS
1000.0000 [IU] | INTRAMUSCULAR | Status: DC | PRN
  Administered 2019-08-15: 11:00:00 1000 [IU] via INTRAVENOUS_CENTRAL
  Filled 2019-08-04 (×2): qty 1

## 2019-08-04 MED ORDER — MIDAZOLAM 50MG/50ML (1MG/ML) PREMIX INFUSION
0.0000 mg/h | INTRAVENOUS | Status: DC
  Administered 2019-08-04: 13:00:00 1 mg/h via INTRAVENOUS
  Administered 2019-08-04: 6 mg/h via INTRAVENOUS
  Administered 2019-08-05: 08:00:00 3 mg/h via INTRAVENOUS
  Filled 2019-08-04 (×4): qty 50

## 2019-08-04 MED ORDER — PENTAFLUOROPROP-TETRAFLUOROETH EX AERO: 1.0000 "application " | INHALATION_SPRAY | CUTANEOUS | Status: DC | PRN

## 2019-08-04 NOTE — Progress Notes (Signed)
Nutrition Follow-up  RD working remotely.  DOCUMENTATION CODES:   Obesity unspecified  INTERVENTION:   Tube feeding: - Nepro @ 20 ml/hr (480 ml/day) via OG tube - Pro-stat 60 ml TID  Tube feeding regimen provides 1464 kcal, 129 grams of protein, and 349 ml of H2O.   Tube feeding regimen and current propofol provides 2050 total kcal (>100% of needs).  NUTRITION DIAGNOSIS:   Increased nutrient needs related to chronic illness (ESRD on dialysis) as evidenced by estimated needs.  Ongoing, being addressed via TF  GOAL:   Provide needs based on ASPEN/SCCM guidelines  Met via TF  MONITOR:   Vent status, TF tolerance, Labs, Weight trends, I & O's  REASON FOR ASSESSMENT:   Consult Enteral/tube feeding initiation and management  ASSESSMENT:   48 year old male with medical history significant for hypertension, PAD, hyperlipidemia, CKD-stage 5 presented to [facility] emergency room with respiratory distress/hypoxia. On arrival to ER suffered a cardiac arrest with asystole with ROSC in 15 minutes.  RD consulted to switch tube feeding formula to Nepro.  Noted plan for HD today. Per CCM note, if pt needs > 10 mcg of pressors, he will require CVVHD. 37 degrees normothermic protocol to end at 8 pm tonight. Pt on Nimbex due to dyssynchrony with vent. Per CCM, try weaning paralytic today.  EDW: 92.6 kg.  OG tube remains in place.  Current TF: Vital High Protein @ 20 ml/hr, Pro-stat 60 ml TID  Patient is currently intubated on ventilator support MV: 11.1 L/min Temp (24hrs), Avg:98.6 F (37 C), Min:98.4 F (36.9 C), Max:98.8 F (37.1 C) BP (cuff): 100/69 MAP (cuff): 80  Drips: Propofol: 22.2 ml/hr (provides 586 kcal daily from lipid) Fentanyl: 10 ml/hr Nimbex: 5.6 ml/hr NS: 10 ml/hr  Medications reviewed and include: SSI q 4 hours, Protonix, IV abx  Labs reviewed: phosphorus 6.1, triglycerides 339, hemoglobin 7.4 CBG's: 122-172 x 24 hours  UOP: 815 ml x 24  hours  Diet Order:   Diet Order            Diet NPO time specified  Diet effective now              EDUCATION NEEDS:   Not appropriate for education at this time  Skin:  Skin Assessment: Skin Integrity Issues: Other: pressure injury to tongue  Last BM:  08/04/19  Height:   Ht Readings from Last 1 Encounters:  08/03/19 '5\' 6"'$  (1.676 m)    Weight:   Wt Readings from Last 1 Encounters:  08/04/19 106.1 kg    Ideal Body Weight:  64.5 kg  BMI:  Body mass index is 37.75 kg/m.  Estimated Nutritional Needs:   Kcal:  7034-0352  Protein:  129-140 grams  Fluid:  UOP + 1000 ml    Gaynell Face, MS, RD, LDN Inpatient Clinical Dietitian Pager: [phone] Weekend/After Hours: [phone]

## 2019-08-04 NOTE — Progress Notes (Signed)
Brief Progress Note   Nursing called to update on attempts to wean from Nimbex. Once off paralytic patient had upward deviation of eyes bilaterally He had abnormal extension and dyssynchrony with vent. Post Arrest patient did have some myoclonus, however initial head Ct 8/29 showed No focal brain insult. No evidence of diffuse hypoxic ischemic injury. Normal CT .  Plan Will consult Neuro for further evaluation of possible etiology Spoke with Dr. Leonel Ramsay STAT Head CT without contrast ordered  Will get continuous EEG per neuro once he returns from CT scan.  Sedation resumed to allow for HD to be completed, and fro transportation to [place], AGACNP-BC [place] Pager # [phone] After 4 pm please call [phone]  08/04/2019 2:04 PM

## 2019-08-04 NOTE — Procedures (Signed)
Patient Name: DAYVEN HIMMELMAN  MRN: [id number]  Epilepsy Attending: Lora Havens  Referring Physician/Provider: Dr Roland Rack Date: 08/04/2019 Duration: 21.51 mins  Patient history: 48yo M with cardiac arrest and seizure like episode. EEG to evaluate for seizure.   Level of alertness: comatose  AEDs during EEG study: versed  Technical aspects: This EEG study was done with scalp electrodes positioned according to the 10-20 International system of electrode placement. Electrical activity was acquired at a sampling rate of 500Hz  and reviewed with a high frequency filter of 70Hz  and a low frequency filter of 1Hz . EEG data were recorded continuously and digitally stored.   DESCRIPTION: EEG showed continuous generalized 2-3 hz low voltage delta slowing as well as generalized suppression. EEG was not reactive to stimulation. Hyperventilation and photic stimulation were not performed.  IMPRESSION:  This study is suggestive of profound diffuse encephalopathy,could be secondary to anoxic/hypoxic brain injury and sedation. No seizures or epileptiform discharges were seen throughout the recording.  Lora Havens, MD

## 2019-08-04 NOTE — Progress Notes (Addendum)
PULMONARY / CRITICAL CARE MEDICINE   NAME:  Larry Banks, MRN:  [id number], DOB:  1971-07-05, LOS: 3 ADMISSION DATE:  07/10/2019, CONSULTATION DATE:  07/13/2019  REFERRING MD:  EDP Dr. Tomi Bamberger  , CHIEF COMPLAINT:  Cardiac Arrest/Resp Failure   BRIEF HISTORY:    48 year old male with medical history significant for hypertension, PAD ,hyperlipidemia, CKD Stage 5 developed shortness of breath at home.  Patient does have CKD Stage 5 .  Is followed at [facility].  Recently had peritoneal dialysis catheter placed on August 27. Has not started Dialysis yet.  On arrival to the emergency room patient suffered a cardiac arrest with asystole.  He received CPR x15 minutes.  He was given the hyperkalemic protocol due to his underlying chronic kidney disease with 1 amp bicarb, 1 amp D50, insulin 10 units regular IV.  He received epi x3.  He had ROSC after 15 minutes.  He did require very brief Neo-Synephrine for hypotension.  He had significant agitation was started on sedation with Versed and then transition to Diprivan .   Patient was transported to [facility] ICU for [name] to admit.   SIGNIFICANT PAST MEDICAL HISTORY   Hypertension, hyperlipidemia, PAD, chronic kidney disease stage V  SIGNIFICANT EVENTS:  8/26-  TTS HD - last 07/29/2019 [name] (old tunneled Rt HD cah) 8/27 - PD cath - for a planned change from iHD to PD 8/29- Cardiac arrest, CPR x15 minutes, epi x3. Per renal CXR- c/w pulmonary edema 8/31- 1 unit PRBC, paralyzed due to vent dysynchrony 9/1- For HD, if need > 10 mcg pressor, per renal will need CVVHD  STUDIES:   CT head 8/29 > no acute abnormality CT abdomen pelvis 8/29> bilateral lower lobe consolidation with small pleural effusion.  Groundglass opacities lower portion of the lung.  Nonobstructive renal calculus, 8/29 Echo > ef 45% 0 8/29 - EEG  CULTURES:  8/29 BC >> 8/29 Sputum >> Rare GNR, GPC>> Normal [place]ora 8/29 COVID 19 >> NEG   ANTIBIOTICS:  Unasyn 8/29  >  LINES/TUBES:  Peritoneal cath 8/27 (PTA )  R HD cath ? >>  ------- 8/29 ETT >>  8/29 =- CVL 8/29 a line  CONSULTANTS:  Renal   SUBJECTIVE:   Dyssynchronous with the vent.  Started on Nimbex drip 8/31 with  Repeat ABG showing improvement in hypercarbia. Remains sedated on Fentanyl and Propofol 37 degrees normothermic protocol to end 8 pm 9/1 ( tonight) + 1800 cc For HD 9/1   CONSTITUTIONAL: BP 115/82 (BP Location: Left Arm)   Pulse 93   Temp 98.6 F (37 C) (Bladder)   Resp (!) 22   Ht 5\' 6"  (1.676 m)   Wt 106.1 kg Comment: w/TTM pads on  SpO2 100%   BMI 37.75 kg/m   I/O last 3 completed shifts: In: 4014.6 [I.V.:1949.2; NG/GT:1577.5; IV Piggyback:487.8] Out: 1085 [Urine:1085]  CVP:  [11 mmHg-14 mmHg] 14 mmHg  Vent Mode: PRVC FiO2 (%):  [40 %] 40 % Set Rate:  [22 bmp] 22 bmp Vt Set:  [510 mL] 510 mL PEEP:  [8 cmH20] 8 cmH20 Plateau Pressure:  [20 [id number] cmH20] 22 cmH20  Gen:      No acute distress, obese, sedated and paralyzed HEENT:  EOMI, sclera anicteric, ETT, OG tube, thick neck Neck:     No masses; no thyromegaly  Lungs:   Bilateral chest excursion Clear to auscultation, few basilar crackles,  bilaterally; mechanically ventilated, chemical paralysis CV:  S1, S2, Regular rate and rhythm; no murmurs, Rub or gallop Abd:      diminished bowel sounds; soft, non-tender; no palpable masses, no distension, obese Ext:    Trace  edema; adequate peripheral perfusion Skin:      Warm and dry; no rash, no lesions Neuro: Sedated, paralyzed,  unresponsive  RESOLVED PROBLEM LIST   ASSESSMENT AND PLAN   Acute Hypoxic Respiratory Failure in setting of cardiac arrest  CXR 9/1>> New patchy left basilar airspace disease 2/2 atelectasis  less likely, pneumonia.>> personally reviewed by me Cardiomegaly without edema. Pulmonary edema, aspiration pneumonia Afebrile, WBC 8.1 P; Continue full vent support Follow ABG Trend CXR Continue Unasyn Follow  cultures On paralytics due to vent dyssynchrony.   Will attempt to wean off and re-assess today.   Cardiac Arrest 8/29 w/ ROSC (CPR x 67min )  HTN /PAD  Elevated Troponin (after CPR )  Hypotension -b/p soft , no pressors at present SP 36 degree hypothermia protocol Normothermia protocol ends 9/1 at 2000 P: Tele monitoring. Holding home hypertension medication EKG prn Trend troponin until normal   CKD Stage 5/ESRD   (Followed at [facility] )  - on TTS scheduled -Peritoneal Cath placed on 8/27 to replace old R IJ P; Per renal will need HD 9/1 If need > 10 mcg of pressor support>> will need to consider CRRT per renal  Acute encephalopathy - CT head at admit  No evidence of ischemia Rewarmed S/p 36 degree hypothermia protocol P: Sedation, paralytics as above Wean Nimbex, if remains dyssynchronous with vent will have Neuro assess for etiology Minimize sedation as able Monitor mental status as sedation is weaned to off. Repeat Imaging as condition dictates  DM -Hyperglycemia  P: ICU SSI protocol  CBG Q 4  Anemia  No sign of active bleeding. S/p PRBC X 1 on 8/31 Iron saturation only 3%.  . P: Follow hemoglobin Renal Ordered IV iron x 5 days starting  8/31.  Continue Aranesp Transfuse for HGB < 7  Secondary Hyperparathyroidism PTH 93 on 8/28 Plan Switch TF to Nephro per Dietician  Trend PTH  SUMMARY OF TODAY'S PLAN:  For HD, may need pressors Try weaning paralytic , if remains desynchronous with vent will need to get neuro to eval. For possible etiology Ongoing neurological re-assessment  Best Practice / Goals of Care / Disposition.   Diet: Tube feeds Pain/Anxiety/Delirium protocol (if indicated): propofol VAP protocol (if indicated): ordered DVT prophylaxis: hep SQ GI prophylaxis: PPI Glucose control: SSI Mobility: Bed Code Status: Full Family Communication:Called wife to provide update, no answer 9/1 Disposition: ICU  ATTEST[place] & [place], AGACNP-BC [place] [facility] Pager [phone] If no answer call [phone] 08/04/2019, 8:35 AM

## 2019-08-04 NOTE — Consult Note (Signed)
Neurology Consultation Reason for Consult: Concern for anoxic brain injury Referring Physician: Jimmye Norman, ED  CC: Unresponsiveness  History is obtained from: Chart review  HPI: Larry Banks is a 48 y.o. male with a history of diabetes, ESRD, hypertension who presented with shortness of breath leading to cardiac arrest.  He underwent CPR with downtime of at least 15 minutes.  Early on, there was some concern for myoclonus.  He is undergoing normothermia protocol, finishing 8 p.m. tonight.  CT on admission did not reveal any acute findings.  He developed vent dyssynchrony and was on paralytics for the vent dyssynchrony.  When he was taken off of it today, it was noted that he was "stiff" and had an upward gaze.  There was concern for seizure, and therefore neurology has been consulted.   ROS: Unable to obtain due to altered mental status.   Past Medical History:  Diagnosis Date  . Diabetes mellitus ([place])   . ESRD (end stage renal disease) on dialysis ([place])   . HLD (hyperlipidemia)   . HTN (hypertension)   . PAD (peripheral artery disease) ([place])      Family history: Unable to obtain due to altered mental status   Social History:  reports that he has never smoked. He has never used smokeless tobacco. No history on file for alcohol and drug.   Exam: Current vital signs: BP (!) 127/91   Pulse 90   Temp 98.7 F (37.1 C) (Oral)   Resp (!) 22   Ht 5\' 6"  (1.676 m)   Wt 106.1 kg Comment: w/TTM pads on  SpO2 100%   BMI 37.75 kg/m  Vital signs in last 24 hours: Temp:  [98.4 F (36.9 C)-99.1 F (37.3 C)] 98.7 F (37.1 C) (09/01 1200) Pulse Rate:  [77-131] 90 (09/01 1530) Resp:  [22-24] 22 (09/01 1200) BP: (94-166)/(67-119) 127/91 (09/01 1530) SpO2:  [97 %-100 %] 100 % (09/01 1530) FiO2 (%):  [40 %] 40 % (09/01 1200) Weight:  [106.1 kg] 106.1 kg (09/01 0200)   Physical Exam  Constitutional: Appears well-developed and well-nourished.  Psych: Unresponsive Eyes: No scleral  injection HENT: ET tube in place Head: Normocephalic.  Cardiovascular: Normal rate and regular rhythm.  Respiratory: Ventilated GI: Soft.  No distension. There is no tenderness.  Skin: WDI  Neuro: Mental Status: Patient is comatose, does not open eyes or follow commands Cranial Nerves: II: Does not blink to threat pupils are equal, round, and reactive to light.   III,IV, VI: Eyes are midline, difficulty checking oculocephalics due to supportive equipment V: VII: Corneals are intact Motor: He has extensor posturing of the right arm, triple flexion versus flexion bilateral lower extremities, flexion versus withdrawal of the left upper extremity Sensory: As above Deep Tendon Reflexes: 3+ and symmetric in the biceps and patellae.  No clonus Plantars: Toes are upgoing bilaterally Cerebellar: Does not perform   I have reviewed labs in epic and the results pertinent to this consultation are: Creatinine 6.58  I have reviewed the images obtained: CT head initially unremarkable  Impression: 48 year old male with likely anoxic injury status post cardiac arrest.  He does need head imaging, and I would like to rule out seizure activity with EEG.  Given the upper gaze deviation and concern for event dyssynchrony that could be ictal, I will monitor overnight seizures.  Recommendations: 1) CT head 2) overnight EEG 3) neurology will follow   Roland Rack, MD [facility] [phone]  If 7pm- 7am, please page neurology on call as  listed in [place].

## 2019-08-04 NOTE — Progress Notes (Signed)
CCM NP and MD paged regarding patient's neuro status.  STAT head CT ordered after HD is complete.  Continuous EEG once back from CT.

## 2019-08-04 NOTE — Progress Notes (Signed)
Patient transported to CT and back without complications. RN at bedside.  

## 2019-08-04 NOTE — Plan of Care (Signed)
  Problem: Safety: Goal: Ability to remain free from injury will improve Outcome: Completed/Met

## 2019-08-04 NOTE — Progress Notes (Signed)
Per Dr. Leonel Ramsay pt is finishing dialysis and then having a STAT head CT done. After that he wants the EEG so ~45 minutes pt should be available.

## 2019-08-04 NOTE — Progress Notes (Signed)
Patient back from CT. Wife asked that I not do his CPT at this time.  Kathie Dike RRT

## 2019-08-04 NOTE — Progress Notes (Signed)
[facility] NEPHROLOGY PROGRESS NOTE  Assessment/ Plan: Pt is a 48 y.o. yo male with hypertension, DM, recent ESRD, underwent placement of PD catheter on 8/27, was receiving dialysis TTS at [facility], presented to [facility] ER with SOB and had a cardiac arrest.    # ESRD, recent start: TTS.  He has right IJ TDC.  Last HD on 8/30.  Patient has urine output of 815 cc in 24 hours.  Plan for HD today.  He will need clip. -Need to flush PD catheter.  # Anemia due to chronic disease:  Received blood transfusion.  Iron saturation only 3%.  Oredred IV iron x 5 days on 8/31. Start Aranesp.  Transfuse as needed.  # Secondary hyperparathyroidism: PTH 93 on 07/31/2019.  Recommend to change tube feeding to Nepro.  Monitor lab.  # HTN/volume blood pressure soft this morning.  Continue to monitor.  He may need temporary pressor support during dialysis.  #Cardiac arrest with elevated troponin  #Acute respiratory failure with hypoxia on ventilator: Per pulmonary team.  #Acute encephalopathy: Required sedation.  Discussed with ICU nurse.  Subjective: Seen and examined in ICU.  Sedated and intubated.  Plan for dialysis today.  Review of system is limited.  Objective Vital signs in last 24 hours: Vitals:   08/04/19 0700 08/04/19 0722 08/04/19 0728 08/04/19 0800  BP:      Pulse: 77     Resp:      Temp:  98.4 F (36.9 C)  98.6 F (37 C)  TempSrc:  Core  Bladder  SpO2: 100%  100%   Weight:      Height:       Weight change:   Intake/Output Summary (Last 24 hours) at 08/04/2019 0826 Last data filed at 08/04/2019 0700 Gross per 24 hour  Intake 2544.2 ml  Output 765 ml  Net 1779.2 ml       Labs: Basic Metabolic Panel: Recent Labs  Lab 08/02/19 2347  08/03/19 0437 08/03/19 0651 08/03/19 1816 08/04/19 0430  NA 135   < > 136 135  --  137  K 3.7   < > 4.5 4.1  --  3.6  CL 95*  --  96*  --   --  99  CO2 24  --  24  --   --  21*  GLUCOSE 129*  --  164*  --   --  131*  BUN 42*   --  43*  --   --  61*  CREATININE 5.11*  --  5.54*  --   --  6.58*  CALCIUM 8.0*  --  8.0*  --   --  7.0*  PHOS  --   --  8.6*  --  6.0* 6.1*   < > = values in this interval not displayed.   Liver Function Tests: Recent Labs  Lab 07/23/2019 0400 08/02/19 0436  AST 32 33  ALT 19 22  ALKPHOS 75 66  BILITOT 0.3 0.6  PROT 6.3* 5.7*  ALBUMIN 3.4* 2.9*   No results for input(s): LIPASE, AMYLASE in the last 168 hours. No results for input(s): AMMONIA in the last 168 hours. CBC: Recent Labs  Lab 07/22/2019 1127  07/06/2019 2145  08/02/19 0436  08/03/19 0437 08/03/19 0651 08/04/19 0430  WBC 14.2*  --  9.7  --  10.8*  --  11.9*  --  8.1  NEUTROABS  --   --  7.6  --   --   --  9.8*  --  6.3  HGB 7.6*   < > 6.7*   < > 8.3*   < > 9.2* 8.2* 7.4*  HCT 25.1*   < > 20.8*   < > 26.1*   < > 31.2* 24.0* 24.1*  MCV 93.7  --  90.4  --  91.3  --  96.9  --  92.7  PLT 287  --  227  --  250  --  308  --  284   < > = values in this interval not displayed.   Cardiac Enzymes: No results for input(s): CKTOTAL, CKMB, CKMBINDEX, TROPONINI in the last 168 hours. CBG: Recent Labs  Lab 08/03/19 1547 08/03/19 1934 08/03/19 2336 08/04/19 0351 08/04/19 0723  GLUCAP 172* 125* 122* 123* 117*    Iron Studies:  Recent Labs    07/27/2019 1336  IRON 8*  TIBC 307   Studies/Results: Dg Chest Port 1 View  Result Date: 08/03/2019 CLINICAL DATA:  Endotracheal tube present.  Cardiac arrest. EXAM: PORTABLE CHEST 1 VIEW COMPARISON:  One-view chest x-ray 08/02/2019 FINDINGS: Heart size is exaggerated by low lung volumes. The endotracheal tube terminates 13 mm above the carina and could be pulled back 1-2 cm for more optimal positioning. A left subclavian line is stable. Right IJ dialysis catheter is stable. Overall aeration is slightly improved. Lung volumes remain low. Right greater than left basilar airspace opacities remain. IMPRESSION: 1. Improving aeration of both lungs with persistent interstitial and airspace  opacities, right greater than left. 2. Endotracheal tube is somewhat low, terminating only 1.3 cm above the carina. It could be pulled back 1-2 cm for more optimal positioning. 3. Cardiomegaly and stable mild pulmonary vascular congestion. 4. Aortic atherosclerosis. 5. Support apparatus is otherwise stable. Electronically Signed   By: San Morelle M.D.   On: 08/03/2019 06:04    Medications: Infusions: . sodium chloride Stopped (07/21/2019 1242)  . sodium chloride 10 mL/hr at 08/04/19 0700  . sodium chloride    . sodium chloride    . ampicillin-sulbactam (UNASYN) IV Stopped (08/03/19 2225)  . cisatracurium (NIMBEX) infusion 2 mcg/kg/min (08/04/19 0700)  . fentaNYL infusion INTRAVENOUS 100 mcg/hr (08/04/19 0700)  . ferric gluconate (FERRLECIT/NULECIT) IV Stopped (08/03/19 1200)  . phenylephrine (NEO-SYNEPHRINE) Adult infusion    . propofol (DIPRIVAN) infusion 50 mcg/kg/min (08/04/19 0700)    Scheduled Medications: . acetaminophen (TYLENOL) oral liquid 160 mg/5 mL  650 mg Per Tube Q6H  . artificial tears  1 application Both Eyes I5O  . chlorhexidine gluconate (MEDLINE KIT)  15 mL Mouth Rinse BID  . Chlorhexidine Gluconate Cloth  6 each Topical Q0600  . Chlorhexidine Gluconate Cloth  6 each Topical Daily  . Chlorhexidine Gluconate Cloth  6 each Topical Q0600  . feeding supplement (PRO-STAT SUGAR FREE 64)  60 mL Per Tube TID  . feeding supplement (VITAL HIGH PROTEIN)  1,000 mL Per Tube Q24H  . heparin  5,000 Units Subcutaneous Q8H  . insulin aspart  0-15 Units Subcutaneous Q4H  . mouth rinse  15 mL Mouth Rinse 10 times per day  . pantoprazole (PROTONIX) IV  40 mg Intravenous QHS  . sodium chloride flush  10-40 mL Intracatheter Q12H  . sodium chloride flush  3 mL Intravenous Q12H    have reviewed scheduled and prn medications.  Physical Exam: General: Sedated, intubated Heart:RRR, s1s2 nl, no rubs Lungs: Coarse breath sound bilateral, no wheezing Abdomen:soft, mild distention,  PD catheter site looks clean Extremities: Trace lower extremity edema Dialysis Access: Right IJ tunnel catheter,  has a PD catheter site clean.  Dron Reesa Chew Bhandari 08/04/2019,8:26 AM  LOS: 3 days  Pager: [phone]

## 2019-08-04 NOTE — Progress Notes (Signed)
EEG complete - results pending 

## 2019-08-04 NOTE — Progress Notes (Signed)
[name] progress note  CT head shows development of severe brain edema consistent with anoxic injury Discussed with wife about findings at bedside. Prognosis for neurologic recovery is extremely poor We will await formal recommendations from neurology consult  Wife wants to discuss with the rest of the family for deciding on the next steps.  The patient is critically ill with multiple organ system failure and requires high complexity decision making for assessment and support, frequent evaluation and titration of therapies, advanced monitoring, review of radiographic studies and interpretation of complex data.   Critical Care Time devoted to patient care services, exclusive of separately billable procedures, described in this note is 35 minutes.   Marshell Garfinkel MD [place] [facility] Pager [phone] If no answer call [phone] 08/04/2019, 5:36 PM

## 2019-08-04 NOTE — Progress Notes (Signed)
vLTM started neurology notified  Push button tested

## 2019-08-04 DEATH — deceased

## 2019-08-05 ENCOUNTER — Inpatient Hospital Stay (HOSPITAL_COMMUNITY): Payer: 59

## 2019-08-05 DIAGNOSIS — J9600 Acute respiratory failure, unspecified whether with hypoxia or hypercapnia: Secondary | ICD-10-CM

## 2019-08-05 DIAGNOSIS — G931 Anoxic brain damage, not elsewhere classified: Secondary | ICD-10-CM

## 2019-08-05 DIAGNOSIS — N186 End stage renal disease: Secondary | ICD-10-CM

## 2019-08-05 LAB — GLUCOSE, CAPILLARY
Glucose-Capillary: 107 mg/dL — ABNORMAL HIGH (ref 70–99)
Glucose-Capillary: 123 mg/dL — ABNORMAL HIGH (ref 70–99)
Glucose-Capillary: 165 mg/dL — ABNORMAL HIGH (ref 70–99)
Glucose-Capillary: 183 mg/dL — ABNORMAL HIGH (ref 70–99)
Glucose-Capillary: 196 mg/dL — ABNORMAL HIGH (ref 70–99)
Glucose-Capillary: 212 mg/dL — ABNORMAL HIGH (ref 70–99)

## 2019-08-05 LAB — CBC WITH DIFFERENTIAL/PLATELET
Abs Immature Granulocytes: 0.16 10*3/uL — ABNORMAL HIGH (ref 0.00–0.07)
Basophils Absolute: 0 10*3/uL (ref 0.0–0.1)
Basophils Relative: 0 %
Eosinophils Absolute: 0.1 10*3/uL (ref 0.0–0.5)
Eosinophils Relative: 1 %
HCT: 29.5 % — ABNORMAL LOW (ref 39.0–52.0)
Hemoglobin: 8.8 g/dL — ABNORMAL LOW (ref 13.0–17.0)
Immature Granulocytes: 2 %
Lymphocytes Relative: 7 %
Lymphs Abs: 0.7 10*3/uL (ref 0.7–4.0)
MCH: 28.4 pg (ref 26.0–34.0)
MCHC: 29.8 g/dL — ABNORMAL LOW (ref 30.0–36.0)
MCV: 95.2 fL (ref 80.0–100.0)
Monocytes Absolute: 1.1 10*3/uL — ABNORMAL HIGH (ref 0.1–1.0)
Monocytes Relative: 11 %
Neutro Abs: 7.8 10*3/uL — ABNORMAL HIGH (ref 1.7–7.7)
Neutrophils Relative %: 79 %
Platelets: 332 10*3/uL (ref 150–400)
RBC: 3.1 MIL/uL — ABNORMAL LOW (ref 4.22–5.81)
RDW: 14.4 % (ref 11.5–15.5)
WBC: 9.9 10*3/uL (ref 4.0–10.5)
nRBC: 0 % (ref 0.0–0.2)

## 2019-08-05 LAB — BASIC METABOLIC PANEL
Anion gap: 18 — ABNORMAL HIGH (ref 5–15)
BUN: 43 mg/dL — ABNORMAL HIGH (ref 6–20)
CO2: 24 mmol/L (ref 22–32)
Calcium: 7.5 mg/dL — ABNORMAL LOW (ref 8.9–10.3)
Chloride: 95 mmol/L — ABNORMAL LOW (ref 98–111)
Creatinine, Ser: 5.3 mg/dL — ABNORMAL HIGH (ref 0.61–1.24)
GFR calc Af Amer: 14 mL/min — ABNORMAL LOW (ref >60)
GFR calc non Af Amer: 12 mL/min — ABNORMAL LOW (ref >60)
Glucose, Bld: 144 mg/dL — ABNORMAL HIGH (ref 70–99)
Potassium: 4.7 mmol/L (ref 3.5–5.1)
Sodium: 137 mmol/L (ref 135–145)

## 2019-08-05 LAB — PHOSPHORUS: Phosphorus: 8.2 mg/dL — ABNORMAL HIGH (ref 2.5–4.6)

## 2019-08-05 LAB — MAGNESIUM: Magnesium: 2.5 mg/dL — ABNORMAL HIGH (ref 1.7–2.4)

## 2019-08-05 LAB — TROPONIN I (HIGH SENSITIVITY): Troponin I (High Sensitivity): 77 ng/L — ABNORMAL HIGH (ref <18)

## 2019-08-05 MED ORDER — CALCIUM ACETATE (PHOS BINDER) 667 MG/5ML PO SOLN
667.0000 mg | Freq: Three times a day (TID) | ORAL | Status: DC
  Administered 2019-08-05 – 2019-08-07 (×6): 667 mg
  Filled 2019-08-05 (×7): qty 5

## 2019-08-05 MED ORDER — NEPRO/CARBSTEADY PO LIQD: 1000.0000 mL | ORAL | Status: DC

## 2019-08-05 MED ORDER — DIPHENHYDRAMINE HCL 50 MG/ML IJ SOLN
25.0000 mg | Freq: Three times a day (TID) | INTRAMUSCULAR | Status: AC
  Administered 2019-08-05 (×3): 25 mg via INTRAVENOUS
  Filled 2019-08-05 (×3): qty 1

## 2019-08-05 MED ORDER — FAMOTIDINE IN NACL 20-0.9 MG/50ML-% IV SOLN
20.0000 mg | INTRAVENOUS | Status: DC
  Administered 2019-08-05 – 2019-08-06 (×2): 20 mg via INTRAVENOUS
  Filled 2019-08-05 (×2): qty 50

## 2019-08-05 MED ORDER — METHYLPREDNISOLONE SODIUM SUCC 40 MG IJ SOLR
40.0000 mg | Freq: Three times a day (TID) | INTRAMUSCULAR | Status: DC
  Administered 2019-08-05 – 2019-08-06 (×4): 40 mg via INTRAVENOUS
  Filled 2019-08-05 (×4): qty 1

## 2019-08-05 MED ORDER — CHLORHEXIDINE GLUCONATE CLOTH 2 % EX PADS
6.0000 | MEDICATED_PAD | Freq: Every day | CUTANEOUS | Status: DC
  Administered 2019-08-05 – 2019-08-25 (×19): 6 via TOPICAL

## 2019-08-05 MED ORDER — CHLORHEXIDINE GLUCONATE CLOTH 2 % EX PADS
6.0000 | MEDICATED_PAD | Freq: Every day | CUTANEOUS | Status: DC
  Administered 2019-08-07: 06:00:00 6 via TOPICAL

## 2019-08-05 MED ORDER — NEPRO/CARBSTEADY PO LIQD
1000.0000 mL | ORAL | Status: DC
  Administered 2019-08-05 – 2019-08-10 (×6): 1000 mL

## 2019-08-05 NOTE — Progress Notes (Addendum)
PULMONARY / CRITICAL CARE MEDICINE   NAME:  Larry Banks, MRN:  [id number], DOB:  [birth year], LOS: 4 ADMISSION DATE:  08/03/2019, CONSULTATION DATE:  07/18/2019  REFERRING MD:  EDP Dr. Tomi Bamberger  , CHIEF COMPLAINT:  Cardiac Arrest/Resp Failure   BRIEF HISTORY:    48 year old male with medical history significant for hypertension, PAD ,hyperlipidemia, CKD Stage 5 developed shortness of breath at home.  Patient does have CKD Stage 5 .  Is followed at [facility].  Recently had peritoneal dialysis catheter placed on August 27. Has not started Dialysis yet.  On arrival to the emergency room patient suffered a cardiac arrest with asystole.  He received CPR x15 minutes.  He was given the hyperkalemic protocol due to his underlying chronic kidney disease with 1 amp bicarb, 1 amp D50, insulin 10 units regular IV.  He received epi x3.  He had ROSC after 15 minutes.  He did require very brief Neo-Synephrine for hypotension.  He had significant agitation was started on sedation with Versed and Fentanyl.    Patient was transported to [facility] ICU for [name] to admit.  Remains intubated without significant neurologic recovery. EEG and CT suggestive of severe anoxic brain injury  SIGNIFICANT PAST MEDICAL HISTORY   Hypertension, hyperlipidemia, PAD, chronic kidney disease stage V  SIGNIFICANT EVENTS:  8/26-  TTS HD - last 07/29/2019 [name] (old tunneled Rt HD cah) 8/27 - PD cath - for a planned change from iHD to PD 8/29- Cardiac arrest, CPR x15 minutes, epi x3. Per renal CXR- c/w pulmonary edema 8/31- 1 unit PRBC, paralyzed due to vent dysynchrony 9/1- off pressors  STUDIES:   8/29 CT head  > no acute abnormality 8/29 CT abdomen pelvis > bilateral lower lobe consolidation with small pleural effusion.  Groundglass opacities lower portion of the lung.  Nonobstructive renal calculus, 8/29 Echo > ef 45-50%, hypokineses of the mid-inferior and lateral wall 8/29 - EEG> diffuse slowing and generalized  suppression suggestive of profound encephalopathy, no seizure activity 9/1 CT head>severe diffuse brain edema compatible with widespread hypoxic injury  CULTURES:  8/29 BC >> 8/29 Sputum >> Rare GNR, GPC>> Normal Flora 8/29 COVID 19 >> NEG   ANTIBIOTICS:  Unasyn 8/29 >  LINES/TUBES:  Peritoneal cath 8/27 (PTA )  R HD cath ? >>  ------- 8/29 ETT >>  8/29 =- CVL 8/29 a line  CONSULTANTS:  Renal  Neurology  SUBJECTIVE:  Some tongue swelling overnight, remains intubated and unresponsive, now off pressors.  No seizures on EEG.   CONSTITUTIONAL: BP 97/86   Pulse (!) 106   Temp 99.1 F (37.3 C)   Resp (!) 22   Ht 5\' 6"  (1.676 m)   Wt 99.7 kg Comment: TTM pads off   SpO2 97%   BMI 35.48 kg/m   I/O last 3 completed shifts: In: 2717.2 [I.V.:1592.4; NG/GT:881.3; IV Piggyback:243.4] Out: 3628 [Urine:1125; Other:2503]     Vent Mode: PRVC FiO2 (%):  [40 %-50 %] 50 % Set Rate:  [22 bmp] 22 bmp Vt Set:  [510 mL] 510 mL PEEP:  [8 cmH20] 8 cmH20 Plateau Pressure:  [22 [id number] cmH20] 26 cmH20  General:  Comatose, sedated  HEENT: significant tongue edema, no obvious signs of trauma Neuro: unresponsive, R gaze dysconjugate, 2+ reflexes CV: s1s2, RRR no m/r/g PULM:  CTAB GI: soft, bsx4 active  Extremities: warm/dry, no significant edema  Skin: no rashes or lesions   RESOLVED PROBLEM LIST   ASSESSMENT AND PLAN  PEA arrest with acute hypoxic respiratory failure and anoxic brain injury -s/p CPR and ROSC, trop elevated but initial EKG did not indicate STEMI and patient was not taken to the cath lab -Transitioned from HD to PD and had missed dialysis the day of admission, presenting K was ok --s/p 36 degree TTM, now off pressors  -EEG without seizure activity -Echo will moderate hypokinesis of the mid-inferior and lateral wall, new since echo in 2019 P: -Poor neurologic recovery, off paralytics and remains unresponsive -Neuro following, follow recs and will need to  further discuss goals of care with family -Briefly on Nimbex for vent dysynchrony, continue wean sedation and continue full vent support with PRVC -VAP bundle    Possible PNA vs edema -Patchy L basilar infiltrate, CXR reviewed by me largely unchanged -leukocytosis improved, Respiratory culture nl flora P: -D/C Unasyn given possible allergic reaction -Follow BC    ESRD stage 5 -receiving HD at [facility], TTS -Peritoneal Cath placed on 8/27 to replace old R IJ -Received HD 9/1 P: -Continue HD per nephrology recommendations, monitor BMET    Angioedema -etiology unclear, no signs of systemic edema, rash or allergic reaction, no ACEI, on ARB at home P: -Benadryl, solumedrol 40mg  8 hr, continue to monitor  Anemia of chronic disease -s/p 1 unit PRBCs 8/29 -iron saturation 3% P: Follow hemoglobin Renal Ordered IV iron x 5 days starting  8/31.  Continue Aranesp Transfuse for HGB < 7   Type 2 DM -Cont SSI  Secondary Hyperparathyroidism PTH 93 on 8/28 Plan Switch TF to Nephro per Dietician  Trend PTH    Best Practice / Goals of Care / Disposition.   Diet: Tube feeds Pain/Anxiety/Delirium protocol (if indicated): propofol VAP protocol (if indicated): ordered DVT prophylaxis: hep SQ GI prophylaxis: PPI Glucose control: SSI Mobility: Bed Code Status: Full Family Communication: Will attempt to contact wife today Disposition: ICU  ATTESTATION & SIGNATURE     CRITICAL CARE Performed by: Otilio Carpen Haydin Calandra   Total critical care time: 45 minutes  Critical care time was exclusive of separately billable procedures and treating other patients.  Critical care was necessary to treat or prevent imminent or life-threatening deterioration secondary to respiratory failure and hypoxic brain injury.  Critical care was time spent personally by me on the following activities: development of treatment plan with patient and/or surrogate as well as nursing, discussions with  consultants, evaluation of patient's response to treatment, examination of patient, obtaining history from patient or surrogate, ordering and performing treatments and interventions, ordering and review of laboratory studies, ordering and review of radiographic studies, pulse oximetry and re-evaluation of patient's condition.    Otilio Carpen Lachanda Buczek, PA-C  [place] [name] Pager# [phone]

## 2019-08-05 NOTE — TOC Initial Note (Signed)
Transition of Care [facility]) - Initial/Assessment Note    Patient Details  Name: Larry Banks MRN: [id number] Date of Birth: [birth year]  Transition of Care [facility]) CM/SW Contact:    Bethena Roys, RN Phone Number: 08/05/2019, 2:21 PM  Clinical Narrative:  Pt presented for cardiac arrest. Hx of Hypertension, Diabetes, ESRD. PTA from home with spouse. Per wife pt has PCP-CM did discuss with wife regarding insurance via [employer]-short term disability claim. CM will continue to monitor for transition of care needs.             Expected Discharge Plan: [place] Barriers to Discharge: Continued Medical Work up   Expected Discharge Plan and Services Expected Discharge Plan: [place]   Discharge Planning Services: CM Consult Post Acute Care Choice: Dialysis(Dialysis outpatient- new prior to admission.) Living arrangements for the past 2 months: Single Family Home                  Prior Living Arrangements/Services Living arrangements for the past 2 months: Single Family Home Lives with:: Spouse          Need for Family Participation in Patient Care: Yes (Comment) Care giver support system in place?: Yes (comment)   Criminal Activity/Legal Involvement Pertinent to Current Situation/Hospitalization: No - Comment as needed  Activities of Daily Living Home Assistive Devices/Equipment: None ADL Screening (condition at time of admission) Patient's cognitive ability adequate to safely complete daily activities?: Yes Is the patient deaf or have difficulty hearing?: No Does the patient have difficulty seeing, even when wearing glasses/contacts?: No Does the patient have difficulty concentrating, remembering, or making decisions?: No Patient able to express need for assistance with ADLs?: No Does the patient have difficulty dressing or bathing?: No Independently performs ADLs?: Yes (appropriate for developmental age) Does the patient have  difficulty walking or climbing stairs?: No Weakness of Legs: None Weakness of Arms/Hands: None  Permission Sought/Granted Permission sought to share information with : Family Supports    Emotional Assessment Appearance:: Appears stated age Attitude/Demeanor/Rapport: Unable to Assess Affect (typically observed): Unable to Assess   Alcohol / Substance Use: Not Applicable Psych Involvement: No (comment)  Admission diagnosis:  Cardiac arrest ([place]) [I46.9] Acute pulmonary edema (HCC) [J81.0] ESRD needing dialysis ([place]) [N18.6, Z99.2] Patient Active Problem List   Diagnosis Date Noted  . Acute respiratory failure ([place])   . Cardiac arrest ([place]) 07/10/2019  . Encounter for central line placement    PCP:  Curlene Labrum, MD Pharmacy:   [id number] - [place], [address] [place] [address] Phone: [phone] Fax: [phone]   Social Determinants of Health (SDOH) Interventions    Readmission Risk Interventions Readmission Risk Prevention Plan 08/05/2019  Transportation Screening Complete  HRI or Home Care Consult Complete  Medication Review (RN Care Manager) Complete  Some recent data might be hidden

## 2019-08-05 NOTE — Progress Notes (Signed)
LTM maint complete - no skin breakdown under: F7,F8, A1

## 2019-08-05 NOTE — Progress Notes (Signed)
Patient's wife asked me not to do CPT at this time.  Kathie Dike RRT

## 2019-08-05 NOTE — Progress Notes (Addendum)
[place] Progress Note Patient Name: Larry Banks DOB: 17-May-1971 MRN: [id number]   Date of Service  08/05/2019  HPI/Events of Note  Notified by RN regarding worsening tongue swelling.   48/M with ESRD, who had a cardiac arrest, noted to have severe brain edema, consistent with anoxic brain injury.  Pt is intubated. Airway is patent.   eICU Interventions  Continue to monitor for now.        Intervention Category Intermediate Interventions: Other:  Elsie Lincoln 08/05/2019, 4:09 AM   6:20 AM Informed by RN that patient became hypotensive with SBP in the 70s to 80s.  Plan> Ok to restart levophed.

## 2019-08-05 NOTE — Procedures (Signed)
Patient Name: Larry Banks  MRN: [id number]  Epilepsy Attending: Lora Havens  Referring Physician/Provider: Dr Roland Rack Duration: 08/04/2019 1821 to 08/05/2019 1518  Patient history:48yo M with cardiac arrest and seizure like episode. EEG to evaluate for seizure.  Level of alertness:comatose  AEDs during EEG study:versed  Technical aspects: This EEG study was done with scalp electrodes positioned according to the 10-20 International system of electrode placement. Electrical activity was acquired at a sampling rate of 500Hz  and reviewed with a high frequency filter of 70Hz  and a low frequency filter of 1Hz . EEG data were recorded continuously and digitally stored.  DESCRIPTION: EEG showed continuous generalized2-3 hz low voltage delta slowing as well as generalized suppression. EEG was not reactive to stimulation.Hyperventilation and photic stimulation were not performed.  IMPRESSION:  This study issuggestive ofprofound diffuse encephalopathy,could besecondary to anoxic/hypoxic brain injury.No seizures or epileptiform discharges were seen throughout the recording.   Lora Havens, MD

## 2019-08-05 NOTE — Progress Notes (Signed)
Nutrition Follow-up  DOCUMENTATION CODES:   Obesity unspecified  INTERVENTION:   Continue tube feeding: - Nepro @ 20 ml/hr (480 ml/day) via OG tube - Pro-stat 60 ml TID  Tube feeding regimen provides 1464 kcal, 129 grams of protein, and 349 ml of H2O (>100% of kcal needs, 100% of protein needs).  NUTRITION DIAGNOSIS:   Increased nutrient needs related to chronic illness (ESRD on dialysis) as evidenced by estimated needs.  Ongoing, being addressed via TF  GOAL:   Provide needs based on ASPEN/SCCM guidelines  Met via TF  MONITOR:   Vent status, TF tolerance, Labs, Weight trends, I & O's  REASON FOR ASSESSMENT:   Consult Enteral/tube feeding initiation and management  ASSESSMENT:   48 year old male with medical history significant for hypertension, PAD, hyperlipidemia, CKD-stage 5 presented to [facility] emergency room with respiratory distress/hypoxia. On arrival to ER suffered a cardiac arrest with asystole with ROSC in 15 minutes.  Discussed pt with RN and during ICU rounds. Propofol off.  Per CCM note 9/01, CT head showing development of severe brain edema consistent with anoxic injury. Further [place] discussions needed with family.  S/p HD on 9/01 with UF 2.5 L, requiring some pressors.  OG tube remains in place with tube feeding infusing.  Weight is 15 lbs above admit weight. EDW: 92.6 kg.  Patient is currently intubated on ventilator support MV: 9.0 L/min Temp (24hrs), Avg:100.1 F (37.8 C), Min:97.8 F (36.6 C), Max:101.8 F (38.8 C) BP (cuff): 106/71 MAP (cuff): 82  Drips: NS: 10 ml/hr Fentanyl: 10 ml/hr Levophed: off this AM  Medications reviewed and include: Phoslyra 667 mg TID, SSI q 4 hours, Solu-medrol, IV Pepcid  Labs reviewed: phosphorus 8.2, magnesium 2.5, triglycerides 339, hemoglobin 8.8 CBG's: 107-212 x 24 hours  UOP: 600 ml x 24 hours HD net UF 9/01: 2503 ml I/O's: -1.3 L since admit  NUTRITION - FOCUSED PHYSICAL EXAM:    Most  Recent Value  Orbital Region  No depletion  Upper Arm Region  No depletion  Thoracic and Lumbar Region  No depletion  Buccal Region  No depletion  Temple Region  No depletion  Clavicle Bone Region  No depletion  Clavicle and Acromion Bone Region  No depletion  Scapular Bone Region  No depletion  Dorsal Hand  No depletion  Patellar Region  No depletion  Anterior Thigh Region  No depletion  Posterior Calf Region  No depletion  Edema (RD Assessment)  Mild [BLE]  Hair  Reviewed  Eyes  Unable to assess  Mouth  Reviewed  Skin  Reviewed  Nails  Reviewed       Diet Order:   Diet Order            Diet NPO time specified  Diet effective now              EDUCATION NEEDS:   Not appropriate for education at this time  Skin:  Skin Assessment: Skin Integrity Issues: Other: pressure injury to tongue  Last BM:  08/05/19  Height:   Ht Readings from Last 1 Encounters:  08/03/19 '5\' 6"'$  (1.676 m)    Weight:   Wt Readings from Last 1 Encounters:  08/05/19 99.7 kg    Ideal Body Weight:  64.5 kg  BMI:  Body mass index is 35.48 kg/m.  Estimated Nutritional Needs:   Kcal:  3875-6433  Protein:  129-140 grams  Fluid:  UOP + 1000 ml    Gaynell Face, MS, RD, LDN Inpatient Clinical  Dietitian Pager: [phone] Weekend/After Hours: [phone]

## 2019-08-05 NOTE — Progress Notes (Signed)
48 year old hypertensive, diabetic, new ESRD status post placement of PD catheter on 8/27 admitted via [facility], ED with PEA cardiac arrest on 8/29 He underwent normothermia protocol but required paralytics for vent dyssynchrony.  When paralytic was stopped on 9/1 he developed upward gaze and stiff with concern for seizure, head CT showed cerebral edema, EEG was negative, kept on overnight LTM EEG. He developed tongue swelling on 9/1.  Febrile 38 1, remains comatose, blood pressure soft but improving, decreased breath sounds bilateral, extensor posturing to deep pain stimulus, does not follow commands, brisk corneals and conjunctival reflex, S1-S2 normal, no rub , large tongue swelling, no obvious bleeding or ecchymosis  Chest x-ray personally reviewed which shows ET tube in position, suggest left lower lobe pneumonia  Labs show normal electrolytes, anemia, rising phosphorus, and creatinine.  Impression/plan  Anoxic encephalopathy-can wean Versed to off since no evidence of seizure on LTM EEG, continue fentanyl and can add Precedex if blood pressure tolerates Continue neurochecks and attempt to prognosticate Control fevers with Tylenol, pads have been taken off now  Tongue swelling-this may be related to tongue bite or allergic reaction, will use IV Solu-Medrol, and Benadryl for 3 doses and Pepcid 20 every 12 May have to consider stopping Unasyn which is being given for aspiration pneumonia  Acute respiratory failure with hypoxia-drop PEEP to 5, drop respiratory rate to 18 and attempt spontaneous breathing trials  ESRD-blood pressure is now improved so hopefully can avoid CRRT here and do regular hemodialysis  The patient is critically ill with multiple organ systems failure and requires high complexity decision making for assessment and support, frequent evaluation and titration of therapies, application of advanced monitoring technologies and extensive interpretation of multiple databases.  Critical Care Time devoted to patient care services described in this note independent of APP/resident  time is 35 minutes.   Leanna Sato [place] MD

## 2019-08-05 NOTE — Progress Notes (Signed)
[name] Brief Note  Patient with increasing anasarca. Wedding ring remains in place on L ring finger. Wedding ring successfully removed without complication. Ring given to RN, Cybill who is placing ring in secure location.    Eliseo Gum MSN, AGACNP-BC [place] [phone] If no answer, [phone] 08/05/2019, 9:54 AM

## 2019-08-05 NOTE — Progress Notes (Signed)
[facility] NEPHROLOGY PROGRESS NOTE  Assessment/ Plan: Pt is a 48 y.o. yo male with hypertension, DM, recent ESRD, underwent placement of PD catheter on 8/27, was receiving dialysis TTS at [facility], presented to [facility] ER with SOB and had a cardiac arrest.    # ESRD, recent start: TTS.  He has right IJ TDC.  Last HD on 8/30.  Patient has urine output of 600 cc in 24 hours. S/p HD yesterday with UF 2.5 L, required some pressors during HD.  Plan for HD tomorrow, he may need CRRT if remains hypotensive.   -Need to flush PD catheter.  # Anemia due to chronic disease:  Received blood transfusion.  Iron saturation only 3%.  Oredred IV iron x 5 days on 8/31. Start Aranesp.  Transfuse as needed.  # Secondary hyperparathyroidism: PTH 93 on 07/21/2019. Changed tube feeding to Nepro.  Phos high, start phoslyra.   # HTN/volume blood pressure soft this morning.  Continue to monitor. Weaned pressor this morning.   #Cardiac arrest with elevated troponin  #Acute respiratory failure with hypoxia on ventilator: Per pulmonary team.  #Acute encephalopathy/acute anoxic brain injury: Seen by neurology.  Seems poor long-term prognosis.  Discussed with ICU nurse.  Subjective: Seen and examined in ICU.  Sedated and intubated.  Plan for dialysis today.  Review of system is limited.  Objective Vital signs in last 24 hours: Vitals:   08/05/19 0630 08/05/19 0645 08/05/19 0700 08/05/19 0739  BP: (!) 104/92 (!) 129/102 97/86   Pulse: (!) 106 (!) 116 (!) 106   Resp:      Temp: 98.6 F (37 C) 99.1 F (37.3 C)    TempSrc:      SpO2: 90% (!) 84% 97% 96%  Weight:      Height:       Weight change: -6.4 kg  Intake/Output Summary (Last 24 hours) at 08/05/2019 0806 Last data filed at 08/05/2019 0700 Gross per 24 hour  Intake 1422.17 ml  Output 3103 ml  Net -1680.83 ml       Labs: Basic Metabolic Panel: Recent Labs  Lab 08/03/19 0437 08/03/19 0651  08/04/19 0430 08/04/19 1701  08/05/19 0309  NA 136 135  --  137  --  137  K 4.5 4.1  --  3.6  --  4.7  CL 96*  --   --  99  --  95*  CO2 24  --   --  21*  --  24  GLUCOSE 164*  --   --  131*  --  144*  BUN 43*  --   --  61*  --  43*  CREATININE 5.54*  --   --  6.58*  --  5.30*  CALCIUM 8.0*  --   --  7.0*  --  7.5*  PHOS 8.6*  --    < > 6.1* 4.9* 8.2*   < > = values in this interval not displayed.   Liver Function Tests: Recent Labs  Lab 07/25/2019 0400 08/02/19 0436  AST 32 33  ALT 19 22  ALKPHOS 75 66  BILITOT 0.3 0.6  PROT 6.3* 5.7*  ALBUMIN 3.4* 2.9*   No results for input(s): LIPASE, AMYLASE in the last 168 hours. No results for input(s): AMMONIA in the last 168 hours. CBC: Recent Labs  Lab 07/25/2019 2145  08/02/19 0436  08/03/19 0437 08/03/19 0651 08/04/19 0430 08/05/19 0309  WBC 9.7  --  10.8*  --  11.9*  --  8.1  9.9  NEUTROABS 7.6  --   --   --  9.8*  --  6.3 7.8*  HGB 6.7*   < > 8.3*   < > 9.2* 8.2* 7.4* 8.8*  HCT 20.8*   < > 26.1*   < > 31.2* 24.0* 24.1* 29.5*  MCV 90.4  --  91.3  --  96.9  --  92.7 95.2  PLT 227  --  250  --  308  --  284 332   < > = values in this interval not displayed.   Cardiac Enzymes: No results for input(s): CKTOTAL, CKMB, CKMBINDEX, TROPONINI in the last 168 hours. CBG: Recent Labs  Lab 08/04/19 1659 08/04/19 1954 08/04/19 2350 08/05/19 0343 08/05/19 0724  GLUCAP 136* 148* 118* 123* 107*    Iron Studies:  No results for input(s): IRON, TIBC, TRANSFERRIN, FERRITIN in the last 72 hours. Studies/Results: Ct Head Wo Contrast  Result Date: 08/04/2019 CLINICAL DATA:  Cardiac arrest follow-up EXAM: CT HEAD WITHOUT CONTRAST TECHNIQUE: Contiguous axial images were obtained from the base of the skull through the vertex without intravenous contrast. COMPARISON:  CT 07/29/2019 FINDINGS: Brain: Interval development of diffuse brain edema with loss of gray-white differentiation and effacement of sulci. Decreased ventricular size compared to the prior study. Edema  seen throughout both cerebral hemispheres and in the cerebellum bilaterally. Partial effacement of basilar cisterns. No herniation. Negative for hemorrhage. Vascular: Negative for hyperdense vessel Skull: Negative Sinuses/Orbits: Air-fluid levels paranasal sinuses.  Negative orbit Other: None IMPRESSION: Interval development of severe diffuse brain edema compatible with widespread hypoxic injury. Negative for hemorrhage. These results were called by telephone at the time of interpretation on 08/04/2019 at 4:37 pm to Rexene Edison, NP , who verbally acknowledged these results. Electronically Signed   By: Franchot Gallo M.D.   On: 08/04/2019 16:46   Dg Chest Port 1 View  Result Date: 08/04/2019 CLINICAL DATA:  Intubated patient. Acute hypoxic respiratory failure in the setting of respiratory failure. EXAM: PORTABLE CHEST 1 VIEW COMPARISON:  Single view of the chest 08/03/2019 and 07/25/2019. FINDINGS: Support tubes and lines are unchanged. Patchy left basilar airspace disease is new since yesterday's exam. The lungs are otherwise clear. Heart size is enlarged. No pneumothorax. IMPRESSION: New patchy left basilar airspace disease could be due to atelectasis or less likely, pneumonia. Cardiomegaly without edema. Electronically Signed   By: Inge Rise M.D.   On: 08/04/2019 08:47    Medications: Infusions: . sodium chloride Stopped (07/07/2019 1242)  . sodium chloride 10 mL/hr at 08/05/19 0700  . sodium chloride    . sodium chloride    . sodium chloride    . sodium chloride    . ampicillin-sulbactam (UNASYN) IV Stopped (08/04/19 2150)  . cisatracurium (NIMBEX) infusion Stopped (08/04/19 1006)  . fentaNYL infusion INTRAVENOUS 100 mcg/hr (08/05/19 0700)  . ferric gluconate (FERRLECIT/NULECIT) IV Stopped (08/04/19 1154)  . midazolam 3 mg/hr (08/05/19 0700)  . norepinephrine (LEVOPHED) Adult infusion Stopped (08/05/19 1610)  . phenylephrine (NEO-SYNEPHRINE) Adult infusion      Scheduled  Medications: . acetaminophen (TYLENOL) oral liquid 160 mg/5 mL  650 mg Per Tube Q6H  . artificial tears  1 application Both Eyes R6E  . chlorhexidine gluconate (MEDLINE KIT)  15 mL Mouth Rinse BID  . Chlorhexidine Gluconate Cloth  6 each Topical Daily  . darbepoetin (ARANESP) injection - DIALYSIS  60 mcg Intravenous Q Tue-HD  . feeding supplement (NEPRO CARB STEADY)  1,000 mL Oral Q24H  . feeding supplement (PRO-STAT  SUGAR FREE 64)  60 mL Per Tube TID  . heparin  5,000 Units Subcutaneous Q8H  . insulin aspart  0-15 Units Subcutaneous Q4H  . mouth rinse  15 mL Mouth Rinse 10 times per day  . pantoprazole (PROTONIX) IV  40 mg Intravenous QHS  . sodium chloride flush  10-40 mL Intracatheter Q12H  . sodium chloride flush  3 mL Intravenous Q12H    have reviewed scheduled and prn medications.  Physical Exam: Unchanged General: Sedated, intubated Heart:RRR, s1s2 nl, no rubs Lungs: Coarse breath sound bilateral, no wheezing Abdomen:soft, mild distention, PD catheter site looks clean Extremities: Trace lower extremity edema Dialysis Access: Right IJ tunnel catheter, has a PD catheter site clean.  Giovannina Mun Reesa Chew Turner Baillie 08/05/2019,8:06 AM  LOS: 4 days  Pager: [phone]

## 2019-08-05 NOTE — Progress Notes (Signed)
Pt.'s PD catheter was flushed/drain with no issue. Dressing changed and site WNL.

## 2019-08-05 NOTE — Progress Notes (Addendum)
Subjective: No significant changes  Exam: Vitals:   08/05/19 0700 08/05/19 0739  BP: 97/86   Pulse: (!) 106   Resp:    Temp:    SpO2: 97% 96%   Gen: In bed, NAD Resp: non-labored breathing, no acute distress Abd: soft, nt  Neuro: MS: Does not open eyes or follow commands CN: Eyes are slightly disconjugate, pupils reactive bilaterally, corneals intact Motor: Minimal extensor posturing bilaterally, what appears to be triple flexion bilateral lower extremities.  Of note, noxious stimulation to supraorbital area provokes triple flexion bilaterally Sensory: As above  Pertinent Labs: Cr 5.3  Impression: 48 year old male with anoxic injury status post cardiac arrest.  He has diffuse edema on CT, though I still do see some cortex.  I strongly suspect at this point that he is going to do poorly, but would favor getting an MRI and waiting until 72 hours post rewarming (tomorrow) to give formal prognosis.  Recommendations: 1) MRI brain 2) can DC LTM EEG if no seizures  This patient is critically ill and at significant risk of neurological worsening, death and care requires constant monitoring of vital signs, hemodynamics,respiratory and cardiac monitoring, neurological assessment, discussion with family, other specialists and medical decision making of high complexity. I spent 35 minutes of neurocritical care time  in the care of  this patient. This was time spent independent of any time provided by nurse practitioner or PA.  Roland Rack, MD [facility] [phone]  If 7pm- 7am, please page neurology on call as listed in [place]. 08/05/2019  10:10 AM

## 2019-08-05 NOTE — Progress Notes (Signed)
vLTM discontinued  No skin breakdown 

## 2019-08-06 ENCOUNTER — Inpatient Hospital Stay (HOSPITAL_COMMUNITY): Payer: 59

## 2019-08-06 DIAGNOSIS — J9601 Acute respiratory failure with hypoxia: Principal | ICD-10-CM

## 2019-08-06 LAB — CULTURE, BLOOD (ROUTINE X 2)
Culture: NO GROWTH
Culture: NO GROWTH

## 2019-08-06 LAB — BASIC METABOLIC PANEL
Anion gap: 17 — ABNORMAL HIGH (ref 5–15)
BUN: 89 mg/dL — ABNORMAL HIGH (ref 6–20)
CO2: 22 mmol/L (ref 22–32)
Calcium: 7 mg/dL — ABNORMAL LOW (ref 8.9–10.3)
Chloride: 94 mmol/L — ABNORMAL LOW (ref 98–111)
Creatinine, Ser: 7.75 mg/dL — ABNORMAL HIGH (ref 0.61–1.24)
GFR calc Af Amer: 9 mL/min — ABNORMAL LOW (ref >60)
GFR calc non Af Amer: 7 mL/min — ABNORMAL LOW (ref >60)
Glucose, Bld: 214 mg/dL — ABNORMAL HIGH (ref 70–99)
Potassium: 5.6 mmol/L — ABNORMAL HIGH (ref 3.5–5.1)
Sodium: 133 mmol/L — ABNORMAL LOW (ref 135–145)

## 2019-08-06 LAB — CBC WITH DIFFERENTIAL/PLATELET
Abs Immature Granulocytes: 0.19 10*3/uL — ABNORMAL HIGH (ref 0.00–0.07)
Basophils Absolute: 0 10*3/uL (ref 0.0–0.1)
Basophils Relative: 0 %
Eosinophils Absolute: 0 10*3/uL (ref 0.0–0.5)
Eosinophils Relative: 0 %
HCT: 25 % — ABNORMAL LOW (ref 39.0–52.0)
Hemoglobin: 7.5 g/dL — ABNORMAL LOW (ref 13.0–17.0)
Immature Granulocytes: 2 %
Lymphocytes Relative: 6 %
Lymphs Abs: 0.5 10*3/uL — ABNORMAL LOW (ref 0.7–4.0)
MCH: 28.2 pg (ref 26.0–34.0)
MCHC: 30 g/dL (ref 30.0–36.0)
MCV: 94 fL (ref 80.0–100.0)
Monocytes Absolute: 0.4 10*3/uL (ref 0.1–1.0)
Monocytes Relative: 4 %
Neutro Abs: 7.7 10*3/uL (ref 1.7–7.7)
Neutrophils Relative %: 88 %
Platelets: 290 10*3/uL (ref 150–400)
RBC: 2.66 MIL/uL — ABNORMAL LOW (ref 4.22–5.81)
RDW: 13.8 % (ref 11.5–15.5)
WBC: 8.8 10*3/uL (ref 4.0–10.5)
nRBC: 0.2 % (ref 0.0–0.2)

## 2019-08-06 LAB — GLUCOSE, CAPILLARY
Glucose-Capillary: 193 mg/dL — ABNORMAL HIGH (ref 70–99)
Glucose-Capillary: 188 mg/dL — ABNORMAL HIGH (ref 70–99)
Glucose-Capillary: 209 mg/dL — ABNORMAL HIGH (ref 70–99)
Glucose-Capillary: 194 mg/dL — ABNORMAL HIGH (ref 70–99)
Glucose-Capillary: 228 mg/dL — ABNORMAL HIGH (ref 70–99)

## 2019-08-06 LAB — PHOSPHORUS: Phosphorus: 8 mg/dL — ABNORMAL HIGH (ref 2.5–4.6)

## 2019-08-06 LAB — MAGNESIUM: Magnesium: 2.6 mg/dL — ABNORMAL HIGH (ref 1.7–2.4)

## 2019-08-06 MED ORDER — FAMOTIDINE 20 MG PO TABS
10.0000 mg | ORAL_TABLET | Freq: Two times a day (BID) | ORAL | Status: DC
  Administered 2019-08-06 – 2019-08-25 (×38): 10 mg
  Filled 2019-08-06 (×38): qty 1

## 2019-08-06 MED ORDER — FENTANYL CITRATE (PF) 100 MCG/2ML IJ SOLN
25.0000 ug | INTRAMUSCULAR | Status: DC | PRN
  Administered 2019-08-06: 20:00:00 100 ug via INTRAVENOUS
  Administered 2019-08-06: 50 ug via INTRAVENOUS
  Administered 2019-08-07 – 2019-08-11 (×14): 100 ug via INTRAVENOUS
  Administered 2019-08-13: 50 ug via INTRAVENOUS
  Administered 2019-08-13 – 2019-08-16 (×11): 100 ug via INTRAVENOUS
  Filled 2019-08-06 (×28): qty 2

## 2019-08-06 MED ORDER — HEPARIN SODIUM (PORCINE) 1000 UNIT/ML IJ SOLN
3.2000 mL | Freq: Once | INTRAMUSCULAR | Status: AC
  Administered 2019-08-06: 11:00:00 3200 [IU] via INTRAVENOUS

## 2019-08-06 MED ORDER — METHYLPREDNISOLONE SODIUM SUCC 40 MG IJ SOLR
40.0000 mg | Freq: Three times a day (TID) | INTRAMUSCULAR | Status: AC
  Administered 2019-08-06 – 2019-08-07 (×3): 40 mg via INTRAVENOUS
  Filled 2019-08-06 (×3): qty 1

## 2019-08-06 MED ORDER — MIDAZOLAM HCL 2 MG/2ML IJ SOLN
2.0000 mg | INTRAMUSCULAR | Status: DC | PRN
  Administered 2019-08-06 – 2019-08-10 (×9): 2 mg via INTRAVENOUS
  Administered 2019-08-11: 15:00:00 4 mg via INTRAVENOUS
  Administered 2019-08-11 (×2): 2 mg via INTRAVENOUS
  Filled 2019-08-06 (×3): qty 2
  Filled 2019-08-06: qty 4
  Filled 2019-08-06 (×4): qty 2
  Filled 2019-08-06: qty 4
  Filled 2019-08-06 (×4): qty 2
  Filled 2019-08-06: qty 4

## 2019-08-06 MED ORDER — HEPARIN SODIUM (PORCINE) 1000 UNIT/ML IJ SOLN
INTRAMUSCULAR | Status: AC
  Filled 2019-08-06: qty 4

## 2019-08-06 NOTE — Progress Notes (Signed)
Dr. Elsworth Soho requests patient remain off of sedation. Patient desatted to 50's with movement this morning.  Sedation given. RN requests sedation for MRI later on.  OK to give Versed and Fentanyl for MRI if needed.

## 2019-08-06 NOTE — Progress Notes (Signed)
PULMONARY / CRITICAL CARE MEDICINE   NAME:  MEGAN YEE, MRN:  [id number], DOB:  [birth year], LOS: 5 ADMISSION DATE:  07/15/2019, CONSULTATION DATE:  07/26/2019  REFERRING MD:  EDP Dr. Tomi Bamberger  , CHIEF COMPLAINT:  Cardiac Arrest/Resp Failure   BRIEF HISTORY:    48 year old male with medical history significant for hypertension, PAD, hyperlipidemia, CKD Stage 5 developed shortness of breath at home.  Patient does have CKD Stage 5 .  Is followed at [facility].  Recently had peritoneal dialysis catheter placed on August 27. Has not started Dialysis yet.  On arrival to the emergency room patient suffered a cardiac arrest with asystole.  He received CPR x15 minutes.  He receieved hyperkalemic protocol due to his underlying chronic kidney disease with 1 amp bicarb, 1 amp D50, insulin 10 units regular IV.  He received epi x3.  He had ROSC after 15 minutes.  He did require very brief Neo-Synephrine for hypotension.  He had significant agitation was started on sedation with Versed and Fentanyl.    Patient was transported to [facility] ICU for [name] to admit.  Remains intubated without significant neurologic recovery. EEG and CT suggestive of severe anoxic brain injury  SIGNIFICANT PAST MEDICAL HISTORY   Hypertension, hyperlipidemia, PAD, chronic kidney disease stage V  SIGNIFICANT EVENTS:  8/26-  TTS HD - last 07/29/2019 [name] (old tunneled Rt HD cah) 8/27 - PD cath - for a planned change from iHD to PD 8/29- Cardiac arrest, CPR x15 minutes, epi x3. Per renal CXR- c/w pulmonary edema 8/31- 1 unit PRBC, paralyzed due to vent dysynchrony 9/1- off pressors  STUDIES:   8/29 CT head  > no acute abnormality 8/29 CT abdomen pelvis > bilateral lower lobe consolidation with small pleural effusion.  Groundglass opacities lower portion of the lung.  Nonobstructive renal calculus, 8/29 Echo > ef 45-50%, hypokineses of the mid-inferior and lateral wall 8/29 - EEG> diffuse slowing and generalized  suppression suggestive of profound encephalopathy, no seizure activity 9/1 CT head>severe diffuse brain edema compatible with widespread hypoxic injury 9/2 EEG: suggestive ofprofound diffuse encephalopathy,could besecondary to anoxic/hypoxic brain injury.No seizures or epileptiform discharges were seen throughout the recording.   CULTURES:  8/29 BC >>NGTD 8/29 Sputum >> Rare GNR, GPC>> Normal Flora 8/29 COVID 19 >> NEG  MRSA surveillance negative   ANTIBIOTICS:  Unasyn 8/29 >9/1  LINES/TUBES:  Peritoneal cath 8/27 (PTA )  R HD cath ? >>  ------- 8/29 ETT >>  8/29 CVL 8/29 A line  CONSULTANTS:  Renal  Neurology  SUBJECTIVE:  Diarrhea overnight otherwise no acute issues.  Low grade temp. Improved vent synchrony.  Tongue swelling persists.   CONSTITUTIONAL: BP 133/84   Pulse 72   Temp 98.3 F (36.8 C) (Oral)   Resp 18   Ht 5\' 6"  (1.676 m)   Wt 101.4 kg   SpO2 99%   BMI 36.08 kg/m   I/O last 3 completed shifts: In: 1683.3 [I.V.:1123.3; NG/GT:510; IV Piggyback:50] Out: 287 [Urine:285; Stool:2]  CVP:  [13 mmHg-14 mmHg] 14 mmHg  Vent Mode: PRVC FiO2 (%):  [40 %] 40 % Set Rate:  [18 bmp] 18 bmp Vt Set:  [510 mL] 510 mL PEEP:  [5 cmH20] 5 cmH20 Plateau Pressure:  [19 cmH20-25 cmH20] 20 cmH20  General: Critically ill-appearing, well-developed, well nourished.  Unresponsive. HENT: Normocephalic, PERRL. Moist mucus membranes.  Large tongue swelling with protrusion. Neck: No JVD. Trachea midline.  CV: RRR. S1S2. No MRG. +2 distal pulses Lungs: BBS present,  diminished bases, FNL, symmetrical. ABD: Rounded.  +BS x4. SNT/ND. No masses, guarding or rigidity GU: No Foley EXT: Anasarca Skin: PWD.  Anterior surfaces in tact. No rashes or lesions Neuro: Extensor posturing lower extremities only to deep noxious stimuli.  Does not withdraw in upper extremities.  Positive corneal reflex.    RESOLVED PROBLEM LIST  NA ASSESSMENT AND PLAN   PEA arrest with acute hypoxic  respiratory failure and anoxic brain injury -s/p CPR and ROSC, trop elevated but initial EKG did not indicate STEMI and patient was not taken to the cath lab -Transitioned from HD to PD and had missed dialysis the day of admission, presenting K was ok --s/p 36 degree TTM, remains off pressors  -EEG without seizure activity but profound diffuse encephalopathy. -Echo will moderate hypokinesis of the mid-inferior and lateral wall, new since echo in 2019 On low-dose midazolam and fentanyl for vent synchrony and analgesia P: -MRI brain per neurology recommendations -Neuro following, will need to further discuss goals of care with family -Continue ventilator support to prevent eminent deterioration and further organ dysfunction from hypoxemia and hypercarbia.   -Patient is at risk for sudden hypoxia, barotrauma and hemodynamic compromise.   -Maintain SpO2 greater than or equal to 90%. -Head of bed elevated 30 degrees. -Plateau pressures less than 30 cm H20.  -Follow chest x-ray, ABG prn  -SAT/SBT as tolerated. -Bronchial hygiene. -RT/bronchodilator protocol. -Neuroprotective measures: Maintain euthermia, euglycemia, eunatremia, normoxia, pCO2 35-40, nutrition and bowel regimen, seizure precautions, head of bed elevated     Possible PNA vs edema -B/L atelectasis CXR reviewed by me, remains essentially unchanged -leukocytosis resolved, Respiratory culture nl flora P: -Observe off antibiotics    ESRD stage 5 -receiving HD at [facility], TTS -Peritoneal Cath placed on 8/27 to replace old R IJ -Received HD 9/1 P: -Continue HD per nephrology recommendations   -HD today with goal 2L volume removal  -monitor renal panel   Angioedema -etiology unclear, no signs of systemic edema, rash or allergic reaction, no ACEI, on ARB at home P: -Continue benadryl, solumedrol 40mg  8 hr x 1 more day -continue to monitor  Anemia of chronic disease -s/p 1 unit PRBCs 8/29 -iron saturation  3% P: -Follow hemoglobin -Renal Ordered IV iron x 5 days starting  8/31.  -Continue Aranesp -Transfuse for HGB < 7   Type 2 DM P: -Cont SSI  Secondary Hyperparathyroidism PTH 93 on 8/28 P: -Nephro per Dietician  -Trend PTH  Diarrhea P FMS -monitor I/O   Best Practice / Goals of Care / Disposition.   Diet: Tube feeds per recs Pain/Anxiety/Delirium protocol (if indicated): fentanyl, midazolam VAP protocol (if indicated): ordered DVT prophylaxis: heparin SQ GI prophylaxis: PPI Glucose control: SSI Mobility: Bed Code Status: Full Family Communication: Will attempt to contact wife today Disposition: ICU  ATTESTATION & SIGNATURE     CRITICAL CARE  The patient is critically ill with respiratory failure and anoxic brain injury. He requires ongoing ICU for high complexity decision making, titration of high alert medications, ventilator management, titration of oxygen and interpretation of advanced monitoring.    I personally spent 35 minutes providing critical care services including personally reviewing test results, discussing care with nursing staff/other physicians and completing orders pertaining to this patient.  Time was exclusive to the patient and does not include time spent teaching or in procedures.  Voice recognition software was used in the production of this record.  Errors in interpretation may have been inadvertently missed during review.  Francine Graven,  MSN, AGACNP  Pager [phone] or if no answer [phone] [facility] Pulmonary & Critical Care

## 2019-08-06 NOTE — Progress Notes (Signed)
Subjective: No significant changes  Exam: Vitals:   08/06/19 0729 08/06/19 0731  BP: 132/84 133/84  Pulse:  72  Resp:  18  Temp:  98.3 F (36.8 C)  SpO2:  99%   Gen: In bed, NAD Resp: non-labored breathing, no acute distress Abd: soft, nt  Neuro: MS: Does not open eyes or follow commands CN: Eyes are slightly disconjugate, pupils reactive bilaterally, corneals intact Motor: Minimal extensor posturing bilaterally, what appears to be triple flexion bilateral lower extremities.   Sensory: As above  Pertinent Labs: Cr 7.75  Impression: 48 year old male with anoxic injury status post cardiac arrest.  MRI reveals restricted diffusion throughout the entire cortex.  Based on his MRI with extensive injury, exam with no better than extensor posturing now 3 days after rewarming, and EEG with diffuse suppression and no reactivity, I do not feel that he has any chance of recovery to an independent degree of function.  I have discussed this with his wife who is resistant to this diagnosis.  Recommendations: 1) palliative care consultation  This patient is critically ill and at significant risk of neurological worsening, death and care requires constant monitoring of vital signs, hemodynamics,respiratory and cardiac monitoring, neurological assessment, discussion with family, other specialists and medical decision making of high complexity. I spent 35 minutes of neurocritical care time  in the care of  this patient. This was time spent independent of any time provided by nurse practitioner or PA.  Roland Rack, MD [facility] [phone]  If 7pm- 7am, please page neurology on call as listed in [place]. 08/06/2019  7:20 PM

## 2019-08-06 NOTE — Progress Notes (Addendum)
  Met with pt's spouse, Terrie Grajales and Dr Leonel Ramsay at bedside, and with pt's sister Pamala Hurry, by phone. Dr Leonel Ramsay discussed MRI findings in light of EEG and poor neuro exam and relayed poor prognosis in no uncertain terms. I discussed medical decisions and level of care regarding patient's condition with patient's wife/surrogate as the patient is unable to participate at this time.  These discussions are necessary given the patient's continued deterioration and need for decision regarding continuation of mechanical ventilation, resuscitation and other interventions such as invasive procedures.  Ms Siever states she has faith pt will recover and is not ready to make any decisions regarding [place] moving forward. She states she feels like Mr Sangiovanni would want to fight to live. We offered palliative care as a resource moving forward.  She is not ready for this and states she wants to talk to extended family about [place]. I briefly discussed that inability to wean from ventilator would require trach and feeding tube and long term Mr Soltau will require a facility that can accommodate this as well as dialysis. She states she will follow up with [place] tomorrow.   I spent 25 minutes in direct patient care including reviewing data,  discussing with other providers and d/w family. Time is exclusive to this patient and does not include procedures.

## 2019-08-06 NOTE — Progress Notes (Signed)
[facility] NEPHROLOGY PROGRESS NOTE  Assessment/ Plan: Pt is a 48 y.o. yo male with hypertension, DM, recent ESRD, underwent placement of PD catheter on 8/27, was receiving dialysis TTS at [facility], presented to [facility] ER with SOB and had a cardiac arrest.    # ESRD, recent start: TTS.  He has right IJ TDC. Mild hyperkalemia. Plan for HD today, BP acceptable off pressor.  2K bath, UF goal 2-3 L as tolerated by BP. -Need to flush PD catheter.  # Anemia due to chronic disease:  Received blood transfusion.  Iron saturation only 3%.  Oredred IV iron x 5 days on 8/31. Start Aranesp.  Transfuse as needed.  # Secondary hyperparathyroidism: PTH 93 on 08/03/2019. Changed tube feeding to Nepro.  Phos high, started phoslyra.   # HTN/volume blood pressure soft this morning.  Continue to monitor. Off pressor.  #Cardiac arrest with elevated troponin  #Acute respiratory failure with hypoxia on ventilator: Per pulmonary team.  #Acute encephalopathy/acute anoxic brain injury: Seen by neurology.  Seems poor long-term prognosis.  Discussed with ICU nurse.  Subjective: Seen and examined in ICU.  Sedated and intubated.  Plan for dialysis today.  Review of system is limited. No new event.  Objective Vital signs in last 24 hours: Vitals:   08/06/19 0500 08/06/19 0600 08/06/19 0700 08/06/19 0729  BP: 128/79 125/81 129/83 132/84  Pulse: 77 74 73   Resp: '18 16 18   '$ Temp:      TempSrc:      SpO2: 99% 98% 99%   Weight: 101.4 kg     Height:       Weight change: 1.7 kg  Intake/Output Summary (Last 24 hours) at 08/06/2019 0730 Last data filed at 08/06/2019 0700 Gross per 24 hour  Intake 949.98 ml  Output 102 ml  Net 847.98 ml       Labs: Basic Metabolic Panel: Recent Labs  Lab 08/04/19 0430 08/04/19 1701 08/05/19 0309 08/06/19 0327 08/06/19 0440  NA 137  --  137  --  133*  K 3.6  --  4.7  --  5.6*  CL 99  --  95*  --  94*  CO2 21*  --  24  --  22  GLUCOSE 131*  --  144*  --   214*  BUN 61*  --  43*  --  89*  CREATININE 6.58*  --  5.30*  --  7.75*  CALCIUM 7.0*  --  7.5*  --  7.0*  PHOS 6.1* 4.9* 8.2* 8.0*  --    Liver Function Tests: Recent Labs  Lab 07/31/2019 0400 08/02/19 0436  AST 32 33  ALT 19 22  ALKPHOS 75 66  BILITOT 0.3 0.6  PROT 6.3* 5.7*  ALBUMIN 3.4* 2.9*   No results for input(s): LIPASE, AMYLASE in the last 168 hours. No results for input(s): AMMONIA in the last 168 hours. CBC: Recent Labs  Lab 08/02/19 0436  08/03/19 0437  08/04/19 0430 08/05/19 0309 08/06/19 0327  WBC 10.8*  --  11.9*  --  8.1 9.9 8.8  NEUTROABS  --   --  9.8*  --  6.3 7.8* 7.7  HGB 8.3*   < > 9.2*   < > 7.4* 8.8* 7.5*  HCT 26.1*   < > 31.2*   < > 24.1* 29.5* 25.0*  MCV 91.3  --  96.9  --  92.7 95.2 94.0  PLT 250  --  308  --  284 332 290   < > =  values in this interval not displayed.   Cardiac Enzymes: No results for input(s): CKTOTAL, CKMB, CKMBINDEX, TROPONINI in the last 168 hours. CBG: Recent Labs  Lab 08/05/19 1125 08/05/19 1545 08/05/19 1956 08/05/19 2350 08/06/19 0331  GLUCAP 212* 183* 196* 165* 193*    Iron Studies:  No results for input(s): IRON, TIBC, TRANSFERRIN, FERRITIN in the last 72 hours. Studies/Results: Ct Head Wo Contrast  Result Date: 08/04/2019 CLINICAL DATA:  Cardiac arrest follow-up EXAM: CT HEAD WITHOUT CONTRAST TECHNIQUE: Contiguous axial images were obtained from the base of the skull through the vertex without intravenous contrast. COMPARISON:  CT 07/04/2019 FINDINGS: Brain: Interval development of diffuse brain edema with loss of gray-white differentiation and effacement of sulci. Decreased ventricular size compared to the prior study. Edema seen throughout both cerebral hemispheres and in the cerebellum bilaterally. Partial effacement of basilar cisterns. No herniation. Negative for hemorrhage. Vascular: Negative for hyperdense vessel Skull: Negative Sinuses/Orbits: Air-fluid levels paranasal sinuses.  Negative orbit Other:  None IMPRESSION: Interval development of severe diffuse brain edema compatible with widespread hypoxic injury. Negative for hemorrhage. These results were called by telephone at the time of interpretation on 08/04/2019 at 4:37 pm to Rexene Edison, NP , who verbally acknowledged these results. Electronically Signed   By: Franchot Gallo M.D.   On: 08/04/2019 16:46   Dg Chest Port 1 View  Result Date: 08/05/2019 CLINICAL DATA:  Acute respiratory failure. End-stage renal disease. EXAM: PORTABLE CHEST 1 VIEW COMPARISON:  08/04/2019 FINDINGS: Stable enlarged cardiac silhouette. Endotracheal tube in satisfactory position. Right jugular catheter tip in the right atrium. A poor inspiration is again demonstrated with increased patchy opacity at the right lung base and decreased patchy and linear density at the left lung base. Mild-to-moderate diffuse peribronchial thickening is again demonstrated with decreased prominence of the interstitial markings. No visible pleural fluid. Unremarkable bones. IMPRESSION: 1. Bibasilar atelectasis, greater on the right and decreased on the left. Pneumonia can not be excluded at the right lung base. 2. Stable cardiomegaly. Electronically Signed   By: Claudie Revering M.D.   On: 08/05/2019 08:15    Medications: Infusions: . sodium chloride Stopped (07/15/2019 1242)  . sodium chloride 10 mL/hr at 08/06/19 0700  . sodium chloride    . sodium chloride    . sodium chloride    . sodium chloride    . famotidine (PEPCID) IV Stopped (08/05/19 0920)  . fentaNYL infusion INTRAVENOUS 200 mcg/hr (08/06/19 0700)  . norepinephrine (LEVOPHED) Adult infusion Stopped (08/05/19 1025)    Scheduled Medications: . acetaminophen (TYLENOL) oral liquid 160 mg/5 mL  650 mg Per Tube Q6H  . artificial tears  1 application Both Eyes E5I  . calcium acetate (Phos Binder)  667 mg Per Tube TID WC  . chlorhexidine gluconate (MEDLINE KIT)  15 mL Mouth Rinse BID  . Chlorhexidine Gluconate Cloth  6 each Topical  Daily  . Chlorhexidine Gluconate Cloth  6 each Topical Q0600  . darbepoetin (ARANESP) injection - DIALYSIS  60 mcg Intravenous Q Tue-HD  . feeding supplement (NEPRO CARB STEADY)  1,000 mL Per Tube Q24H  . feeding supplement (PRO-STAT SUGAR FREE 64)  60 mL Per Tube TID  . heparin      . heparin  3.2 mL Intravenous Once  . heparin  5,000 Units Subcutaneous Q8H  . insulin aspart  0-15 Units Subcutaneous Q4H  . mouth rinse  15 mL Mouth Rinse 10 times per day  . methylPREDNISolone (SOLU-MEDROL) injection  40 mg Intravenous Q8H  .  sodium chloride flush  10-40 mL Intracatheter Q12H  . sodium chloride flush  3 mL Intravenous Q12H    have reviewed scheduled and prn medications.  Physical Exam: Unchanged General: Sedated, intubated, not responding Heart:RRR, s1s2 nl, no rubs Lungs: Coarse breath sound bilateral, no wheezing Abdomen:soft, mild distention, PD catheter site looks clean Extremities: Trace lower extremity edema Dialysis Access: Right IJ tunnel catheter, has a PD catheter site clean.  Castulo Scarpelli Prasad Krithi Bray 08/06/2019,7:30 AM  LOS: 5 days  Pager: [phone]

## 2019-08-06 NOTE — Progress Notes (Signed)
Pt transported to and from MRI scan without incident.

## 2019-08-06 NOTE — Progress Notes (Signed)
OK to place flexiseal for liquid diarrhea per Suanne Marker, CCM NP.

## 2019-08-06 NOTE — Progress Notes (Signed)
Referred by nurse to speak with [name]' spouse, Larry Banks.  She is concerned about making any decisions too soon because [name] may eventually get better.  She believes that a "miracle" can take place.  She wants to speak with family and pray about what steps to take next.  Larry Banks believes that [name] would want her to continue to "fight" on his behalf.    I will continue to follow-up with Larry Banks throughout [name]' stay.

## 2019-08-07 ENCOUNTER — Inpatient Hospital Stay (HOSPITAL_COMMUNITY): Payer: 59

## 2019-08-07 DIAGNOSIS — Z992 Dependence on renal dialysis: Secondary | ICD-10-CM

## 2019-08-07 DIAGNOSIS — R739 Hyperglycemia, unspecified: Secondary | ICD-10-CM

## 2019-08-07 LAB — CBC
HCT: 27 % — ABNORMAL LOW (ref 39.0–52.0)
Hemoglobin: 8.2 g/dL — ABNORMAL LOW (ref 13.0–17.0)
MCH: 28.3 pg (ref 26.0–34.0)
MCHC: 30.4 g/dL (ref 30.0–36.0)
MCV: 93.1 fL (ref 80.0–100.0)
Platelets: 330 10*3/uL (ref 150–400)
RBC: 2.9 MIL/uL — ABNORMAL LOW (ref 4.22–5.81)
RDW: 14.3 % (ref 11.5–15.5)
WBC: 12.9 10*3/uL — ABNORMAL HIGH (ref 4.0–10.5)
nRBC: 0.4 % — ABNORMAL HIGH (ref 0.0–0.2)

## 2019-08-07 LAB — RENAL FUNCTION PANEL
Albumin: 2.9 g/dL — ABNORMAL LOW (ref 3.5–5.0)
Anion gap: 16 — ABNORMAL HIGH (ref 5–15)
BUN: 82 mg/dL — ABNORMAL HIGH (ref 6–20)
CO2: 25 mmol/L (ref 22–32)
Calcium: 7.6 mg/dL — ABNORMAL LOW (ref 8.9–10.3)
Chloride: 96 mmol/L — ABNORMAL LOW (ref 98–111)
Creatinine, Ser: 6.43 mg/dL — ABNORMAL HIGH (ref 0.61–1.24)
GFR calc Af Amer: 11 mL/min — ABNORMAL LOW (ref >60)
GFR calc non Af Amer: 9 mL/min — ABNORMAL LOW (ref >60)
Glucose, Bld: 212 mg/dL — ABNORMAL HIGH (ref 70–99)
Phosphorus: 7.9 mg/dL — ABNORMAL HIGH (ref 2.5–4.6)
Potassium: 4.8 mmol/L (ref 3.5–5.1)
Sodium: 137 mmol/L (ref 135–145)

## 2019-08-07 LAB — GLUCOSE, CAPILLARY
Glucose-Capillary: 142 mg/dL — ABNORMAL HIGH (ref 70–99)
Glucose-Capillary: 194 mg/dL — ABNORMAL HIGH (ref 70–99)
Glucose-Capillary: 199 mg/dL — ABNORMAL HIGH (ref 70–99)
Glucose-Capillary: 199 mg/dL — ABNORMAL HIGH (ref 70–99)
Glucose-Capillary: 225 mg/dL — ABNORMAL HIGH (ref 70–99)
Glucose-Capillary: 271 mg/dL — ABNORMAL HIGH (ref 70–99)

## 2019-08-07 MED ORDER — CALCIUM ACETATE (PHOS BINDER) 667 MG/5ML PO SOLN
1334.0000 mg | Freq: Three times a day (TID) | ORAL | Status: DC
  Administered 2019-08-07 – 2019-08-25 (×46): 1334 mg
  Filled 2019-08-07 (×59): qty 10

## 2019-08-07 MED ORDER — INSULIN ASPART 100 UNIT/ML ~~LOC~~ SOLN
0.0000 [IU] | SUBCUTANEOUS | Status: DC
  Administered 2019-08-07: 20:00:00 3 [IU] via SUBCUTANEOUS
  Administered 2019-08-07 – 2019-08-08 (×2): 4 [IU] via SUBCUTANEOUS
  Administered 2019-08-08 (×4): 7 [IU] via SUBCUTANEOUS
  Administered 2019-08-08: 13:00:00 3 [IU] via SUBCUTANEOUS
  Administered 2019-08-09 (×2): 4 [IU] via SUBCUTANEOUS
  Administered 2019-08-09: 08:00:00 3 [IU] via SUBCUTANEOUS
  Administered 2019-08-09 (×3): 4 [IU] via SUBCUTANEOUS
  Administered 2019-08-10: 20:00:00 7 [IU] via SUBCUTANEOUS
  Administered 2019-08-10: 13:00:00 4 [IU] via SUBCUTANEOUS
  Administered 2019-08-10: 01:00:00 7 [IU] via SUBCUTANEOUS
  Administered 2019-08-10 – 2019-08-11 (×4): 4 [IU] via SUBCUTANEOUS
  Administered 2019-08-11: 7 [IU] via SUBCUTANEOUS
  Administered 2019-08-11 (×3): 4 [IU] via SUBCUTANEOUS
  Administered 2019-08-11: 7 [IU] via SUBCUTANEOUS
  Administered 2019-08-12 (×2): 4 [IU] via SUBCUTANEOUS
  Administered 2019-08-12: 7 [IU] via SUBCUTANEOUS
  Administered 2019-08-12: 3 [IU] via SUBCUTANEOUS
  Administered 2019-08-12: 7 [IU] via SUBCUTANEOUS
  Administered 2019-08-12: 20:00:00 4 [IU] via SUBCUTANEOUS
  Administered 2019-08-13: 12:00:00 11 [IU] via SUBCUTANEOUS
  Administered 2019-08-13: 17:00:00 7 [IU] via SUBCUTANEOUS
  Administered 2019-08-13 (×2): 3 [IU] via SUBCUTANEOUS
  Administered 2019-08-13: 11 [IU] via SUBCUTANEOUS
  Administered 2019-08-14: 20:00:00 4 [IU] via SUBCUTANEOUS
  Administered 2019-08-14: 3 [IU] via SUBCUTANEOUS
  Administered 2019-08-14 (×4): 4 [IU] via SUBCUTANEOUS
  Administered 2019-08-15: 7 [IU] via SUBCUTANEOUS
  Administered 2019-08-15: 15 [IU] via SUBCUTANEOUS
  Administered 2019-08-15 (×3): 4 [IU] via SUBCUTANEOUS
  Administered 2019-08-16: 12:00:00 7 [IU] via SUBCUTANEOUS
  Administered 2019-08-16 – 2019-08-17 (×6): 4 [IU] via SUBCUTANEOUS
  Administered 2019-08-17: 3 [IU] via SUBCUTANEOUS
  Administered 2019-08-17: 4 [IU] via SUBCUTANEOUS
  Administered 2019-08-17: 7 [IU] via SUBCUTANEOUS
  Administered 2019-08-17: 3 [IU] via SUBCUTANEOUS
  Administered 2019-08-18: 4 [IU] via SUBCUTANEOUS
  Administered 2019-08-18: 15 [IU] via SUBCUTANEOUS
  Administered 2019-08-18 (×2): 4 [IU] via SUBCUTANEOUS
  Administered 2019-08-18: 12:00:00 7 [IU] via SUBCUTANEOUS
  Administered 2019-08-19 (×6): 4 [IU] via SUBCUTANEOUS
  Administered 2019-08-20: 3 [IU] via SUBCUTANEOUS
  Administered 2019-08-20: 4 [IU] via SUBCUTANEOUS
  Administered 2019-08-20 – 2019-08-23 (×3): 3 [IU] via SUBCUTANEOUS

## 2019-08-07 MED ORDER — CHLORHEXIDINE GLUCONATE CLOTH 2 % EX PADS
6.0000 | MEDICATED_PAD | Freq: Every day | CUTANEOUS | Status: DC
  Administered 2019-08-08 – 2019-08-09 (×2): 6 via TOPICAL

## 2019-08-07 NOTE — Progress Notes (Signed)
Followed up with Mr. Trabert' wife.  She expressed that she had talked with her family and they agreed to pursue long-term care for Mr. Bas.  She stated that she would talk to palliative care, and asked the nurse about a pal. consult.   Will continue to follow-up throughout Mr. Nong' stay.

## 2019-08-07 NOTE — Progress Notes (Signed)
While giving meds per OG tube, patient looked like he was coughing. I measured OG and patient had positive gastric swoosh. I connected OG to suction, and immediately meds came out of OG.  There were white chunks in OG, but it didn't appear to be all TF.  I gave the patient zofran and versed.   CCM aware of situation.  OK to connect back to trickle TFs. Thought to maybe have been a stimulation response.

## 2019-08-07 NOTE — Progress Notes (Signed)
Subjective: Eyes are opening to noxious stimulation.   Exam: Vitals:   08/07/19 0738 08/07/19 0800  BP:  134/88  Pulse:  87  Resp:  16  Temp: 99 F (37.2 C)   SpO2:  99%   Gen: In bed, NAD Resp: non-labored breathing, no acute distress Abd: soft, nt  Neuro: MS: Eyes open to noxious stimulation, does not interact.  CN: Eyes are slightly disconjugate, pupils reactive bilaterally, corneals intact Motor: Minimal extensor posturing bilaterally, what appears to be triple flexion bilateral lower extremities.   Sensory: As above  Pertinent Labs: Cr 7.75  Impression: 48 year old male with anoxic injury status post cardiac arrest.  MRI reveals restricted diffusion throughout the entire cortex.  Based on his MRI with extensive injury, exam with no better than extensor posturing now 3 days after rewarming, and EEG with diffuse suppression and no reactivity, I do not feel that he has any chance of recovery to an independent degree of function. He could improve to a vegetative state, but I do not anticipate recovery better than this. I have discussed this with his wife who is resistant to this diagnosis.  Recommendations: 1) palliative care consultation  Roland Rack, MD [facility] [phone]  If 7pm- 7am, please page neurology on call as listed in [place]. 08/07/2019  9:08 AM

## 2019-08-07 NOTE — Progress Notes (Signed)
PULMONARY / CRITICAL CARE MEDICINE   NAME:  Larry Banks, MRN:  [id number], DOB:  1971/05/24, LOS: 6 ADMISSION DATE:  07/30/2019, CONSULTATION DATE:  07/27/2019  REFERRING MD:  EDP Dr. Tomi Banks  , CHIEF COMPLAINT:  Cardiac Arrest/Resp Failure   BRIEF HISTORY:    48 year old male with medical history significant for hypertension, PAD, hyperlipidemia, CKD Stage 5 developed shortness of breath at home.  Patient does have CKD Stage 5 .  Is followed at [facility].  Recently had peritoneal dialysis catheter placed on August 27. Has not started Dialysis yet.  On arrival to the emergency room patient suffered a cardiac arrest with asystole.  He received CPR x15 minutes.  He receieved hyperkalemic protocol due to his underlying chronic kidney disease with 1 amp bicarb, 1 amp D50, insulin 10 units regular IV.  He received epi x3.  He had ROSC after 15 minutes.  He did require very brief Neo-Synephrine for hypotension.  He had significant agitation was started on sedation with Versed and Fentanyl.    Patient was transported to [facility] ICU for [name] to admit.  Remains intubated without significant neurologic recovery. EEG and CT suggestive of severe anoxic brain injury  SIGNIFICANT PAST MEDICAL HISTORY   Hypertension, hyperlipidemia, PAD, chronic kidney disease stage V  SIGNIFICANT EVENTS:  8/26-  TTS HD - last 07/29/2019 Larry Banks (old tunneled Rt HD cah) 8/27 - PD cath - for a planned change from iHD to PD 8/29- Cardiac arrest, CPR x15 minutes, epi x3. Per renal CXR- c/w pulmonary edema 8/31- 1 unit PRBC, paralyzed due to vent dysynchrony 9/1- off pressors  STUDIES:   8/29 CT head  > no acute abnormality 8/29 CT abdomen pelvis > bilateral lower lobe consolidation with small pleural effusion.  Groundglass opacities lower portion of the lung.  Nonobstructive renal calculus, 8/29 Echo > ef 45-50%, hypokineses of the mid-inferior and lateral wall 8/29 - EEG> diffuse slowing and generalized  suppression suggestive of profound encephalopathy, no seizure activity 9/1 CT head>severe diffuse brain edema compatible with widespread hypoxic injury 9/2 EEG> suggestive ofprofound diffuse encephalopathy,could besecondary to anoxic/hypoxic brain injury.No seizures or epileptiform discharges were seen throughout the recording.  9/3 MRI brain>Diffuse hypoxic ischemic injury most pronounced affecting the cerebral hemispheric cortex but with some involvement also of the basal ganglia and thalami. 9/4: CXR personally reviewed> Mild vascular congestion. Slight improved aeration in the bases bilaterally despite a poor inspiratory effort.  CULTURES:  8/29 BC >>Negative  8/29 Sputum >> Rare GNR, GPC>> Normal Flora 8/29 COVID 19 >> NEG  MRSA surveillance negative   ANTIBIOTICS:  Unasyn 8/29 >9/1  LINES/TUBES:  Peritoneal cath 8/27 (PTA )  R HD cath ? >>  ------- 8/29 ETT >>  8/29 L IJ CVL> order to discontinue on 9/4 8/29 A line>>8/30  CONSULTANTS:  Renal  Neurology  SUBJECTIVE:  Remains off pressors.  Intermittent vent dyssynchrony associated with brief periods of hypoxia and increased secretions requiring sedation and analgesia  CONSTITUTIONAL: BP 134/88 (BP Location: Left Arm)   Pulse 87   Temp 99 F (37.2 C) (Oral)   Resp 16   Ht 5\' 6"  (1.676 m)   Wt 96.8 kg   SpO2 99%   BMI 34.44 kg/m   I/O last 3 completed shifts: In: 1072.7 [I.V.:482.7; NG/GT:540; IV Piggyback:50] Out: 2950 [Urine:450; Other:2500]     Vent Mode: PRVC;SIMV;PSV FiO2 (%):  [40 %] 40 % Set Rate:  [10 bmp] 10 bmp Vt Set:  [500 mL] 500 mL  PEEP:  [5 cmH20] 5 cmH20 Pressure Support:  [10 cmH20] 10 cmH20 Plateau Pressure:  [13 cmH20-16 cmH20] 13 cmH20  General: Critically ill-appearing, well-developed, well nourished.  HENT: Normocephalic, PERRL. Moist mucus membranes.  Continued large tongue swelling with protrusion. Neck: No JVD. Trachea midline.  CV: RRR. S1S2. No MRG. +2 distal pulses Lungs:  BBS present, diminished bases, FNL, symmetrical. ABD: Rounded.  Hypoactive BS x4. SNT/ND. No masses, guarding or rigidity.  Right abdominal peritoneal dialysis catheter GU: Condom cath EXT: Anasarca-proved Skin: PWD.  Anterior surfaces in tact. No rashes or lesions Neuro: Extensor posturing lower extremities only to deep noxious stimuli.  Does not withdraw in upper extremities.  Eyes open spontaneously.  Positive corneal reflex.  No gag reflex this morning    RESOLVED PROBLEM LIST  NA ASSESSMENT AND PLAN   PEA arrest with acute hypoxic respiratory failure and anoxic brain injury -s/p CPR and ROSC, trop elevated but initial EKG did not indicate STEMI and patient was not taken to the cath lab -Transitioned from HD to PD and had missed dialysis the day of admission, presenting K was ok --s/p 36 degree TTM, remains off pressors  -EEG without seizure activity but profound diffuse encephalopathy. -Echo will moderate hypokinesis of the mid-inferior and lateral wall, new since echo in 2019 On intermittent midazolam and fentanyl for vent synchrony which causes desaturation -MRI with restricted diffusion throughout the entire cortex. P: -Neuro following-poor prognosis given to wife 9/3 of any chance of recovery to an independent degree of function.  She is resistant to this prognosis -Will consult palliative care when family agreeable to same-was not ready as of yesterday -Continue ventilator support to prevent eminent deterioration and further organ dysfunction from hypoxemia and hypercarbia.   -Patient is at risk for sudden hypoxia, barotrauma and hemodynamic compromise.   -Maintain SpO2 greater than or equal to 90%. -Head of bed elevated 30 degrees. -Plateau pressures less than 30 cm H20.  -Follow chest x-ray, ABG prn  -SAT/SBT as tolerated. -Bronchial hygiene. -RT/bronchodilator protocol. -Neuroprotective measures: Maintain euthermia, euglycemia, eunatremia, normoxia, pCO2 35-40, nutrition  and bowel regimen, seizure precautions, head of bed elevated     Possible PNA vs edema -CXR reviewed by me, slightly improved aeration -leukocytosis resolved, Respiratory culture nl flora P: -Continue to observe off antibiotics -Volume removal per HD    ESRD stage 5 -receiving HD at [facility], TTS -Peritoneal Cath placed on 8/27 to replace old R IJ P: -Continue HD per nephrology recommendations -monitor renal panel   Angioedema -etiology unclear, no signs of systemic edema, rash or allergic reaction, no ACEI, on ARB at home -Essentially unchanged with no continued systemic signs P: -steroids and diphenhydramine complete -continue to monitor  Anemia of chronic disease -s/p 1 unit PRBCs 8/29 -iron saturation 3% P: -Follow hemoglobin -Renal Ordered IV iron x 5 days starting  8/31.  -Continue Aranesp -Transfuse for HGB < 7   Type 2 DM P: -Cont SSI  Secondary Hyperparathyroidism PTH 93 on 8/28 P: -Nepro per Dietician  -Trend PTH  Diarrhea P FMS -monitor I/O   Best Practice / Goals of Care / Disposition.   Diet: Tube feeds per recs Pain/Anxiety/Delirium protocol (if indicated): fentanyl, midazolam prn VAP protocol (if indicated): ordered DVT prophylaxis: heparin SQ GI prophylaxis: famotidine Glucose control: SSI Mobility: Bed Code Status: Full Family Communication: Will update today  Disposition: ICU  ATTESTATION & SIGNATURE     CRITICAL CARE  The patient is critically ill with respiratory failure and anoxic brain  injury. He requires ongoing ICU for high complexity decision making, titration of high alert medications, ventilator management, titration of oxygen and interpretation of advanced monitoring.    I personally spent 35 minutes providing critical care services including personally reviewing test results, discussing care with nursing staff/other physicians and completing orders pertaining to this patient.  Time was exclusive to the patient  and does not include time spent teaching or in procedures.  Voice recognition software was used in the production of this record.  Errors in interpretation may have been inadvertently missed during review.  Francine Graven, MSN, AGACNP  Pager [phone] or if no answer [phone] [facility]monary [facility]

## 2019-08-07 NOTE — Progress Notes (Signed)
[facility] NEPHROLOGY PROGRESS NOTE  Assessment/ Plan: Pt is a 48 y.o. yo male with hypertension, DM, recent ESRD, underwent placement of PD catheter on 8/27, was receiving dialysis TTS at [facility], presented to [facility] ER with SOB and had a cardiac arrest.    # ESRD, recent start: TTS.  He has right IJ TDC. Mild hyperkalemia.  Status post dialysis yesterday with 2.5 L UF, urine output 450 cc.  Plan for next HD tomorrow, BP acceptable off pressor.   # Anemia due to chronic disease:  Received blood transfusion.  Iron saturation only 3%.  Oredred IV iron however it was discontinued because of concern of reaction.  Continue Aranesp.  Transfuse as needed.  # Secondary hyperparathyroidism: PTH 93 on 07/08/2019. Changed tube feeding to Nepro.  Phos high, increase the dose of phoslyra.   # HTN/volume blood pressure soft this morning.  Continue to monitor. Off pressor.  #Cardiac arrest with elevated troponin  #Acute respiratory failure with hypoxia on ventilator: Per pulmonary team.  #Acute encephalopathy/acute hypoxic brain injury: Seen by neurology.  Seems poor long-term prognosis.  Discussed with ICU nurse.  Subjective: Seen and examined in ICU.  Sedated and intubated.  No new event.  Tolerated dialysis well yesterday. Objective Vital signs in last 24 hours: Vitals:   08/07/19 0600 08/07/19 0700 08/07/19 0715 08/07/19 0738  BP: (!) 146/95 132/87 132/87   Pulse: (!) 106 89    Resp: 12 10    Temp:    99 F (37.2 C)  TempSrc:    Oral  SpO2: 100% 99% 99%   Weight:      Height:       Weight change: -4.6 kg  Intake/Output Summary (Last 24 hours) at 08/07/2019 0758 Last data filed at 08/07/2019 0400 Gross per 24 hour  Intake 411.41 ml  Output 2950 ml  Net -2538.59 ml       Labs: Basic Metabolic Panel: Recent Labs  Lab 08/05/19 0309 08/06/19 0327 08/06/19 0440 08/07/19 0324  NA 137  --  133* 137  K 4.7  --  5.6* 4.8  CL 95*  --  94* 96*  CO2 24  --  22 25   GLUCOSE 144*  --  214* 212*  BUN 43*  --  89* 82*  CREATININE 5.30*  --  7.75* 6.43*  CALCIUM 7.5*  --  7.0* 7.6*  PHOS 8.2* 8.0*  --  7.9*   Liver Function Tests: Recent Labs  Lab 07/04/2019 0400 08/02/19 0436 08/07/19 0324  AST 32 33  --   ALT 19 22  --   ALKPHOS 75 66  --   BILITOT 0.3 0.6  --   PROT 6.3* 5.7*  --   ALBUMIN 3.4* 2.9* 2.9*   No results for input(s): LIPASE, AMYLASE in the last 168 hours. No results for input(s): AMMONIA in the last 168 hours. CBC: Recent Labs  Lab 08/02/19 0436  08/03/19 0437  08/04/19 0430 08/05/19 0309 08/06/19 0327  WBC 10.8*  --  11.9*  --  8.1 9.9 8.8  NEUTROABS  --   --  9.8*  --  6.3 7.8* 7.7  HGB 8.3*   < > 9.2*   < > 7.4* 8.8* 7.5*  HCT 26.1*   < > 31.2*   < > 24.1* 29.5* 25.0*  MCV 91.3  --  96.9  --  92.7 95.2 94.0  PLT 250  --  308  --  284 332 290   < > =  values in this interval not displayed.   Cardiac Enzymes: No results for input(s): CKTOTAL, CKMB, CKMBINDEX, TROPONINI in the last 168 hours. CBG: Recent Labs  Lab 08/06/19 1603 08/06/19 1945 08/07/19 0023 08/07/19 0323 08/07/19 0740  GLUCAP 194* 228* 225* 199* 194*    Iron Studies:  No results for input(s): IRON, TIBC, TRANSFERRIN, FERRITIN in the last 72 hours. Studies/Results: Mr Brain Wo Contrast  Result Date: 08/06/2019 CLINICAL DATA:  Follow-up cardiac arrest. EXAM: MRI HEAD WITHOUT CONTRAST TECHNIQUE: Multiplanar, multiecho pulse sequences of the brain and surrounding structures were obtained without intravenous contrast. COMPARISON:  Head CT 08/04/2019 FINDINGS: Brain: Today's study shows widespread hypoxic ischemic injury to the cerebral hemispheric cortex, basal ganglia and thalami. No evidence of mass effect or herniation. No hemorrhage. No hydrocephalus or extra-axial collection. Vascular: Major vessels at the base of the brain show flow. Skull and upper cervical spine: Negative Sinuses/Orbits: Mucosal thickening and fluid within the paranasal sinuses,  particularly the sphenoid sinus. Orbits negative. Bilateral mastoid effusions. Other: None IMPRESSION: Diffuse hypoxic ischemic injury most pronounced affecting the cerebral hemispheric cortex but with some involvement also of the basal ganglia and thalami. Electronically Signed   By: [name] Chimes M.D.   On: 08/06/2019 13:55   Dg Chest Port 1 View  Result Date: 08/07/2019 CLINICAL DATA:  Acute respiratory failure EXAM: PORTABLE CHEST 1 VIEW COMPARISON:  08/06/2019 FINDINGS: Cardiac shadow is stable. Endotracheal tube and gastric catheter are again seen and stable. Left subclavian line and right jugular dialysis catheter are again noted and stable. The overall inspiratory effort is again poor although improved aeration in the bases is seen. Mild central vascular congestion is noted. IMPRESSION: Mild vascular congestion. Slight improved aeration in the bases bilaterally despite a poor inspiratory effort. Tubes and lines as described. Electronically Signed   By: Inez Catalina M.D.   On: 08/07/2019 06:50   Dg Chest Port 1 View  Result Date: 08/06/2019 CLINICAL DATA:  48 year old male with respiratory failure. EXAM: PORTABLE CHEST 1 VIEW COMPARISON:  Chest radiograph dated 08/05/2019 FINDINGS: Support lines and tubes in similar position. There is shallow inspiration with bibasilar atelectasis. Small bilateral pleural effusions may be present. No pneumothorax. Stable cardiomegaly. Atherosclerotic calcification of the aorta. No acute osseous pathology. IMPRESSION: No significant interval change. Electronically Signed   By: Anner Crete M.D.   On: 08/06/2019 08:31    Medications: Infusions: . sodium chloride Stopped (07/09/2019 1242)  . sodium chloride 10 mL/hr at 08/06/19 1100  . sodium chloride    . sodium chloride    . sodium chloride    . sodium chloride    . norepinephrine (LEVOPHED) Adult infusion Stopped (08/05/19 9794)    Scheduled Medications: . acetaminophen (TYLENOL) oral liquid 160 mg/5 mL   650 mg Per Tube Q6H  . calcium acetate (Phos Binder)  667 mg Per Tube TID WC  . chlorhexidine gluconate (MEDLINE KIT)  15 mL Mouth Rinse BID  . Chlorhexidine Gluconate Cloth  6 each Topical Daily  . Chlorhexidine Gluconate Cloth  6 each Topical Q0600  . darbepoetin (ARANESP) injection - DIALYSIS  60 mcg Intravenous Q Tue-HD  . famotidine  10 mg Per Tube BID  . feeding supplement (NEPRO CARB STEADY)  1,000 mL Per Tube Q24H  . feeding supplement (PRO-STAT SUGAR FREE 64)  60 mL Per Tube TID  . heparin  5,000 Units Subcutaneous Q8H  . insulin aspart  0-15 Units Subcutaneous Q4H  . mouth rinse  15 mL Mouth Rinse 10 times  per day  . sodium chloride flush  10-40 mL Intracatheter Q12H  . sodium chloride flush  3 mL Intravenous Q12H    have reviewed scheduled and prn medications.  Physical Exam: Unchanged General: Sedated, intubated, not responding Heart:RRR, s1s2 nl, no rubs Lungs: Coarse breath sound bilateral, no wheezing Abdomen:soft, mild distention, PD catheter site looks clean Extremities: No edema  Dialysis Access: Right IJ tunnel catheter, has a PD catheter site clean.  Marcine Gadway Prasad Hang Ammon 08/07/2019,7:58 AM  LOS: 6 days  Pager: [phone]

## 2019-08-07 NOTE — Plan of Care (Signed)
Updated Larry Banks' wife Larry Banks at bedside and his sister Larry Banks over the phone about his ongoing care. She understands the prognosis but feels strongly that he would want a prolonged chance at recovery, even if it is small. This would be a time-limited trial as she would not want to have him remain vent-dependent permanently. She wishes to pursue trach & PEG and continuing all aggressive care measures. They are planning to discuss code status, but are unwilling to change it without speaking with more family members first.   Additional 20 minutes CC time in discussion with his family and coordination of care.  [name], DO 08/07/19 2:10 PM [name]

## 2019-08-08 ENCOUNTER — Inpatient Hospital Stay (HOSPITAL_COMMUNITY): Payer: 59

## 2019-08-08 DIAGNOSIS — R5081 Fever presenting with conditions classified elsewhere: Secondary | ICD-10-CM

## 2019-08-08 LAB — GLUCOSE, CAPILLARY
Glucose-Capillary: 144 mg/dL — ABNORMAL HIGH (ref 70–99)
Glucose-Capillary: 188 mg/dL — ABNORMAL HIGH (ref 70–99)
Glucose-Capillary: 195 mg/dL — ABNORMAL HIGH (ref 70–99)
Glucose-Capillary: 205 mg/dL — ABNORMAL HIGH (ref 70–99)
Glucose-Capillary: 210 mg/dL — ABNORMAL HIGH (ref 70–99)
Glucose-Capillary: 215 mg/dL — ABNORMAL HIGH (ref 70–99)
Glucose-Capillary: 223 mg/dL — ABNORMAL HIGH (ref 70–99)

## 2019-08-08 LAB — RENAL FUNCTION PANEL
Albumin: 2.9 g/dL — ABNORMAL LOW (ref 3.5–5.0)
Anion gap: 20 — ABNORMAL HIGH (ref 5–15)
BUN: 126 mg/dL — ABNORMAL HIGH (ref 6–20)
CO2: 23 mmol/L (ref 22–32)
Calcium: 7.6 mg/dL — ABNORMAL LOW (ref 8.9–10.3)
Chloride: 96 mmol/L — ABNORMAL LOW (ref 98–111)
Creatinine, Ser: 8.14 mg/dL — ABNORMAL HIGH (ref 0.61–1.24)
GFR calc Af Amer: 8 mL/min — ABNORMAL LOW (ref >60)
GFR calc non Af Amer: 7 mL/min — ABNORMAL LOW (ref >60)
Glucose, Bld: 201 mg/dL — ABNORMAL HIGH (ref 70–99)
Phosphorus: 6 mg/dL — ABNORMAL HIGH (ref 2.5–4.6)
Potassium: 4.1 mmol/L (ref 3.5–5.1)
Sodium: 139 mmol/L (ref 135–145)

## 2019-08-08 LAB — URINALYSIS, ROUTINE W REFLEX MICROSCOPIC
Bilirubin Urine: NEGATIVE
Glucose, UA: NEGATIVE mg/dL
Ketones, ur: NEGATIVE mg/dL
Leukocytes,Ua: NEGATIVE
Nitrite: NEGATIVE
Protein, ur: 100 mg/dL — AB
Specific Gravity, Urine: 1.014 (ref 1.005–1.030)
pH: 5 (ref 5.0–8.0)

## 2019-08-08 LAB — CBC
HCT: 28.3 % — ABNORMAL LOW (ref 39.0–52.0)
Hemoglobin: 8.7 g/dL — ABNORMAL LOW (ref 13.0–17.0)
MCH: 28.4 pg (ref 26.0–34.0)
MCHC: 30.7 g/dL (ref 30.0–36.0)
MCV: 92.5 fL (ref 80.0–100.0)
Platelets: 372 10*3/uL (ref 150–400)
RBC: 3.06 MIL/uL — ABNORMAL LOW (ref 4.22–5.81)
RDW: 14.6 % (ref 11.5–15.5)
WBC: 15.9 10*3/uL — ABNORMAL HIGH (ref 4.0–10.5)
nRBC: 0.3 % — ABNORMAL HIGH (ref 0.0–0.2)

## 2019-08-08 LAB — TRIGLYCERIDES: Triglycerides: 428 mg/dL — ABNORMAL HIGH (ref <150)

## 2019-08-08 MED ORDER — INSULIN GLARGINE 100 UNIT/ML ~~LOC~~ SOLN
20.0000 [IU] | Freq: Every day | SUBCUTANEOUS | Status: DC
  Administered 2019-08-08 – 2019-08-10 (×3): 20 [IU] via SUBCUTANEOUS
  Filled 2019-08-08 (×3): qty 0.2

## 2019-08-08 MED ORDER — DEXMEDETOMIDINE HCL IN NACL 200 MCG/50ML IV SOLN
0.4000 ug/kg/h | INTRAVENOUS | Status: DC
  Administered 2019-08-08 (×2): 0.7 ug/kg/h via INTRAVENOUS
  Administered 2019-08-08: 16:00:00 0.4 ug/kg/h via INTRAVENOUS
  Administered 2019-08-09: 23:00:00 0.8 ug/kg/h via INTRAVENOUS
  Administered 2019-08-09 (×2): 0.6 ug/kg/h via INTRAVENOUS
  Administered 2019-08-09: 0.5 ug/kg/h via INTRAVENOUS
  Administered 2019-08-09: 0.8 ug/kg/h via INTRAVENOUS
  Administered 2019-08-09: 04:00:00 0.7 ug/kg/h via INTRAVENOUS
  Administered 2019-08-09: 20:00:00 0.6 ug/kg/h via INTRAVENOUS
  Administered 2019-08-10: 06:00:00 0.5 ug/kg/h via INTRAVENOUS
  Administered 2019-08-10: 0.8 ug/kg/h via INTRAVENOUS
  Administered 2019-08-10: 0.6 ug/kg/h via INTRAVENOUS
  Administered 2019-08-10: 1 ug/kg/h via INTRAVENOUS
  Administered 2019-08-10: 01:00:00 0.9 ug/kg/h via INTRAVENOUS
  Administered 2019-08-10: 04:00:00 0.8 ug/kg/h via INTRAVENOUS
  Administered 2019-08-10: 0.7 ug/kg/h via INTRAVENOUS
  Administered 2019-08-10: 0.6 ug/kg/h via INTRAVENOUS
  Administered 2019-08-11: 02:00:00 0.9 ug/kg/h via INTRAVENOUS
  Administered 2019-08-11: 05:00:00 0.8 ug/kg/h via INTRAVENOUS
  Filled 2019-08-08 (×20): qty 50

## 2019-08-08 MED ORDER — VANCOMYCIN HCL 10 G IV SOLR
2000.0000 mg | Freq: Once | INTRAVENOUS | Status: AC
  Administered 2019-08-08: 2000 mg via INTRAVENOUS
  Filled 2019-08-08: qty 2000

## 2019-08-08 MED ORDER — VANCOMYCIN HCL IN DEXTROSE 1-5 GM/200ML-% IV SOLN
1000.0000 mg | INTRAVENOUS | Status: DC
  Administered 2019-08-11: 10:00:00 1000 mg via INTRAVENOUS
  Filled 2019-08-08: qty 200

## 2019-08-08 MED ORDER — PANCRELIPASE (LIP-PROT-AMYL) 10440-39150 UNITS PO TABS
20880.0000 [IU] | ORAL_TABLET | Freq: Once | ORAL | Status: AC
  Administered 2019-08-09: 01:00:00 20880 [IU]
  Filled 2019-08-08: qty 2

## 2019-08-08 MED ORDER — SODIUM BICARBONATE 650 MG PO TABS
650.0000 mg | ORAL_TABLET | Freq: Once | ORAL | Status: AC
  Administered 2019-08-09: 01:00:00 650 mg
  Filled 2019-08-08: qty 1

## 2019-08-08 MED ORDER — SODIUM CHLORIDE 0.9 % IV SOLN
2.0000 g | INTRAVENOUS | Status: DC
  Administered 2019-08-08 – 2019-08-11 (×2): 2 g via INTRAVENOUS
  Filled 2019-08-08 (×2): qty 2

## 2019-08-08 MED ORDER — PROPOFOL 1000 MG/100ML IV EMUL
5.0000 ug/kg/min | INTRAVENOUS | Status: DC
  Administered 2019-08-08: 11:00:00 5 ug/kg/min via INTRAVENOUS
  Filled 2019-08-08: qty 100

## 2019-08-08 MED ORDER — LABETALOL HCL 5 MG/ML IV SOLN
10.0000 mg | Freq: Four times a day (QID) | INTRAVENOUS | Status: DC | PRN
  Administered 2019-08-08 – 2019-08-11 (×4): 10 mg via INTRAVENOUS
  Filled 2019-08-08 (×4): qty 4

## 2019-08-08 NOTE — Progress Notes (Signed)
Pharmacy Antibiotic Note  SON BONESTEEL is a 48 y.o. male admitted on 07/15/2019 for PEA arrest, previously on Unasyn for aspiration pneumonia. This was stopped on 9/1 for tongue swelling. Now with fevers up to 102, WBC rising to 15.9 and concerned for sepsis of unknown source. Pharmacy has been consulted for vancomycin and cefepime dosing. Patient has HD every TTS. Dialysis session today just began around 10am  Plan: Vancomycin 2,000 mg IV x 1 today, then 1,000 mg IV qHD Cefepime 2 g IV qHD Monitor repeat cultures, clinical status, and vancomycin levels as indicated  Height: 5\' 6"  (167.6 cm) Weight: 218 lb 4.1 oz (99 kg) IBW/kg (Calculated) : 63.8  Temp (24hrs), Avg:102.2 F (39 C), Min:99.6 F (37.6 C), Max:103.2 F (39.6 C)  Recent Labs  Lab 07/20/2019 1127 07/29/2019 1336  08/04/19 0430 08/05/19 0309 08/06/19 0327 08/06/19 0440 08/07/19 0324 08/07/19 1142 08/08/19 0114  WBC 14.2*  --    < > 8.1 9.9 8.8  --   --  12.9* 15.9*  CREATININE  --   --    < > 6.58* 5.30*  --  7.75* 6.43*  --  8.14*  LATICACIDVEN 2.9* 1.3  --   --   --   --   --   --   --   --    < > = values in this interval not displayed.    Estimated Creatinine Clearance: 12.1 mL/min (A) (by C-G formula based on SCr of 8.14 mg/dL (H)).    Allergies  Allergen Reactions  . Shellfish Allergy Nausea And Vomiting    Antimicrobials this admission: Unasyn 8/29 >> 9/1 Vancomycin 9/5 >> Cefepime 9/5 >>  Dose adjustments this admission: None  Microbiology results: 8/29 Bcx - ngF 8/29 TA - normal flora 8/29 MRSA - neg 9/5 BCx - 9/5 UCx -   Thank you for allowing pharmacy to be a part of this patient's care.  Vertis Kelch, PharmD PGY2 Cardiology Pharmacy Resident Phone [phone] 08/08/2019       11:00 AM  Please check [email or web address] for unit-specific pharmacist phone numbers

## 2019-08-08 NOTE — Progress Notes (Signed)
Subjective: Slightly more awake today.   Exam: Vitals:   08/08/19 0730 08/08/19 0739  BP:    Pulse: (!) 113 100  Resp: (!) 27   Temp: (!) 102.2 F (39 C)   SpO2: 97% 100%   Gen: In bed, NAD Resp: non-labored breathing, no acute distress Abd: soft, nt  Neuro: MS: Eyes open to noxious stimulation, does not interact.  CN: Eyes are slightly disconjugate, pupils reactive bilaterally, corneals intact Motor: Flexion to noxious stimulation x 4.  Sensory: As above  Pertinent Labs: Cr 7.75  Impression: 48 year old male with anoxic injury status post cardiac arrest.  MRI reveals restricted diffusion throughout the entire cortex.  Based on his MRI with extensive injury, early myoclonus and exam with no better than extensor posturing now 3 days after rewarming, and EEG with diffuse suppression and no reactivity, I did not feel that he has any chance of recovery to an independent degree of function. He could improve to a vegetative state, but I do not anticipate recovery better than this.   He has had some improvement in exam with eye opening and now flexion as opposed to extension to noxious sitmulation. Given the degree of injury seen, I continue to think that he will not improve to independent function,   But family wants to proceed with trach/peg and if they feel this is what patient would have wanted, not completely unreasonable based on the small amount of clinical improvement we have seen.   Recommendations: 1) Neurology is available on an as needed basis moving forward.   Roland Rack, MD [facility] [phone]  If 7pm- 7am, please page neurology on call as listed in [place]. 08/08/2019  8:22 AM

## 2019-08-08 NOTE — Progress Notes (Signed)
[facility] Progress Note Patient Name: Larry Banks DOB: September 14, 1971 MRN: [id number]   Date of Service  08/08/2019  HPI/Events of Note  Feeding tube, unclog order discussed.   eICU Interventions  Already has. continue care. NG in position.      Intervention Category Minor Interventions: Communication with other healthcare providers and/or family;Other:  Elmer Sow 08/08/2019, 11:31 PM

## 2019-08-08 NOTE — Progress Notes (Signed)
PULMONARY / CRITICAL CARE MEDICINE   NAME:  Larry Banks, MRN:  [id number], DOB:  04-Apr-1971, LOS: 7 ADMISSION DATE:  07/26/2019, CONSULTATION DATE:  07/12/2019  REFERRING MD:  EDP Dr. Tomi Bamberger  , CHIEF COMPLAINT:  Cardiac Arrest/Resp Failure   BRIEF HISTORY:    48 year old male with medical history significant for hypertension, PAD, hyperlipidemia, CKD Stage 5 developed shortness of breath at home.  Patient does have CKD Stage 5 .  Is followed at [facility].  Recently had peritoneal dialysis catheter placed on August 27. Has not started Dialysis yet.  On arrival to the emergency room patient suffered a cardiac arrest with asystole.  He received CPR x15 minutes.  He receieved hyperkalemic protocol due to his underlying chronic kidney disease with 1 amp bicarb, 1 amp D50, insulin 10 units regular IV.  He received epi x3.  He had ROSC after 15 minutes.  He did require very brief Neo-Synephrine for hypotension.  He had significant agitation was started on sedation with Versed and Fentanyl.    Patient was transported to [facility] ICU for [name] to admit.  Remains intubated without significant neurologic recovery. EEG and CT suggestive of severe anoxic brain injury  SIGNIFICANT PAST MEDICAL HISTORY   Hypertension, hyperlipidemia, PAD, chronic kidney disease stage V  SIGNIFICANT EVENTS:  8/26-  TTS HD - last 07/29/2019 [name] (old tunneled Rt HD cah) 8/27 - PD cath - for a planned change from iHD to PD 8/29- Cardiac arrest, CPR x15 minutes, epi x3. Per renal CXR- c/w pulmonary edema 8/31- 1 unit PRBC, paralyzed due to vent dysynchrony 9/1- off pressors  STUDIES:   8/29 CT head  > no acute abnormality 8/29 CT abdomen pelvis > bilateral lower lobe consolidation with small pleural effusion.  Groundglass opacities lower portion of the lung.  Nonobstructive renal calculus, 8/29 Echo > ef 45-50%, hypokineses of the mid-inferior and lateral wall 8/29 - EEG> diffuse slowing and generalized  suppression suggestive of profound encephalopathy, no seizure activity 9/1 CT head>severe diffuse brain edema compatible with widespread hypoxic injury 9/2 EEG> suggestive ofprofound diffuse encephalopathy,could besecondary to anoxic/hypoxic brain injury.No seizures or epileptiform discharges were seen throughout the recording.  9/3 MRI brain>Diffuse hypoxic ischemic injury most pronounced affecting the cerebral hemispheric cortex but with some involvement also of the basal ganglia and thalami. 9/4: CXR personally reviewed> Mild vascular congestion. Slight improved aeration in the bases bilaterally despite a poor inspiratory effort.  CULTURES:  8/29 BC >>Negative  8/29 Sputum >> Rare GNR, GPC>> Normal Flora 8/29 COVID 19 >> NEG  MRSA surveillance negative  9/5 blood culture >> 9/5 urine culture >>  ANTIBIOTICS:  Unasyn 8/29 >9/1  LINES/TUBES:  Peritoneal cath 8/27 (PTA )  R HD cath ? >>  ------- 8/29 ETT >>  8/29 L IJ CVL> order to discontinue on 9/4 8/29 A line>>8/30  CONSULTANTS:  Renal  Neurology  SUBJECTIVE:  Remains off pressors.  Intermittent vent dyssynchrony associated with brief periods of hypoxia and increased secretions requiring sedation and analgesia  CONSTITUTIONAL: BP (!) 154/97   Pulse (!) 102   Temp (!) 102 F (38.9 C)   Resp 19   Ht 5\' 6"  (1.676 m)   Wt 98.4 kg   SpO2 98%   BMI 35.01 kg/m   I/O last 3 completed shifts: In: 1850.1 [I.V.:30.1; NG/GT:1820] Out: 1600 [Urine:1450; Stool:150]     Vent Mode: SIMV;PRVC;PSV FiO2 (%):  [40 %] 40 % Set Rate:  [10 bmp] 10 bmp Vt Set:  [  500 mL] 500 mL PEEP:  [5 cmH20] 5 cmH20 Pressure Support:  [10 cmH20] 10 cmH20 Plateau Pressure:  [13 cmH20-16 cmH20] 16 cmH20  General: Lying in bed no acute distress  HENT: Normocephalic, atraumatic, large tongue protruding from his mouth- unchanged since yesterday Eyes: Scleral edema, bilateral lateral conjunctival hemorrhage.  PERLL  Neck: No JVD.  IJ CVC  removed, site bandaged CV: Regular rate, minimally tachycardic, no murmurs Lungs: Clear to auscultation bilaterally.  Minimal thin secretions from ET tube.  Breathing above the ventilator, tachypneic ABD: Protuberant abdomen, soft, nontender.   GU: Condom cath in place draining clear yellow urine EXT: Less edema Skin: No rashes or wounds  Neuro: Positive tracheal cough, positive pharyngeal gag. PERRL.  Grimaces and withdraws to trapezius squeeze, no response to deep nailbed pressure in his upper extremities.   RESOLVED PROBLEM LIST  NA ASSESSMENT AND PLAN   PEA out of hospital ready active arrest with acute hypoxic respiratory failure and anoxic brain injury.  Etiology of arrest unknown. Transitioned from HD to PD and had missed dialysis the day of admission, presenting K was WNL. S/p 36 degree TTM.  EEG without seizure activity but profound diffuse encephalopathy. -Echo will moderate hypokinesis of the mid-inferior and lateral wall, new since echo in 2019 -MRI with restricted diffusion throughout the entire cortex. P: -Appreciate neurology's assistance.  Family planning on tracheostomy next week to facilitate prolonged opportunity for recovery.  His family is aware of his prognosis. -Patient is at risk for sudden hypoxia, barotrauma and hemodynamic compromise.   -Tidal line ventilation, follow ARDS and PEEP letter.  Titrate FiO2 maintain SpO2 greater than or equal to 90%. -Plateau pressures less than 30 cm H20.  -VAP prevention protocol -SAT/SBT as tolerated-currently contraindicated due to large protuberant tongue and severe encephalopathy. -Neuroprotective measures: Maintain euthermia, euglycemia, eunatremia, normoxia, pCO2 35-40, nutrition and bowel regimen, seizure precautions, head of bed elevated - Restarting propofol for sedation given nurses concern for perceived discomfort.  Would prefer to use short acting medications to facilitate ongoing neurological assessment daily.  Sepsis  of unknown etiology -Chest x-ray -Starting empiric broad-spectrum antibiotics with cefepime and vancomycin.  Appreciate pharmacy's assistance in dosing with dialysis. -blood cultures drawn overnight -UA with reflex culture overnight  ESRD stage 5, urinary retention requiring frequent straight catheterization -receiving HD at [facility], TTS -Peritoneal Cath placed on 8/27 to replace old R IJ P: -HD per nephrology today -Continue to monitor electrolytes and volume status -Foley replaced due to urinary retention  Angioedema of tongue -etiology unclear, no signs of systemic edema, rash or allergic reaction, no ACEI, on ARB at home  P: -steroids and diphenhydramine complete -continue to monitor  Anemia of chronic disease-stable -s/p 1 unit PRBCs 8/29 -iron saturation 3% P: -Consider use her hemoglobin less than 7 unless active bleeding and hemodynamically unstable -Continue to monitor  Hypertension -Labetalol 10 mg IV every 6 hours as needed for systolic blood pressure greater than 180 - Avoiding restarting oral medications given source of no infection and restarting sedation.   Type 2 DM, uncontrolled P: -Adding Lantus 20 units daily  -Cont SSI -Goal BG 140-180 while admitted to the ICU  Secondary Hyperparathyroidism PTH 93 on 8/28 P: -Nepro per Dietician   Diarrhea P: FMS -Monitor I /O   Best Practice / Goals of Care / Disposition.   Diet: Tube feeds per recs Pain/Anxiety/Delirium protocol (if indicated): fentanyl, midazolam prn, propofol infusion VAP protocol (if indicated): ordered DVT prophylaxis: heparin SQ GI prophylaxis: famotidine Glucose  control: SSI, Lantus Mobility: Bed Code Status: Full Family Communication: Wife updated today  Disposition: ICU   This patient is critically ill with multiple organ system failure which requires frequent high complexity decision making, assessment, support, evaluation, and titration of therapies. This was  completed through the application of advanced monitoring technologies and extensive interpretation of multiple databases. During this encounter critical care time was devoted to patient care services described in this note for 50 minutes.  [name], DO 08/08/19 11:21 AM [facility]

## 2019-08-08 NOTE — Progress Notes (Signed)
[facility] Progress Note Patient Name: [name] DOB: 04-01-1971 MRN: [id number]   Date of Service  08/08/2019  HPI/Events of Note  Notified of fever 102.8.  Pt with ESRD on HD, presenting with out of hospital arrest.    He is currently off antibiotics.  eICU Interventions  Send blood cultures.  Send urinalysis and culture.  Ok to apply cooling blanket.     Intervention Category Intermediate Interventions: Other:  Elsie Lincoln 08/08/2019, 12:26 AM

## 2019-08-08 NOTE — Progress Notes (Signed)
Larry Banks PROGRESS NOTE  Assessment/ Plan: Pt is a 48 y.o. yo male with hypertension, DM, recent ESRD, underwent placement of PD catheter on 8/27, was receiving dialysis TTS at [facility], presented to [facility] ER with SOB and had a cardiac arrest.    # ESRD, recent start: TTS.  He has right IJ TDC.  Tolerating intermittent hemo-.  Plan for dialysis today.  Urine output is around 1.4 L.  He has temperature of 102.  The tunnel catheter site and PD catheter site looks clean, no sign of infection.  BP acceptable, off pressor.    # Anemia due to chronic disease:  Received blood transfusion.  Iron saturation only 3%.  Oredred IV iron however it was discontinued because of concern of reaction.  Continue Aranesp.  Transfuse as needed.  # Secondary hyperparathyroidism: PTH 93 on 07/24/2019. Changed tube feeding to Nepro.  Phos high, increased the dose of phoslyra.   # HTN/volume blood pressure soft this morning.  Continue to monitor. Off pressor.  #Cardiac arrest with elevated troponin  #Acute respiratory failure with hypoxia on ventilator: Per pulmonary team.  #Acute encephalopathy/acute hypoxic brain injury: Seen by neurology.  Seems poor long-term prognosis.  Discussed with ICU nurse.  Subjective: Seen and examined in ICU.  No neurological improvement.  Temperature up to 102.  Review of system limited.    Objective Vital signs in last 24 hours: Vitals:   08/08/19 0630 08/08/19 0700 08/08/19 0730 08/08/19 0739  BP:  (!) 158/106    Pulse: (!) 109 (!) 110 (!) 113 100  Resp: (!) 23 (!) 24 (!) 27   Temp: (!) 102.7 F (39.3 C) (!) 102.4 F (39.1 C) (!) 102.2 F (39 C)   TempSrc:      SpO2: 98% 98% 97% 100%  Weight:      Height:       Weight change: 1.6 kg  Intake/Output Summary (Last 24 hours) at 08/08/2019 0848 Last data filed at 08/08/2019 0700 Gross per 24 hour  Intake 1163 ml  Output 1600 ml  Net -437 ml       Labs: Basic Metabolic Panel: Recent Labs   Lab 08/06/19 0327 08/06/19 0440 08/07/19 0324 08/08/19 0114  NA  --  133* 137 139  K  --  5.6* 4.8 4.1  CL  --  94* 96* 96*  CO2  --  _0 GLUCOSE  --  214* 212* 201*  BUN  --  89* 82* 126*  CREATININE  --  7.75* 6.43* 8.14*  CALCIUM  --  7.0* 7.6* 7.6*  PHOS 8.0*  --  7.9* 6.0*   Liver Function Tests: Recent Labs  Lab 08/02/19 0436 08/07/19 0324 08/08/19 0114  AST 33  --   --   ALT 22  --   --   ALKPHOS 66  --   --   BILITOT 0.6  --   --   PROT 5.7*  --   --   ALBUMIN 2.9* 2.9* 2.9*   No results for input(s): LIPASE, AMYLASE in the last 168 hours. No results for input(s): AMMONIA in the last 168 hours. CBC: Recent Labs  Lab 08/04/19 0430 08/05/19 0309 08/06/19 0327 08/07/19 1142 08/08/19 0114  WBC 8.1 9.9 8.8 12.9* 15.9*  NEUTROABS 6.3 7.8* 7.7  --   --   HGB 7.4* 8.8* 7.5* 8.2* 8.7*  HCT 24.1* 29.5* 25.0* 27.0* 28.3*  MCV 92.7 95.2 94.0 93.1 92.5  PLT 284 332 290  330 372   Cardiac Enzymes: No results for input(s): CKTOTAL, CKMB, CKMBINDEX, TROPONINI in the last 168 hours. CBG: Recent Labs  Lab 08/07/19 1528 08/07/19 2008 08/08/19 0009 08/08/19 0335 08/08/19 0731  GLUCAP 199* 142* 215* 195* 210*    Iron Studies:  No results for input(s): IRON, TIBC, TRANSFERRIN, FERRITIN in the last 72 hours. Studies/Results: Mr Brain Wo Contrast  Result Date: 08/06/2019 CLINICAL DATA:  Follow-up cardiac arrest. EXAM: MRI HEAD WITHOUT CONTRAST TECHNIQUE: Multiplanar, multiecho pulse sequences of the brain and surrounding structures were obtained without intravenous contrast. COMPARISON:  Head CT 08/04/2019 FINDINGS: Brain: Today's study shows widespread hypoxic ischemic injury to the cerebral hemispheric cortex, basal ganglia and thalami. No evidence of mass effect or herniation. No hemorrhage. No hydrocephalus or extra-axial collection. Vascular: Major vessels at the base of the brain show flow. Skull and upper cervical spine: Negative Sinuses/Orbits: Mucosal  thickening and fluid within the paranasal sinuses, particularly the sphenoid sinus. Orbits negative. Bilateral mastoid effusions. Other: None IMPRESSION: Diffuse hypoxic ischemic injury most pronounced affecting the cerebral hemispheric cortex but with some involvement also of the basal ganglia and thalami. Electronically Signed   By: [name] Chimes M.D.   On: 08/06/2019 13:55   Dg Chest Port 1 View  Result Date: 08/07/2019 CLINICAL DATA:  Acute respiratory failure EXAM: PORTABLE CHEST 1 VIEW COMPARISON:  08/06/2019 FINDINGS: Cardiac shadow is stable. Endotracheal tube and gastric catheter are again seen and stable. Left subclavian line and right jugular dialysis catheter are again noted and stable. The overall inspiratory effort is again poor although improved aeration in the bases is seen. Mild central vascular congestion is noted. IMPRESSION: Mild vascular congestion. Slight improved aeration in the bases bilaterally despite a poor inspiratory effort. Tubes and lines as described. Electronically Signed   By: Inez Catalina M.D.   On: 08/07/2019 06:50    Medications: Infusions: . sodium chloride Stopped (07/27/2019 1242)  . sodium chloride Stopped (08/06/19 1142)  . sodium chloride    . sodium chloride    . sodium chloride    . sodium chloride    . norepinephrine (LEVOPHED) Adult infusion Stopped (08/05/19 6503)    Scheduled Medications: . acetaminophen (TYLENOL) oral liquid 160 mg/5 mL  650 mg Per Tube Q6H  . calcium acetate (Phos Binder)  1,334 mg Per Tube TID WC  . chlorhexidine gluconate (MEDLINE KIT)  15 mL Mouth Rinse BID  . Chlorhexidine Gluconate Cloth  6 each Topical Daily  . Chlorhexidine Gluconate Cloth  6 each Topical Q0600  . Chlorhexidine Gluconate Cloth  6 each Topical Q0600  . darbepoetin (ARANESP) injection - DIALYSIS  60 mcg Intravenous Q Tue-HD  . famotidine  10 mg Per Tube BID  . feeding supplement (NEPRO CARB STEADY)  1,000 mL Per Tube Q24H  . feeding supplement (PRO-STAT  SUGAR FREE 64)  60 mL Per Tube TID  . heparin  5,000 Units Subcutaneous Q8H  . insulin aspart  0-20 Units Subcutaneous Q4H  . mouth rinse  15 mL Mouth Rinse 10 times per day  . sodium chloride flush  10-40 mL Intracatheter Q12H  . sodium chloride flush  3 mL Intravenous Q12H    have reviewed scheduled and prn medications.  Physical Exam: Unchanged General: Sedated, intubated, not responding Heart:RRR, s1s2 nl, no rubs Lungs: Coarse breath sound bilateral, no wheezing Abdomen:soft, mild distention, PD catheter site looks clean Extremities: No edema  Dialysis Access: Right IJ tunnel catheter, has a PD catheter site clean.  Both catheter site  looks clean, no erythema or drainage.  Dron Prasad Bhandari 08/08/2019,8:48 AM  LOS: 7 days  Pager: [phone]

## 2019-08-08 NOTE — Plan of Care (Signed)
  Problem: Nutrition: Goal: Adequate nutrition will be maintained Outcome: Progressing   Problem: Elimination: Goal: Will not experience complications related to bowel motility Outcome: Progressing   

## 2019-08-09 DIAGNOSIS — Z978 Presence of other specified devices: Secondary | ICD-10-CM

## 2019-08-09 DIAGNOSIS — A419 Sepsis, unspecified organism: Secondary | ICD-10-CM

## 2019-08-09 LAB — RENAL FUNCTION PANEL
Albumin: 2.6 g/dL — ABNORMAL LOW (ref 3.5–5.0)
Anion gap: 18 — ABNORMAL HIGH (ref 5–15)
BUN: 93 mg/dL — ABNORMAL HIGH (ref 6–20)
CO2: 24 mmol/L (ref 22–32)
Calcium: 8.5 mg/dL — ABNORMAL LOW (ref 8.9–10.3)
Chloride: 96 mmol/L — ABNORMAL LOW (ref 98–111)
Creatinine, Ser: 6.29 mg/dL — ABNORMAL HIGH (ref 0.61–1.24)
GFR calc Af Amer: 11 mL/min — ABNORMAL LOW (ref >60)
GFR calc non Af Amer: 10 mL/min — ABNORMAL LOW (ref >60)
Glucose, Bld: 181 mg/dL — ABNORMAL HIGH (ref 70–99)
Phosphorus: 7.8 mg/dL — ABNORMAL HIGH (ref 2.5–4.6)
Potassium: 4.4 mmol/L (ref 3.5–5.1)
Sodium: 138 mmol/L (ref 135–145)

## 2019-08-09 LAB — CBC
HCT: 28.9 % — ABNORMAL LOW (ref 39.0–52.0)
Hemoglobin: 9 g/dL — ABNORMAL LOW (ref 13.0–17.0)
MCH: 28.7 pg (ref 26.0–34.0)
MCHC: 31.1 g/dL (ref 30.0–36.0)
MCV: 92 fL (ref 80.0–100.0)
Platelets: 348 10*3/uL (ref 150–400)
RBC: 3.14 MIL/uL — ABNORMAL LOW (ref 4.22–5.81)
RDW: 14.2 % (ref 11.5–15.5)
WBC: 17.3 10*3/uL — ABNORMAL HIGH (ref 4.0–10.5)
nRBC: 0.2 % (ref 0.0–0.2)

## 2019-08-09 LAB — GLUCOSE, CAPILLARY
Glucose-Capillary: 155 mg/dL — ABNORMAL HIGH (ref 70–99)
Glucose-Capillary: 146 mg/dL — ABNORMAL HIGH (ref 70–99)
Glucose-Capillary: 179 mg/dL — ABNORMAL HIGH (ref 70–99)
Glucose-Capillary: 171 mg/dL — ABNORMAL HIGH (ref 70–99)
Glucose-Capillary: 184 mg/dL — ABNORMAL HIGH (ref 70–99)

## 2019-08-09 LAB — URINE CULTURE: Culture: NO GROWTH

## 2019-08-09 NOTE — Plan of Care (Signed)
  Problem: Skin Integrity: Goal: Risk for impaired skin integrity will be minimized. Outcome: Progressing

## 2019-08-09 NOTE — Progress Notes (Signed)
Assisted tele visit to patient with wife.  Zofia Peckinpaugh M, RN  

## 2019-08-09 NOTE — Progress Notes (Signed)
Video call completed with pastor per wife's request

## 2019-08-09 NOTE — Progress Notes (Signed)
Assisted tele visit to patient with wife.  Tereza Gilham M, RN  

## 2019-08-09 NOTE — Progress Notes (Signed)
Video call with wife completed

## 2019-08-09 NOTE — Progress Notes (Signed)
Assisted tele visit to patient with family member.  Alysiah Suppa M, RN  

## 2019-08-09 NOTE — Progress Notes (Signed)
PULMONARY / CRITICAL CARE MEDICINE   NAME:  Larry Banks, MRN:  [id number], DOB:  1971/01/26, LOS: 8 ADMISSION DATE:  07/07/2019, CONSULTATION DATE:  07/14/2019  REFERRING MD:  EDP Dr. Tomi Bamberger   CHIEF COMPLAINT:  Cardiac Arrest/Resp Failure   BRIEF HISTORY:    48 year old male with medical history significant for hypertension, PAD, hyperlipidemia, CKD Stage 5 developed shortness of breath at home.  Patient does have CKD Stage 5 .  Is followed at [facility].  Recently had peritoneal dialysis catheter placed on August 27. Has not started Dialysis yet.  On arrival to the emergency room patient suffered a cardiac arrest with asystole.  He received CPR x15 minutes.  He receieved hyperkalemic protocol due to his underlying chronic kidney disease with 1 amp bicarb, 1 amp D50, insulin 10 units regular IV.  He received epi x3.  He had ROSC after 15 minutes.  He did require very brief Neo-Synephrine for hypotension.  He had significant agitation was started on sedation with Versed and Fentanyl.    Patient was transported to [facility] ICU for [name] to admit.  Remains intubated without significant neurologic recovery. EEG and CT suggestive of severe anoxic brain injury  SIGNIFICANT PAST MEDICAL HISTORY   Hypertension, hyperlipidemia, PAD, chronic kidney disease stage V  SIGNIFICANT EVENTS:  8/26-  TTS HD - last 07/29/2019 [name] (old tunneled Rt HD cah) 8/27 - PD cath - for a planned change from iHD to PD 8/29- Cardiac arrest, CPR x15 minutes, epi x3. Per renal CXR- c/w pulmonary edema 8/31- 1 unit PRBC, paralyzed due to vent dysynchrony 9/1- off pressors  STUDIES:   8/29 CT head  > no acute abnormality 8/29 CT abdomen pelvis > bilateral lower lobe consolidation with small pleural effusion.  Groundglass opacities lower portion of the lung.  Nonobstructive renal calculus, 8/29 Echo > ef 45-50%, hypokineses of the mid-inferior and lateral wall 8/29 - EEG> diffuse slowing and generalized  suppression suggestive of profound encephalopathy, no seizure activity 9/1 CT head>severe diffuse brain edema compatible with widespread hypoxic injury 9/2 EEG> suggestive ofprofound diffuse encephalopathy,could besecondary to anoxic/hypoxic brain injury.No seizures or epileptiform discharges were seen throughout the recording.  9/3 MRI brain>Diffuse hypoxic ischemic injury most pronounced affecting the cerebral hemispheric cortex but with some involvement also of the basal ganglia and thalami. 9/4 & 9/5: CXR- no opacities  CULTURES:  8/29 BC >>Negative  8/29 Sputum >> Rare GNR, GPC>> Normal Flora 8/29 COVID 19 >> NEG  MRSA surveillance negative  9/5 blood culture >> 9/5 urine culture >>  ANTIBIOTICS:  Unasyn 8/29 >9/1 Cefepime 9/5> vanc 9/5 >  LINES/TUBES:  Peritoneal cath 8/27 (PTA )  R HD cath ? >>  ------- 8/29 ETT >>  8/29 L IJ CVL> removed 9/4 8/29 A line>>8/30 Condom cath  CONSULTANTS:  Nephrology  Neurology  SUBJECTIVE:  Fever & arrhythmias resolved overnight. No new issues. Still having liquid stools. 1x spontaneous void overnight & I&O cath this morning for large bladder volume  CONSTITUTIONAL: BP 113/71   Pulse 79   Temp 98.6 F (37 C)   Resp (!) 21   Ht 5\' 6"  (1.676 m)   Wt 94.4 kg   SpO2 100%   BMI 33.59 kg/m   I/O last 3 completed shifts: In: 1943.5 [I.V.:278.5; NG/GT:1050; IV Piggyback:615.1] Out: [id number]; Other:3000; Stool:250]     Vent Mode: CPAP;PSV FiO2 (%):  [40 %] 40 % Set Rate:  [10 bmp] 10 bmp Vt Set:  [500 mL] 500  mL PEEP:  [2 cmH20-5 cmH20] 2 cmH20 Pressure Support:  [10 cmH20-12 cmH20] 12 cmH20 Plateau Pressure:  [12 cmH20-17 cmH20] 12 cmH20  General: Middle-aged man lying in bed in no acute distress HENT: Normocephalic, atraumatic.  Large tongue continues to protrude, unchanged. Eyes: Scleral edema unchanged.  PERRL. CV: Regular rate, regular rhythm, no murmurs.   Lungs: Clear to auscultation bilaterally.  Very  minimal thin, clear secretions from ET tube.  Breathing comfortably on CPAP +12 PS ABD: Protuberant abdomen, soft, nontender GU: Condom cath in place draining clear yellow urine EXT: No significant peripheral edema Skin: No rashes.  No erythema around right IJ tunneled cath or peritoneal dialysis catheter. Neuro: Examined on low-dose Precedex.  No response to verbal stimulation.  No withdrawal to trapezius squeeze.  Positive corneal, pupillary, tracheal cough reflexes.  No pharyngeal gag.  No withdrawal to painful stimulation in any extremity.   RESOLVED PROBLEM LIST  NA ASSESSMENT AND PLAN   PEA out of hospital ready active arrest with acute hypoxic respiratory failure and anoxic brain injury.  Etiology of arrest unknown. Transitioned from HD to PD and had missed dialysis the day of admission, presenting K was WNL. S/p 36 degree TTM.  EEG without seizure activity but profound diffuse encephalopathy. -Echo will moderate hypokinesis of the mid-inferior and lateral wall, new since echo in 2019 -MRI with restricted diffusion throughout the entire cortex. P: -Appreciate neurology's assistance in his care.  His family has elected to pursue tracheostomy to facilitate a prolonged, but time-limited trial for recovery. -Low tidal volume ventilation.  Titrate down FiO2 as able to maintain saturations greater than 90%. -Maintain plateau pressure < 30 -VAP prevention Vapne protocol -Daily SAT and SBT as tolerated.  Would not be a candidate for extubation at this time due to mental status and concern for airway obstruction from large tongue. -Neuro protective measures- control of fevers, maintain head of bed greater than 30 degrees, maintain normovolemia, eucapnia, normal electrolytes. - Trial off of Precedex today for spontaneous awakening trial.  Will leave off if he does not appear uncomfortable.  Sepsis of unknown etiology.  No infiltrates on x-ray or sputum.  Remaining indwelling lines are long-term  dialysis catheter and peritoneal catheter, both of which do not appear infected.  UA from 9/5 not concerning for infection. -Continue broad-spectrum antibiotics -Continue to follow blood cultures -Appreciate pharmacy's assistance with dosing related to his renal dysfunction and dialysis schedule  ESRD stage 5, urinary retention requiring frequent straight catheterization -receiving HD at [facility], TTS -Peritoneal Cath placed on 8/27 to replace old R IJ P: -HD per nephrology; last dialysis on 9/5 -No urgent indications for dialysis.  Angioedema of tongue -etiology unclear, no signs of systemic edema, rash or allergic reaction, no ACEI, on ARB at home  P: -steroids and diphenhydramine complete -continue to monitor  Anemia of chronic disease-stable -s/p 1 unit PRBCs 8/29 -iron saturation 3% P: -No indication for transfusion unless hemoglobin less than 7 or active bleeding with hemodynamic instability - Continue to monitor  Hypertension-improved -Labetalol as needed for SBP > 180 - As his infection resolves if his blood pressure remains elevated can restart oral medication   Type 2 DM, uncontrolled, but improving since our glargine started P: -Continue Lantus 20 units daily  -Continue sliding scale insulin PRN -Goal blood glucose 140-180 while admitted to the ICU  Secondary Hyperparathyroidism PTH 93 on 8/28 P: -Nepro tube feed  Diarrhea P: FMS -Monitor I/O and discontinue if liquid stool cease  Best Practice / Goals of Care / Disposition.   Diet: Tube feeds at goal Pain/Anxiety/Delirium protocol (if indicated): fentanyl, midazolam prn, Precedex VAP protocol (if indicated): ordered DVT prophylaxis: heparin SQ GI prophylaxis: famotidine Glucose control: SSI, Lantus Mobility: Bed Code Status: Full Family Communication: Family to be updated over the phone later today- his wife is not planning to visit today. Disposition: ICU  This patient is critically ill  with multiple organ system failure which requires frequent high complexity decision making, assessment, support, evaluation, and titration of therapies. This was completed through the application of advanced monitoring technologies and extensive interpretation of multiple databases. During this encounter critical care time was devoted to patient care services described in this note for 30 minutes.  [name], DO 08/09/19 8:15 AM [facility]

## 2019-08-09 NOTE — Progress Notes (Signed)
[facility] NEPHROLOGY PROGRESS NOTE  Assessment/ Plan: Pt is a 48 y.o. yo male with hypertension, DM, recent ESRD, underwent placement of PD catheter on 8/27, was receiving dialysis TTS at [facility], presented to [facility] ER with SOB and had a cardiac arrest.    # ESRD, recent start: TTS.  He has right IJ TDC.  Tolerating intermittent HD.  Status post dialysis yesterday with 3 L UF, urine output is around 1.1 L.  Looks stable today.  Plan for next dialysis on Tuesday.    # Anemia due to chronic disease:  Received blood transfusion.  Iron saturation only 3%.  Oredred IV iron however it was discontinued because of concern of reaction.  Continue Aranesp.  Transfuse as needed.  # Secondary hyperparathyroidism: PTH 93 on 07/23/2019. Changed tube feeding to Nepro.  Phos high, increased the dose of phoslyra.  Patient is n.p.o.  # HTN/volume blood pressure soft this morning.  Continue to monitor. Off pressor.  #Cardiac arrest with elevated troponin  #Acute respiratory failure with hypoxia on ventilator: Plan for tracheostomy noted.  Per pulmonary team.  #Acute encephalopathy/acute hypoxic brain injury: Seen by neurology.  Seems poor long-term prognosis.  Discussed with ICU nurse.  Subjective: Seen and examined in ICU.  No neurological improvement.  Tolerated dialysis well.  Off pressors today.  Objective Vital signs in last 24 hours: Vitals:   08/09/19 0600 08/09/19 0700 08/09/19 0737 08/09/19 0800  BP: 117/79 113/79 113/71 (!) 122/98  Pulse: 69 70 79 95  Resp: 18 13 (!) 21 (!) 23  Temp: 98.4 F (36.9 C) 98.6 F (37 C)  99 F (37.2 C)  TempSrc:      SpO2: 100% 100% 100% 100%  Weight:      Height:       Weight change: 0.6 kg  Intake/Output Summary (Last 24 hours) at 08/09/2019 0840 Last data filed at 08/09/2019 0810 Gross per 24 hour  Intake 1480.54 ml  Output 4275 ml  Net -2794.46 ml       Labs: Basic Metabolic Panel: Recent Labs  Lab 08/07/19 0324  08/08/19 0114 08/09/19 0305  NA 137 139 138  K 4.8 4.1 4.4  CL 96* 96* 96*  CO2 '25 23 24  '$ GLUCOSE 212* 201* 181*  BUN 82* 126* 93*  CREATININE 6.43* 8.14* 6.29*  CALCIUM 7.6* 7.6* 8.5*  PHOS 7.9* 6.0* 7.8*   Liver Function Tests: Recent Labs  Lab 08/07/19 0324 08/08/19 0114 08/09/19 0305  ALBUMIN 2.9* 2.9* 2.6*   No results for input(s): LIPASE, AMYLASE in the last 168 hours. No results for input(s): AMMONIA in the last 168 hours. CBC: Recent Labs  Lab 08/04/19 0430 08/05/19 0309 08/06/19 0327 08/07/19 1142 08/08/19 0114 08/09/19 0305  WBC 8.1 9.9 8.8 12.9* 15.9* 17.3*  NEUTROABS 6.3 7.8* 7.7  --   --   --   HGB 7.4* 8.8* 7.5* 8.2* 8.7* 9.0*  HCT 24.1* 29.5* 25.0* 27.0* 28.3* 28.9*  MCV 92.7 95.2 94.0 93.1 92.5 92.0  PLT 284 332 290 330 372 348   Cardiac Enzymes: No results for input(s): CKTOTAL, CKMB, CKMBINDEX, TROPONINI in the last 168 hours. CBG: Recent Labs  Lab 08/08/19 1524 08/08/19 1926 08/08/19 2312 08/09/19 0306 08/09/19 0736  GLUCAP 223* 205* 188* 155* 146*    Iron Studies:  No results for input(s): IRON, TIBC, TRANSFERRIN, FERRITIN in the last 72 hours. Studies/Results: Dg Chest 1 View  Result Date: 08/08/2019 CLINICAL DATA:  Fever. Respiratory distress. EXAM: CHEST  1  VIEW COMPARISON:  August 07, 2019 FINDINGS: Stable support apparatus. Cardiomediastinal silhouette is normal. Mediastinal contours appear intact. Low lung volumes. Mild vascular congestion. Osseous structures are without acute abnormality. Soft tissues are grossly normal. IMPRESSION: Low lung volumes with mild vascular congestion. Electronically Signed   By: Fidela Salisbury M.D.   On: 08/08/2019 13:01    Medications: Infusions: . sodium chloride 250 mL (08/08/19 1317)  . ceFEPime (MAXIPIME) IV Stopped (08/08/19 1406)  . dexmedetomidine (PRECEDEX) IV infusion 0.5 mcg/kg/hr (08/09/19 0636)  . norepinephrine (LEVOPHED) Adult infusion Stopped (08/05/19 6438)  . [START ON  08/11/2019] vancomycin      Scheduled Medications: . acetaminophen (TYLENOL) oral liquid 160 mg/5 mL  650 mg Per Tube Q6H  . calcium acetate (Phos Binder)  1,334 mg Per Tube TID WC  . chlorhexidine gluconate (MEDLINE KIT)  15 mL Mouth Rinse BID  . Chlorhexidine Gluconate Cloth  6 each Topical Daily  . darbepoetin (ARANESP) injection - DIALYSIS  60 mcg Intravenous Q Tue-HD  . famotidine  10 mg Per Tube BID  . feeding supplement (NEPRO CARB STEADY)  1,000 mL Per Tube Q24H  . feeding supplement (PRO-STAT SUGAR FREE 64)  60 mL Per Tube TID  . heparin  5,000 Units Subcutaneous Q8H  . insulin aspart  0-20 Units Subcutaneous Q4H  . insulin glargine  20 Units Subcutaneous Daily  . mouth rinse  15 mL Mouth Rinse 10 times per day  . sodium chloride flush  3 mL Intravenous Q12H    have reviewed scheduled and prn medications.  Physical Exam: Unchanged General: Sedated, intubated, not responding, large tongue Heart:RRR, s1s2 nl, no rubs Lungs: Coarse breath sound bilateral, no wheezing Abdomen:soft, mild distention, PD catheter site looks clean Extremities: No edema  Dialysis Access: Right IJ tunnel catheter, has a PD catheter site clean.  Both catheter site looks clean, no erythema or drainage.  Dron Prasad Bhandari 08/09/2019,8:40 AM  LOS: 8 days  Pager: [phone]

## 2019-08-10 DIAGNOSIS — E1159 Type 2 diabetes mellitus with other circulatory complications: Secondary | ICD-10-CM

## 2019-08-10 DIAGNOSIS — I10 Essential (primary) hypertension: Secondary | ICD-10-CM

## 2019-08-10 DIAGNOSIS — Z9911 Dependence on respirator [ventilator] status: Secondary | ICD-10-CM

## 2019-08-10 LAB — CBC
HCT: 30.2 % — ABNORMAL LOW (ref 39.0–52.0)
Hemoglobin: 9.3 g/dL — ABNORMAL LOW (ref 13.0–17.0)
MCH: 28 pg (ref 26.0–34.0)
MCHC: 30.8 g/dL (ref 30.0–36.0)
MCV: 91 fL (ref 80.0–100.0)
Platelets: 363 10*3/uL (ref 150–400)
RBC: 3.32 MIL/uL — ABNORMAL LOW (ref 4.22–5.81)
RDW: 14.4 % (ref 11.5–15.5)
WBC: 18.5 10*3/uL — ABNORMAL HIGH (ref 4.0–10.5)
nRBC: 0 % (ref 0.0–0.2)

## 2019-08-10 LAB — GLUCOSE, CAPILLARY
Glucose-Capillary: 181 mg/dL — ABNORMAL HIGH (ref 70–99)
Glucose-Capillary: 183 mg/dL — ABNORMAL HIGH (ref 70–99)
Glucose-Capillary: 187 mg/dL — ABNORMAL HIGH (ref 70–99)
Glucose-Capillary: 195 mg/dL — ABNORMAL HIGH (ref 70–99)
Glucose-Capillary: 203 mg/dL — ABNORMAL HIGH (ref 70–99)
Glucose-Capillary: 204 mg/dL — ABNORMAL HIGH (ref 70–99)
Glucose-Capillary: 221 mg/dL — ABNORMAL HIGH (ref 70–99)

## 2019-08-10 LAB — HEPATIC FUNCTION PANEL
ALT: 25 U/L (ref 0–44)
AST: 31 U/L (ref 15–41)
Albumin: 2.6 g/dL — ABNORMAL LOW (ref 3.5–5.0)
Alkaline Phosphatase: 68 U/L (ref 38–126)
Bilirubin, Direct: <0.1 mg/dL (ref 0.0–0.2)
Total Bilirubin: 0.6 mg/dL (ref 0.3–1.2)
Total Protein: 6 g/dL — ABNORMAL LOW (ref 6.5–8.1)

## 2019-08-10 LAB — RENAL FUNCTION PANEL
Albumin: 2.5 g/dL — ABNORMAL LOW (ref 3.5–5.0)
Anion gap: 19 — ABNORMAL HIGH (ref 5–15)
BUN: 144 mg/dL — ABNORMAL HIGH (ref 6–20)
CO2: 21 mmol/L — ABNORMAL LOW (ref 22–32)
Calcium: 8.3 mg/dL — ABNORMAL LOW (ref 8.9–10.3)
Chloride: 99 mmol/L (ref 98–111)
Creatinine, Ser: 8.55 mg/dL — ABNORMAL HIGH (ref 0.61–1.24)
GFR calc Af Amer: 8 mL/min — ABNORMAL LOW (ref >60)
GFR calc non Af Amer: 7 mL/min — ABNORMAL LOW (ref >60)
Glucose, Bld: 187 mg/dL — ABNORMAL HIGH (ref 70–99)
Phosphorus: 8.5 mg/dL — ABNORMAL HIGH (ref 2.5–4.6)
Potassium: 4.2 mmol/L (ref 3.5–5.1)
Sodium: 139 mmol/L (ref 135–145)

## 2019-08-10 MED ORDER — INSULIN GLARGINE 100 UNIT/ML ~~LOC~~ SOLN
30.0000 [IU] | Freq: Every day | SUBCUTANEOUS | Status: DC
  Administered 2019-08-11 – 2019-08-18 (×8): 30 [IU] via SUBCUTANEOUS
  Filled 2019-08-10 (×8): qty 0.3

## 2019-08-10 MED ORDER — AMLODIPINE BESYLATE 5 MG PO TABS
5.0000 mg | ORAL_TABLET | Freq: Every day | ORAL | Status: DC
  Administered 2019-08-10 – 2019-08-25 (×15): 5 mg
  Filled 2019-08-10 (×12): qty 1
  Filled 2019-08-10: qty 2
  Filled 2019-08-10 (×3): qty 1
  Filled 2019-08-10: qty 2

## 2019-08-10 NOTE — Progress Notes (Signed)
Assisted tele visit to patient with family member.  Vonzell Lindblad R, RN  

## 2019-08-10 NOTE — Plan of Care (Signed)
  Problem: Education: Goal: Knowledge of General Education information will improve Description: Including pain rating scale, medication(s)/side effects and non-pharmacologic comfort measures Outcome: Not Progressing   Problem: Health Behavior/Discharge Planning: Goal: Ability to manage health-related needs will improve Outcome: Not Progressing   Problem: Clinical Measurements: Goal: Ability to maintain clinical measurements within normal limits will improve Outcome: Not Progressing Goal: Will remain free from infection Outcome: Not Progressing Goal: Diagnostic test results will improve Outcome: Not Progressing Goal: Respiratory complications will improve Outcome: Not Progressing Goal: Cardiovascular complication will be avoided Outcome: Not Progressing   Problem: Activity: Goal: Risk for activity intolerance will decrease Outcome: Not Progressing   Problem: Nutrition: Goal: Adequate nutrition will be maintained Outcome: Not Progressing   Problem: Coping: Goal: Level of anxiety will decrease Outcome: Not Progressing   Problem: Elimination: Goal: Will not experience complications related to bowel motility Outcome: Not Progressing Goal: Will not experience complications related to urinary retention Outcome: Not Progressing   Problem: Pain Managment: Goal: General experience of comfort will improve Outcome: Not Progressing   Problem: Skin Integrity: Goal: Risk for impaired skin integrity will decrease Outcome: Not Progressing   Problem: Activity: Goal: Ability to tolerate increased activity will improve Outcome: Not Progressing   Problem: Respiratory: Goal: Ability to maintain a clear airway and adequate ventilation will improve Outcome: Not Progressing   Problem: Role Relationship: Goal: Method of communication will improve Outcome: Not Progressing   Problem: Education: Goal: Ability to manage disease process will improve Outcome: Not Progressing    Problem: Cardiac: Goal: Ability to achieve and maintain adequate cardiopulmonary perfusion will improve Outcome: Not Progressing   Problem: Neurologic: Goal: Promote progressive neurologic recovery Outcome: Not Progressing   Problem: Skin Integrity: Goal: Risk for impaired skin integrity will be minimized. Outcome: Not Progressing

## 2019-08-10 NOTE — Progress Notes (Signed)
Video call completed with wife and son. Updated on vent wean for 4 hours today.

## 2019-08-10 NOTE — Progress Notes (Signed)
Admit: 07/16/2019 LOS: 9  10M ESRD (TDC THS via RIJ TDC; also s/p recent PD cath 8/27) s/p cardiac arrest 07/16/2019  Subjective:  . No improvement neurologically .  Intermittent fevers, B Cx NGTD . Cefepime empirically  09/06 0701 - 09/07 0700 In: 1014.4 [I.V.:361; NG/GT:653.3] Out: 750 [Urine:700; Stool:50]  Filed Weights   08/08/19 1351 08/09/19 0230 08/10/19 0100  Weight: 96.2 kg 94.4 kg 92.6 kg    Scheduled Meds: . acetaminophen (TYLENOL) oral liquid 160 mg/5 mL  650 mg Per Tube Q6H  . calcium acetate (Phos Binder)  1,334 mg Per Tube TID WC  . chlorhexidine gluconate (MEDLINE KIT)  15 mL Mouth Rinse BID  . Chlorhexidine Gluconate Cloth  6 each Topical Daily  . darbepoetin (ARANESP) injection - DIALYSIS  60 mcg Intravenous Q Tue-HD  . famotidine  10 mg Per Tube BID  . feeding supplement (NEPRO CARB STEADY)  1,000 mL Per Tube Q24H  . feeding supplement (PRO-STAT SUGAR FREE 64)  60 mL Per Tube TID  . heparin  5,000 Units Subcutaneous Q8H  . insulin aspart  0-20 Units Subcutaneous Q4H  . insulin glargine  20 Units Subcutaneous Daily  . mouth rinse  15 mL Mouth Rinse 10 times per day  . sodium chloride flush  3 mL Intravenous Q12H   Continuous Infusions: . sodium chloride 250 mL (08/09/19 1618)  . ceFEPime (MAXIPIME) IV Stopped (08/08/19 1406)  . dexmedetomidine (PRECEDEX) IV infusion Stopped (08/10/19 1610)  . norepinephrine (LEVOPHED) Adult infusion Stopped (08/05/19 9604)  . [START ON 08/11/2019] vancomycin     PRN Meds:.sodium chloride, acetaminophen (TYLENOL) oral liquid 160 mg/5 mL, albuterol, alteplase, alteplase, fentaNYL (SUBLIMAZE) injection, heparin, heparin, heparin, labetalol, midazolam, ondansetron (ZOFRAN) IV, pentafluoroprop-tetrafluoroeth, pentafluoroprop-tetrafluoroeth, sodium chloride flush  Current Labs: reviewed    Physical Exam:  Blood pressure 124/88, pulse 100, temperature 100 F (37.8 C), temperature source Core, resp. rate (!) 21, height _0  (1.676  m), weight 92.6 kg, SpO2 98 %. Intubated Not responsive RRR nl s1s2 No sig LEE R IJ TDC C/D/I, no erythema or purulent drainage  A 1. ESRD THS Via R IJ TDC with [facility] 2. S/p cardiac arrest 3. S/p PD cath placement 8/27 4. VDRF 5. ANoxic brain injury 6. Hyperphosphatemia on binders 7. Fevers/Leukocytsois, B Cx NGTD, CEfepime/Vanc   P . HD tomorrow, 4h, TDC 400/800, 2K, tight heparin . Medication Issues; o Preferred narcotic agents for pain control are hydromorphone, fentanyl, and methadone. Morphine should not be used.  o Baclofen should be avoided o Avoid oral sodium phosphate and magnesium citrate based laxatives / bowel preps    Pearson Grippe MD 08/10/2019, 9:41 AM  Recent Labs  Lab 08/08/19 0114 08/09/19 0305 08/10/19 0422  NA 139 138 139  K 4.1 4.4 4.2  CL 96* 96* 99  CO2 23 24 21*  GLUCOSE 201* 181* 187*  BUN 126* 93* 144*  CREATININE 8.14* 6.29* 8.55*  CALCIUM 7.6* 8.5* 8.3*  PHOS 6.0* 7.8* 8.5*   Recent Labs  Lab 08/04/19 0430 08/05/19 0309 08/06/19 0327  08/08/19 0114 08/09/19 0305 08/10/19 0310  WBC 8.1 9.9 8.8   < > 15.9* 17.3* 18.5*  NEUTROABS 6.3 7.8* 7.7  --   --   --   --   HGB 7.4* 8.8* 7.5*   < > 8.7* 9.0* 9.3*  HCT 24.1* 29.5* 25.0*   < > 28.3* 28.9* 30.2*  MCV 92.7 95.2 94.0   < > 92.5 92.0 91.0  PLT 284 332 290   < >  372 348 363   < > = values in this interval not displayed.

## 2019-08-10 NOTE — Progress Notes (Signed)
PULMONARY / CRITICAL CARE MEDICINE   NAME:  Larry Banks, MRN:  [id number], DOB:  10/16/1971, LOS: 9 ADMISSION DATE:  07/31/2019, CONSULTATION DATE:  07/27/2019  REFERRING MD:  EDP Dr. Tomi Bamberger   CHIEF COMPLAINT:  Cardiac Arrest/Resp Failure   BRIEF HISTORY:    48 year old male with medical history significant for hypertension, PAD, hyperlipidemia, CKD Stage 5 developed shortness of breath at home.  Patient does have CKD Stage 5 .  Is followed at [facility].  Recently had peritoneal dialysis catheter placed on August 27. Has not started Dialysis yet.  On arrival to the emergency room patient suffered a cardiac arrest with asystole.  He received CPR x15 minutes.  He receieved hyperkalemic protocol due to his underlying chronic kidney disease with 1 amp bicarb, 1 amp D50, insulin 10 units regular IV.  He received epi x3.  He had ROSC after 15 minutes.  He did require very brief Neo-Synephrine for hypotension.  He had significant agitation was started on sedation with Versed and Fentanyl.    Patient was transported to [facility] ICU for [name] to admit.  Remains intubated without significant neurologic recovery. EEG and CT suggestive of severe anoxic brain injury  SIGNIFICANT PAST MEDICAL HISTORY   Hypertension, hyperlipidemia, PAD, chronic kidney disease stage V  SIGNIFICANT EVENTS:  8/26-  TTS HD - last 07/29/2019 [name] (old tunneled Rt HD cah) 8/27 - PD cath - for a planned change from iHD to PD 8/29- Cardiac arrest, CPR x15 minutes, epi x3. Per renal CXR- c/w pulmonary edema 8/31- 1 unit PRBC, paralyzed due to vent dysynchrony 9/1- off pressors 9/5 antibiotics restarted- fevers, leukocytosis, arrhythmias  STUDIES:   8/29 CT head  > no acute abnormality 8/29 CT abdomen pelvis > bilateral lower lobe consolidation with small pleural effusion.  Groundglass opacities lower portion of the lung.  Nonobstructive renal calculus, 8/29 Echo > ef 45-50%, hypokineses of the mid-inferior and  lateral wall 8/29 - EEG> diffuse slowing and generalized suppression suggestive of profound encephalopathy, no seizure activity 9/1 CT head>severe diffuse brain edema compatible with widespread hypoxic injury 9/2 EEG> suggestive ofprofound diffuse encephalopathy,could besecondary to anoxic/hypoxic brain injury.No seizures or epileptiform discharges were seen throughout the recording.  9/3 MRI brain>Diffuse hypoxic ischemic injury most pronounced affecting the cerebral hemispheric cortex but with some involvement also of the basal ganglia and thalami. 9/4 & 9/5: CXR- no opacities  CULTURES:  8/29 BC >>Negative  8/29 Sputum >> Rare GNR, GPC>> Normal Flora 8/29 COVID 19 >> NEG  MRSA surveillance negative  9/5 blood culture >> 9/5 urine culture >>  ANTIBIOTICS:  Unasyn 8/29 >9/1 Cefepime 9/5> vanc 9/5 >  LINES/TUBES:  Peritoneal cath 8/27 (PTA )  R HD cath ? >>  ------- 8/29 ETT >>  8/29 L IJ CVL> removed 9/4 8/29 A line>>8/30 Foley cath (replaced) 9/6>>  CONSULTANTS:  Nephrology  Neurology  SUBJECTIVE:  Febrile again overnight after being afebrile during the day yesterday. Sedation had to be turned back on during the afternoon for hypertension & tachypnea.  Failed SBT this morning due to apneas.  CONSTITUTIONAL: BP 126/82   Pulse 66   Temp 99.5 F (37.5 C)   Resp 18   Ht 5\' 6"  (1.676 m)   Wt 92.6 kg   SpO2 100%   BMI 32.95 kg/m   I/O last 3 completed shifts: In: 1462.5 [I.V.:559.2; NG/GT:903.3] Out: 1700 [Urine:1550; Stool:150]     Vent Mode: SIMV;PRVC;PSV FiO2 (%):  [30 %-40 %] 30 % Set  Rate:  [10 bmp] 10 bmp Vt Set:  [500 mL] 500 mL PEEP:  [2 cmH20-5 cmH20] 5 cmH20 Pressure Support:  [10 [id number] cmH20] 10 cmH20 Plateau Pressure:  [12 cmH20-16 cmH20] 16 cmH20  General: chronically ill appearing man laying in bed in NAD, intubated but off sedation. HENT: Normocephalic, atraumatic.  Mucous membranes moist with copious oral secretions.  Large amount of  thick but clear bilateral nasal secretions.  ET tube and OG tube in place.  Tongue less edematous. Eyes: Anicteric sclera.  Persistent scleral edema. CV: Tachycardic, irregular rhythm.  Sinus tachycardia with PACs on telemetry. Lungs: Rhonchi bilaterally with expiratory wheezing.  Accessory abdominal muscle use.  Tachypneic breathing above the vent.   ABD: Large protuberant abdomen, but soft and nontender.   GU: Foley catheter draining clear yellow urine EXT: No edema or wounds.  Intact distal pulses. Skin: No rashes.  No erythema or purulence around tunneled catheter tract or site.   Neuro: Examined off all sedation.  No response to verbal stimulation.  No withdrawal from trapezius squeeze.  No withdraw from nailbed pressure in any extremity.  Positive pupil, corneal, cough, gag reflexes.  No spontaneous eye opening.  RESOLVED PROBLEM LIST  NA ASSESSMENT AND PLAN   PEA out of hospital ready active arrest with acute hypoxic respiratory failure and anoxic brain injury.  Etiology of arrest unknown. Transitioned from HD to PD and had missed dialysis the day of admission, presenting K was WNL. S/p 36 degree TTM.  EEG without seizure activity but profound diffuse encephalopathy. -Echo will moderate hypokinesis of the mid-inferior and lateral wall, new since echo in 2019 -MRI with restricted diffusion throughout the entire cortex. P: -Appreciate neurology's assistance in his care.  His family has elected for a prolonged, but time limited chance for recovery.  Planning on trach likely Thursday.  -Continue ventilatory support with low tidal volume ventilation.  Goal plateau pressure less than 30. - VAP prevention protocol -Daily SAT and SBT as tolerated.  Not a candidate for extubation due to mental status and concern for airway obstruction from his tongue. -Neuro protective measures- avoid fevers, normocapnia, normal electrolytes. - Minimize sedation as much as possible to facilitate neuro  prognostication.  Avoiding anticholinergics to help with oral secretions due to concern for confounding his neuro exam.  Sepsis of unknown etiology.  No infiltrates on x-ray or sputum.  Remaining indwelling lines are long-term dialysis catheter and peritoneal catheter, both of which do not appear infected.  UA from 9/5 not concerning for infection. -Continue broad-spectrum antibiotics. -Continue to search for potential source for infection.  Nephrology will try dialysis through his line tomorrow, but if there is concerns of sluggish flow, they agree that removing the line and pursuing a line holiday makes sense despite the fact that his line looks good and his blood cultures remain negative. -Appreciate pharmacy's assistance with dosing of antibiotics. -Adding liver function studies to morning labs to determine if he potentially has a biliary source of infection  ESRD stage 5, urinary retention requiring frequent straight catheterization. Hyperphosphatemia. -receiving HD at [facility], TTS -Peritoneal Cath placed on 8/27 to replace old R IJ P: -HD tomorrow.  Discussed more aggressive dialysis to optimize his BUN level. -No urgent indication for dialysis.  Last HD on 9/5.  Angioedema of tongue, improving -etiology unclear, no signs of systemic edema, rash or allergic reaction, no ACEI, on ARB at home  P: -Continue to monitor  Anemia of chronic disease and iron deficiency-stable -s/p 1 unit  PRBCs 8/29 -iron saturation 3% P: - Avoiding iron infusions due to concern for angioedema of unknown etiology - Transfuse for hemoglobin less than 7 or active bleeding with hemodynamic instability - Trying to minimize blood draws  Hypertension-improved -Continue labetalol as needed - We will restart amlodipine 5 mg daily   Type 2 DM, uncontrolled, but improving since glargine started P: -Increase Lantus to 30 units daily  -Continue sliding scale insulin PRN -Goal blood glucose 140-180 while  admitted to the ICU  Secondary Hyperparathyroidism PTH 93 on 8/28 P: -Nepro tube feed  Diarrhea P: FMS -Continue to monitor output, and discontinue if liquid stools cease he still having -low suspicion for Cdiff as his stool has been unchanged for many days, but can consider if not other sources of infection are found   Best Practice / Goals of Care / Disposition.   Diet: Tube feeds at goal Pain/Anxiety/Delirium protocol (if indicated): fentanyl, midazolam prn, Precedex VAP protocol (if indicated): ordered DVT prophylaxis: heparin SQ GI prophylaxis: famotidine Glucose control: SSI, Lantus Mobility: Bed Code Status: Full Family Communication: Family to be updated later today- his wife is planning to visit today. Disposition: ICU  This patient is critically ill with multiple organ system failure which requires frequent high complexity decision making, assessment, support, evaluation, and titration of therapies. This was completed through the application of advanced monitoring technologies and extensive interpretation of multiple databases. During this encounter critical care time was devoted to patient care services described in this note for 35 minutes.  [name], DO 08/10/19 9:56 AM [facility]

## 2019-08-11 LAB — CBC
HCT: 30.7 % — ABNORMAL LOW (ref 39.0–52.0)
Hemoglobin: 9.5 g/dL — ABNORMAL LOW (ref 13.0–17.0)
MCH: 28.4 pg (ref 26.0–34.0)
MCHC: 30.9 g/dL (ref 30.0–36.0)
MCV: 91.9 fL (ref 80.0–100.0)
Platelets: 353 10*3/uL (ref 150–400)
RBC: 3.34 MIL/uL — ABNORMAL LOW (ref 4.22–5.81)
RDW: 14.3 % (ref 11.5–15.5)
WBC: 13.9 10*3/uL — ABNORMAL HIGH (ref 4.0–10.5)
nRBC: 0 % (ref 0.0–0.2)

## 2019-08-11 LAB — RENAL FUNCTION PANEL
Albumin: 2.4 g/dL — ABNORMAL LOW (ref 3.5–5.0)
Anion gap: 20 — ABNORMAL HIGH (ref 5–15)
BUN: 176 mg/dL — ABNORMAL HIGH (ref 6–20)
CO2: 20 mmol/L — ABNORMAL LOW (ref 22–32)
Calcium: 8.7 mg/dL — ABNORMAL LOW (ref 8.9–10.3)
Chloride: 96 mmol/L — ABNORMAL LOW (ref 98–111)
Creatinine, Ser: 9.72 mg/dL — ABNORMAL HIGH (ref 0.61–1.24)
GFR calc Af Amer: 7 mL/min — ABNORMAL LOW (ref >60)
GFR calc non Af Amer: 6 mL/min — ABNORMAL LOW (ref >60)
Glucose, Bld: 200 mg/dL — ABNORMAL HIGH (ref 70–99)
Phosphorus: 10.3 mg/dL — ABNORMAL HIGH (ref 2.5–4.6)
Potassium: 4 mmol/L (ref 3.5–5.1)
Sodium: 136 mmol/L (ref 135–145)

## 2019-08-11 LAB — GLUCOSE, CAPILLARY
Glucose-Capillary: 158 mg/dL — ABNORMAL HIGH (ref 70–99)
Glucose-Capillary: 160 mg/dL — ABNORMAL HIGH (ref 70–99)
Glucose-Capillary: 182 mg/dL — ABNORMAL HIGH (ref 70–99)
Glucose-Capillary: 184 mg/dL — ABNORMAL HIGH (ref 70–99)
Glucose-Capillary: 195 mg/dL — ABNORMAL HIGH (ref 70–99)
Glucose-Capillary: 256 mg/dL — ABNORMAL HIGH (ref 70–99)

## 2019-08-11 MED ORDER — FENTANYL CITRATE (PF) 100 MCG/2ML IJ SOLN
200.0000 ug | Freq: Once | INTRAMUSCULAR | Status: DC
  Filled 2019-08-11: qty 4

## 2019-08-11 MED ORDER — HEPARIN SODIUM (PORCINE) 1000 UNIT/ML DIALYSIS
20.0000 [IU]/kg | INTRAMUSCULAR | Status: DC | PRN
  Administered 2019-08-15: 2000 [IU] via INTRAVENOUS_CENTRAL

## 2019-08-11 MED ORDER — FENTANYL 2500MCG IN NS 250ML (10MCG/ML) PREMIX INFUSION
50.0000 ug/h | INTRAVENOUS | Status: DC
  Administered 2019-08-11: 50 ug/h via INTRAVENOUS
  Administered 2019-08-12 (×2): 150 ug/h via INTRAVENOUS
  Filled 2019-08-11 (×3): qty 250

## 2019-08-11 MED ORDER — LIDOCAINE-PRILOCAINE 2.5-2.5 % EX CREA
1.0000 "application " | TOPICAL_CREAM | CUTANEOUS | Status: DC | PRN
  Filled 2019-08-11: qty 5

## 2019-08-11 MED ORDER — SODIUM CHLORIDE 0.9 % IV SOLN: 100.0000 mL | INTRAVENOUS | Status: DC | PRN

## 2019-08-11 MED ORDER — PRO-STAT SUGAR FREE PO LIQD
60.0000 mL | Freq: Two times a day (BID) | ORAL | Status: AC
  Administered 2019-08-11 – 2019-08-19 (×16): 60 mL
  Filled 2019-08-11 (×17): qty 60

## 2019-08-11 MED ORDER — NEPRO/CARBSTEADY PO LIQD
1000.0000 mL | ORAL | Status: DC
  Administered 2019-08-11 – 2019-08-16 (×4): 1000 mL

## 2019-08-11 MED ORDER — ETOMIDATE 2 MG/ML IV SOLN
40.0000 mg | Freq: Once | INTRAVENOUS | Status: DC
  Filled 2019-08-11: qty 20

## 2019-08-11 MED ORDER — SODIUM CHLORIDE 0.9 % IV SOLN
1.0000 g | INTRAVENOUS | Status: DC
  Filled 2019-08-11: qty 1

## 2019-08-11 MED ORDER — FENTANYL BOLUS VIA INFUSION
50.0000 ug | INTRAVENOUS | Status: DC | PRN
  Administered 2019-08-11 – 2019-08-12 (×5): 50 ug via INTRAVENOUS
  Filled 2019-08-11: qty 50

## 2019-08-11 MED ORDER — LIDOCAINE HCL (PF) 1 % IJ SOLN: 5.0000 mL | INTRAMUSCULAR | Status: DC | PRN

## 2019-08-11 MED ORDER — HEPARIN SODIUM (PORCINE) 1000 UNIT/ML IJ SOLN
3.2000 mL | Freq: Once | INTRAMUSCULAR | Status: AC
  Administered 2019-08-11: 11:00:00 3200 [IU] via INTRAVENOUS

## 2019-08-11 MED ORDER — PROPOFOL 10 MG/ML IV BOLUS: 500.0000 mg | Freq: Once | INTRAVENOUS | Status: DC

## 2019-08-11 MED ORDER — VECURONIUM BROMIDE 10 MG IV SOLR
10.0000 mg | Freq: Once | INTRAVENOUS | Status: DC
  Filled 2019-08-11: qty 10

## 2019-08-11 MED ORDER — MIDAZOLAM HCL 2 MG/2ML IJ SOLN
5.0000 mg | Freq: Once | INTRAMUSCULAR | Status: DC
  Filled 2019-08-11: qty 6

## 2019-08-11 MED ORDER — HEPARIN SODIUM (PORCINE) 1000 UNIT/ML IJ SOLN
INTRAMUSCULAR | Status: AC
  Filled 2019-08-11: qty 4

## 2019-08-11 NOTE — Progress Notes (Signed)
Nutrition Follow-up  RD working remotely.  DOCUMENTATION CODES:   Obesity unspecified  INTERVENTION:   - Noted plan for trach on 9/10, recommend Cortrak placement on 9/11 to continue tube feeds  Tube feeding: - Nepro @ 35 ml/hr (840 ml/day) via OG tube - Pro-stat 60 ml BID  Tube feeding regimen provides 1912 kcal, 128 grams of protein, and 611 ml of H2O.   NUTRITION DIAGNOSIS:   Increased nutrient needs related to chronic illness (ESRD on dialysis) as evidenced by estimated needs.  Ongoing, being addressed via TF  GOAL:   Provide needs based on ASPEN/SCCM guidelines  Met via TF  MONITOR:   Vent status, TF tolerance, Labs, Weight trends, I & O's  REASON FOR ASSESSMENT:   Consult Enteral/tube feeding initiation and management  ASSESSMENT:   48 year old male with medical history significant for hypertension, PAD, hyperlipidemia, CKD-stage 5 presented to [facility] emergency room with respiratory distress/hypoxia. On arrival to ER suffered a cardiac arrest with asystole with ROSC in 15 minutes.  Weight is 2 lbs above admit weight. EDW: 92.6 kg.  Pt receiving HD today.  Per CCM, pt is not a candidate for extubation due to mental status and concern for airway obstruction from tongue.  OG tube remains in place, currently infusing TF.  Weight stable, up 2 lbs overall since admit. Per RN edema assessment, pt with non-pitting edema to BUE and BLE.  Spoke with RN who reports pt is tolerating TF without issue.  Current TF: Nepro @ 20 ml/hr, Pro-stat 60 ml TID  Patient is currently intubated on ventilator support MV: 8.3 L/min Temp (24hrs), Avg:97.9 F (36.6 C), Min:95.5 F (35.3 C), Max:99.1 F (37.3 C) BP (cuff): 122/84 MAP (cuff): 94  Medications reviewed and include: Phoslyra 1334 mg TID, Aranesp, Pepcid, SSI q 4 hours, Lantus 30 units daily, IV abx  Labs reviewed: phosphorus 10.3, hemglobin 9.5 CBG's: 158-204 x 24 hours  UOP: 1165 ml x 24 hours OGT  output: 2100 ml x 24 hours I/O's: -7.0 L since admit  Diet Order:   Diet Order            Diet NPO time specified  Diet effective now              EDUCATION NEEDS:   Not appropriate for education at this time  Skin:  Skin Assessment: Skin Integrity Issues: Other: pressure injury to tongue  Last BM:  08/10/19 rectal tube  Height:   Ht Readings from Last 1 Encounters:  08/03/19 '5\' 6"'$  (1.676 m)    Weight:   Wt Readings from Last 1 Encounters:  08/11/19 93.8 kg    Ideal Body Weight:  64.5 kg  BMI:  Body mass index is 33.38 kg/m.  Estimated Nutritional Needs:   Kcal:  1891 (PSU 2003b)  Protein:  129-140 grams  Fluid:  UOP + 1000 ml    Larry Face, MS, RD, LDN Inpatient Clinical Dietitian Pager: [phone] Weekend/After Hours: [phone]

## 2019-08-11 NOTE — Progress Notes (Signed)
Pharmacy Antibiotic Note  Larry Banks is a 48 y.o. male admitted on 07/11/2019 for PEA arrest, previously on Unasyn for aspiration pneumonia. This was stopped on 9/1 for tongue swelling. Restarted vancomycin and cefepime for fevers up to 102, and rising WBC. All repeat cultures have been no growth. WBC improved to 13.9, temp down to 98.2. Patient has HD every TTS. Dialysis session today just began around 7am.  Plan: Vancomycin 1,000 mg IV qHD, will order pre-HD level with next session on 9/10 Cefepime 2 g IV qHD Monitor repeat cultures, clinical status, and vancomycin levels as indicated  Height: 5\' 6"  (167.6 cm) Weight: 206 lb 12.7 oz (93.8 kg) IBW/kg (Calculated) : 63.8  Temp (24hrs), Avg:97.8 F (36.6 C), Min:95.5 F (35.3 C), Max:99.1 F (37.3 C)  Recent Labs  Lab 08/07/19 0324 08/07/19 1142 08/08/19 0114 08/09/19 0305 08/10/19 0310 08/10/19 0422 08/11/19 0657  WBC  --  12.9* 15.9* 17.3* 18.5*  --  13.9*  CREATININE 6.43*  --  8.14* 6.29*  --  8.55* 9.72*    Estimated Creatinine Clearance: 9.9 mL/min (A) (by C-G formula based on SCr of 9.72 mg/dL (H)).    Allergies  Allergen Reactions  . Shellfish Allergy Nausea And Vomiting    Antimicrobials this admission: Unasyn 8/29 >> 9/1 Vancomycin 9/5 >> Cefepime 9/5 >>  Dose adjustments this admission: None  Microbiology results: 8/29 Bcx - ngF 8/29 TA - normal flora 8/29 MRSA - neg 9/5 BCx - ngtd 9/5 UCx - ngF  Thank you for allowing pharmacy to be a part of this patient's care.  Vertis Kelch, PharmD PGY2 Cardiology Pharmacy Resident Phone [phone] 08/11/2019       9:02 AM  Please check [email or web address] for unit-specific pharmacist phone numbers

## 2019-08-11 NOTE — Progress Notes (Signed)
PULMONARY / CRITICAL CARE MEDICINE   NAME:  Larry Banks, MRN:  [id number], DOB:  09/05/1971, LOS: 38 ADMISSION DATE:  07/26/2019, CONSULTATION DATE:  07/21/2019  REFERRING MD:  EDP Dr. Tomi Bamberger   CHIEF COMPLAINT:  Cardiac Arrest/Resp Failure   BRIEF HISTORY:    48 year old male with medical history significant for hypertension, PAD, hyperlipidemia, CKD Stage 5 developed shortness of breath at home.  Patient does have CKD Stage 5 .  Is followed at [facility].  Recently had peritoneal dialysis catheter placed on August 27. Has not started Dialysis yet.  On arrival to the emergency room patient suffered a cardiac arrest with asystole.  He received CPR x15 minutes.  He receieved hyperkalemic protocol due to his underlying chronic kidney disease with 1 amp bicarb, 1 amp D50, insulin 10 units regular IV.  He received epi x3.  He had ROSC after 15 minutes.  He did require very brief Neo-Synephrine for hypotension.  He had significant agitation was started on sedation with Versed and Fentanyl.    Patient was transported to [facility] ICU for [name] to admit.  Remains intubated without significant neurologic recovery. EEG and CT suggestive of severe anoxic brain injury  SIGNIFICANT PAST MEDICAL HISTORY   Hypertension, hyperlipidemia, PAD, chronic kidney disease stage V  SIGNIFICANT EVENTS:  8/26-  TTS HD - last 07/29/2019 [name] (old tunneled Rt HD cah) 8/27 - PD cath - for a planned change from iHD to PD 8/29- Cardiac arrest, CPR x15 minutes, epi x3. Per renal CXR- c/w pulmonary edema 8/31- 1 unit PRBC, paralyzed due to vent dysynchrony 9/1- off pressors 9/5 antibiotics restarted- fevers, leukocytosis, arrhythmias  STUDIES:   8/29 CT head  > no acute abnormality 8/29 CT abdomen pelvis > bilateral lower lobe consolidation with small pleural effusion.  Groundglass opacities lower portion of the lung.  Nonobstructive renal calculus, 8/29 Echo > ef 45-50%, hypokineses of the mid-inferior and  lateral wall 8/29 - EEG> diffuse slowing and generalized suppression suggestive of profound encephalopathy, no seizure activity 9/1 CT head>severe diffuse brain edema compatible with widespread hypoxic injury 9/2 EEG> suggestive ofprofound diffuse encephalopathy,could besecondary to anoxic/hypoxic brain injury.No seizures or epileptiform discharges were seen throughout the recording.  9/3 MRI brain>Diffuse hypoxic ischemic injury most pronounced affecting the cerebral hemispheric cortex but with some involvement also of the basal ganglia and thalami. 9/4 & 9/5: CXR- no opacities  CULTURES:  8/29 BC >>Negative  8/29 Sputum >> Rare GNR, GPC>> Normal Flora 8/29 COVID 19 >> NEG  MRSA surveillance negative  9/5 blood culture >> 9/5 urine culture >> negative  ANTIBIOTICS:  Unasyn 8/29 >9/1 Cefepime 9/5> vanc 9/5 >  LINES/TUBES:  Peritoneal cath 8/27 (PTA )  R HD cath ? >>  ------- 8/29 ETT >>  8/29 L IJ CVL> 9/4 8/29 A line>>8/30 Foley cath (replaced) 9/6>>  CONSULTANTS:  Nephrology  Neurology  SUBJECTIVE:  Was on Precedex 1.6 overnight, currently weaned to off Undergoing hemodialysis this morning, goal volume removal 3 L No changes noted in neurological status overnight Currently tolerating SIMV, PS 10 with a spontaneous respiratory drive  CONSTITUTIONAL: BP 115/89   Pulse 86   Temp 98.1 F (36.7 C)   Resp (!) 21   Ht 5\' 6"  (1.676 m)   Wt 93.8 kg   SpO2 100%   BMI 33.38 kg/m   I/O last 3 completed shifts: In: 2264.1 [P.O.:490; I.V.:614.1; NG/GT:1160] Out: [id number] [Urine:1665; Emesis/NG output:2100; Stool:50]     Vent Mode: SIMV;PRVC;PSV FiO2 (%):  [  30 %] 30 % Set Rate:  [10 bmp] 10 bmp Vt Set:  [500 mL] 500 mL PEEP:  [5 cmH20] 5 cmH20 Pressure Support:  [10 cmH20] 10 cmH20 Plateau Pressure:  [11 cmH20-16 cmH20] 14 cmH20  General: Ill-appearing obese man, unresponsive HENT: Very large tongue, no oral secretions Eyes: Scleral edema present, no icterus CV:  Regular, distant, no murmur Lungs: Distant, no crackles, no wheezes ABD: Large, obese, somewhat protuberant but nondistended, positive bowel sounds GU: Foley catheter in place, scant urine present EXT: No significant edema Skin: No rash Neuro: Flexor posturing of both upper extremities and turned his neck slightly to pain.  No response to voice.  Nothing purposeful.  Does not open eyes, pupils equal, react.  Respiratory drive intact  RESOLVED PROBLEM LIST  NA ASSESSMENT AND PLAN   PEA out of hospital ready active arrest with acute hypoxic respiratory failure and anoxic brain injury.  Etiology of arrest unknown. Transitioned from HD to PD and had missed dialysis the day of admission, presenting K was WNL. S/p 36 degree TTM.  EEG without seizure activity but profound diffuse encephalopathy. -Echo will moderate hypokinesis of the mid-inferior and lateral wall, new since echo in 2019 -MRI with restricted diffusion throughout the entire cortex. P: - Okay to continue SIMV as maintenance vent mode.  Transition to PS as he can tolerate.  Mental status will dictate in part.  Not a candidate for extubation due to mental status and also enlarged tongue -Discussions have been undertaken with patient's wife regarding goals for care.  She is aware of poor prognosis for meaningful neurological recovery, has elected to support for an extended period to allow for any clinical change.  Tracheostomy tentatively planned for 9/10 with Dr. Nelda Marseille -Try transitioning continuous sedation to intermittent dosing on 9/8 -VAP prevention order set   Sepsis of unknown etiology.  No infiltrates on x-ray or sputum.  Remaining indwelling lines are long-term dialysis catheter and peritoneal catheter, both of which do not appear infected.  Urine culture 9/5 negative.  LFTs 9/7 reassuring -On empiric vancomycin, cefepime pending culture data -If fever continues, any hemodynamic instability, signs of progressive sepsis then will  need to reconsider with nephrology possible line holiday, line change.  Consider also peritoneal HD catheter although neither catheter appears to be infected on inspection  ESRD stage 5, urinary retention requiring frequent straight catheterization. Hyperphosphatemia. -receiving HD at [facility], TTS -Peritoneal Cath placed on 8/27 to replace old R IJ P: -HD today 9/8.  Appreciate nephrology management  Angioedema of tongue, improving -etiology unclear, no signs of systemic edema, rash or allergic reaction, no ACEI, on ARB at home P: -Following.  Anemia of chronic disease and iron deficiency-stable -s/p 1 unit PRBCs 8/29 -iron saturation 3% P: -Continue to avoid iron infusions due to concern for angioedema of unknown etiology -Transfuse for hemoglobin less than 7 or active bleeding with hemodynamic instability - Trying to minimize blood draws   Hypertension-improved -Tolerating amlodipine 5 -Labetalol as needed   Type 2 DM, uncontrolled, but improving since glargine started P: -Continue Lantus 30 units -Sliding-scale insulin, CBG as ordered  Secondary Hyperparathyroidism PTH 93 on 8/28 P: -Nepro tube feeding running  Diarrhea P: FMS -On tube feeding.  Low suspicion C. difficile but consider especially if no other source identified   Best Practice / Goals of Care / Disposition.   Diet: Tube feeds at goal Pain/Anxiety/Delirium protocol (if indicated): fentanyl, midazolam prn VAP protocol (if indicated): ordered DVT prophylaxis: heparin SQ GI prophylaxis:  famotidine Glucose control: SSI, Lantus Mobility: Bed Code Status: Full Family Communication: spoke with pt's wife by phone 9/8 Disposition: ICU   Independent CC time 67 minutes   Baltazar Apo, MD, PhD 08/11/2019, 8:55 AM [facility] [phone] or if no answer [phone]

## 2019-08-11 NOTE — Procedures (Signed)
I was present at this dialysis session. I have reviewed the session itself and made appropriate changes.   Goal UF 3 L, 2K bath, TDC with QB 400.  Some mild hypotension, might not achieve UF target.  BUN markedly elevated, follow posttreatment BUN.  Access pressures are stable.  Has recently placed PD catheter, will need to look into flushing it, especially based upon goals of care.  Micro data with no growth.  Filed Weights   08/09/19 0230 08/10/19 0100 08/11/19 0100  Weight: 94.4 kg 92.6 kg 93.8 kg    Recent Labs  Lab 08/11/19 0657  NA 136  K 4.0  CL 96*  CO2 20*  GLUCOSE 200*  BUN 176*  CREATININE 9.72*  CALCIUM 8.7*  PHOS 10.3*    Recent Labs  Lab 08/05/19 0309 08/06/19 0327  08/09/19 0305 08/10/19 0310 08/11/19 0657  WBC 9.9 8.8   < > 17.3* 18.5* 13.9*  NEUTROABS 7.8* 7.7  --   --   --   --   HGB 8.8* 7.5*   < > 9.0* 9.3* 9.5*  HCT 29.5* 25.0*   < > 28.9* 30.2* 30.7*  MCV 95.2 94.0   < > 92.0 91.0 91.9  PLT 332 290   < > 348 363 353   < > = values in this interval not displayed.    Scheduled Meds: . acetaminophen (TYLENOL) oral liquid 160 mg/5 mL  650 mg Per Tube Q6H  . amLODipine  5 mg Per Tube Daily  . calcium acetate (Phos Binder)  1,334 mg Per Tube TID WC  . chlorhexidine gluconate (MEDLINE KIT)  15 mL Mouth Rinse BID  . Chlorhexidine Gluconate Cloth  6 each Topical Daily  . darbepoetin (ARANESP) injection - DIALYSIS  60 mcg Intravenous Q Tue-HD  . famotidine  10 mg Per Tube BID  . feeding supplement (NEPRO CARB STEADY)  1,000 mL Per Tube Q24H  . feeding supplement (PRO-STAT SUGAR FREE 64)  60 mL Per Tube TID  . heparin      . heparin  3.2 mL Intravenous Once  . heparin  5,000 Units Subcutaneous Q8H  . insulin aspart  0-20 Units Subcutaneous Q4H  . insulin glargine  30 Units Subcutaneous Daily  . mouth rinse  15 mL Mouth Rinse 10 times per day  . sodium chloride flush  3 mL Intravenous Q12H   Continuous Infusions: . sodium chloride 250 mL  (08/09/19 1618)  . ceFEPime (MAXIPIME) IV Stopped (08/08/19 1406)  . vancomycin     PRN Meds:.sodium chloride, acetaminophen (TYLENOL) oral liquid 160 mg/5 mL, albuterol, alteplase, alteplase, fentaNYL (SUBLIMAZE) injection, heparin, heparin, heparin, labetalol, midazolam, ondansetron (ZOFRAN) IV, pentafluoroprop-tetrafluoroeth, pentafluoroprop-tetrafluoroeth, sodium chloride flush   Pearson Grippe  MD 08/11/2019, 9:14 AM

## 2019-08-11 NOTE — Plan of Care (Signed)
  Problem: Education: Goal: Knowledge of General Education information will improve Description: Including pain rating scale, medication(s)/side effects and non-pharmacologic comfort measures Outcome: Not Progressing   Problem: Health Behavior/Discharge Planning: Goal: Ability to manage health-related needs will improve Outcome: Not Progressing   Problem: Clinical Measurements: Goal: Ability to maintain clinical measurements within normal limits will improve Outcome: Not Progressing   Problem: Clinical Measurements: Goal: Diagnostic test results will improve Outcome: Not Progressing

## 2019-08-12 ENCOUNTER — Inpatient Hospital Stay (HOSPITAL_COMMUNITY): Payer: 59

## 2019-08-12 LAB — BASIC METABOLIC PANEL
Anion gap: 17 — ABNORMAL HIGH (ref 5–15)
BUN: 96 mg/dL — ABNORMAL HIGH (ref 6–20)
CO2: 23 mmol/L (ref 22–32)
Calcium: 9 mg/dL (ref 8.9–10.3)
Chloride: 94 mmol/L — ABNORMAL LOW (ref 98–111)
Creatinine, Ser: 6.86 mg/dL — ABNORMAL HIGH (ref 0.61–1.24)
GFR calc Af Amer: 10 mL/min — ABNORMAL LOW (ref >60)
GFR calc non Af Amer: 9 mL/min — ABNORMAL LOW (ref >60)
Glucose, Bld: 166 mg/dL — ABNORMAL HIGH (ref 70–99)
Potassium: 4.1 mmol/L (ref 3.5–5.1)
Sodium: 134 mmol/L — ABNORMAL LOW (ref 135–145)

## 2019-08-12 LAB — CBC
HCT: 32.1 % — ABNORMAL LOW (ref 39.0–52.0)
Hemoglobin: 9.9 g/dL — ABNORMAL LOW (ref 13.0–17.0)
MCH: 28 pg (ref 26.0–34.0)
MCHC: 30.8 g/dL (ref 30.0–36.0)
MCV: 90.7 fL (ref 80.0–100.0)
Platelets: 418 10*3/uL — ABNORMAL HIGH (ref 150–400)
RBC: 3.54 MIL/uL — ABNORMAL LOW (ref 4.22–5.81)
RDW: 14.8 % (ref 11.5–15.5)
WBC: 17.3 10*3/uL — ABNORMAL HIGH (ref 4.0–10.5)
nRBC: 0 % (ref 0.0–0.2)

## 2019-08-12 LAB — GLUCOSE, CAPILLARY
Glucose-Capillary: 147 mg/dL — ABNORMAL HIGH (ref 70–99)
Glucose-Capillary: 221 mg/dL — ABNORMAL HIGH (ref 70–99)
Glucose-Capillary: 228 mg/dL — ABNORMAL HIGH (ref 70–99)
Glucose-Capillary: 154 mg/dL — ABNORMAL HIGH (ref 70–99)
Glucose-Capillary: 156 mg/dL — ABNORMAL HIGH (ref 70–99)

## 2019-08-12 LAB — PHOSPHORUS: Phosphorus: 7.1 mg/dL — ABNORMAL HIGH (ref 2.5–4.6)

## 2019-08-12 LAB — MAGNESIUM: Magnesium: 2.8 mg/dL — ABNORMAL HIGH (ref 1.7–2.4)

## 2019-08-12 MED ORDER — GERHARDT'S BUTT CREAM
TOPICAL_CREAM | Freq: Three times a day (TID) | CUTANEOUS | Status: DC | PRN
  Filled 2019-08-12: qty 1

## 2019-08-12 MED ORDER — SODIUM CHLORIDE 0.9 % IV SOLN
1.0000 g | INTRAVENOUS | Status: AC
  Administered 2019-08-12 – 2019-08-14 (×3): 1 g via INTRAVENOUS
  Filled 2019-08-12 (×3): qty 1

## 2019-08-12 NOTE — Progress Notes (Signed)
Palliative:  Full note to follow. I have examined "Larry Banks" and spoken with RN. I spoke with wife, [name]. She is clear about their desire for full aggressive care and to proceed with tracheostomy. She became frustrated during our conversation when discussing medical concerns and barriers to improvement. The rest of the conversation I tried to focus on providing support and building rapport. They are a family of great faith and are relying on their faith at this time that he will have improvement. They are not ready to consider any other options at this time. All questions/concerns addressed. Emotional support provided.   Vinie Sill, NP Palliative Medicine Team Pager [phone] (Please see [email or web address] for schedule) Team Phone [phone]

## 2019-08-12 NOTE — Plan of Care (Signed)
  Problem: Clinical Measurements: Goal: Ability to maintain clinical measurements within normal limits will improve Outcome: Progressing Goal: Will remain free from infection Outcome: Progressing Goal: Cardiovascular complication will be avoided Outcome: Progressing   Problem: Nutrition: Goal: Adequate nutrition will be maintained Outcome: Progressing   Problem: Activity: Goal: Ability to tolerate increased activity will improve Outcome: Progressing   Problem: Respiratory: Goal: Ability to maintain a clear airway and adequate ventilation will improve Outcome: Progressing   Problem: Education: Goal: Knowledge of General Education information will improve Description: Including pain rating scale, medication(s)/side effects and non-pharmacologic comfort measures Outcome: Not Progressing   Problem: Health Behavior/Discharge Planning: Goal: Ability to manage health-related needs will improve Outcome: Not Progressing   Problem: Clinical Measurements: Goal: Respiratory complications will improve Outcome: Not Progressing   Problem: Activity: Goal: Risk for activity intolerance will decrease Outcome: Not Progressing   Problem: Role Relationship: Goal: Method of communication will improve Outcome: Not Progressing

## 2019-08-12 NOTE — Progress Notes (Signed)
PULMONARY / CRITICAL CARE MEDICINE   NAME:  Larry Banks, MRN:  [id number], DOB:  [birth year], LOS: 54 ADMISSION DATE:  07/31/2019, CONSULTATION DATE:  07/25/2019  REFERRING MD:  EDP Dr. Tomi Bamberger   CHIEF COMPLAINT:  Cardiac Arrest/Resp Failure   BRIEF HISTORY:    48 year old male with medical history significant for hypertension, PAD, hyperlipidemia, CKD Stage 5 developed shortness of breath at home.  Patient does have CKD Stage 5 .  Is followed at [facility].  Recently had peritoneal dialysis catheter placed on August 27. Has not started Dialysis yet.  On arrival to the emergency room patient suffered a cardiac arrest with asystole.  He received CPR x15 minutes.  He receieved hyperkalemic protocol due to his underlying chronic kidney disease with 1 amp bicarb, 1 amp D50, insulin 10 units regular IV.  He received epi x3.  He had ROSC after 15 minutes.  He did require very brief Neo-Synephrine for hypotension.  He had significant agitation was started on sedation with Versed and Fentanyl.    Patient was transported to [facility] ICU for [name] to admit.  Remains intubated without significant neurologic recovery. EEG and CT suggestive of severe anoxic brain injury  SIGNIFICANT PAST MEDICAL HISTORY   Hypertension, hyperlipidemia, PAD, chronic kidney disease stage V  SIGNIFICANT EVENTS:  8/26-  TTS HD - last 07/29/2019 [name] (old tunneled Rt HD cah) 8/27 - PD cath - for a planned change from iHD to PD 8/29- Cardiac arrest, CPR x15 minutes, epi x3. Per renal CXR- c/w pulmonary edema 8/31- 1 unit PRBC, paralyzed due to vent dysynchrony 9/1- off pressors 9/5 antibiotics restarted- fevers, leukocytosis, arrhythmias  STUDIES:   8/29 CT head  > no acute abnormality 8/29 CT abdomen pelvis > bilateral lower lobe consolidation with small pleural effusion.  Groundglass opacities lower portion of the lung.  Nonobstructive renal calculus, 8/29 Echo > ef 45-50%, hypokineses of the mid-inferior and  lateral wall 8/29 - EEG> diffuse slowing and generalized suppression suggestive of profound encephalopathy, no seizure activity 9/1 CT head>severe diffuse brain edema compatible with widespread hypoxic injury 9/2 EEG> suggestive ofprofound diffuse encephalopathy,could besecondary to anoxic/hypoxic brain injury.No seizures or epileptiform discharges were seen throughout the recording.  9/3 MRI brain>Diffuse hypoxic ischemic injury most pronounced affecting the cerebral hemispheric cortex but with some involvement also of the basal ganglia and thalami. 9/4 & 9/5: CXR- no opacities  CULTURES:  8/29 BC >>Negative  8/29 Sputum >> Rare GNR, GPC>> Normal Flora 8/29 COVID 19 >> NEG  MRSA surveillance negative  9/5 blood culture >> 9/5 urine culture >> negative  ANTIBIOTICS:  Unasyn 8/29 >9/1 Cefepime 9/5> vanc 9/5 >  LINES/TUBES:  Peritoneal cath 8/27 (PTA )  R HD cath ? >>  ------- 8/29 ETT >>  8/29 L IJ CVL> 9/4 8/29 A line>>8/30 Foley cath (replaced) 9/6>>  CONSULTANTS:  Nephrology  Neurology  SUBJECTIVE:  Precedex is off, using intermittent sedation Remains on SIMV, PS 10 No significant changes overnight  CONSTITUTIONAL: BP (!) 139/92 (BP Location: Right Arm)   Pulse (!) 103   Temp 98.4 F (36.9 C) (Esophageal)   Resp 10   Ht 5\' 6"  (1.676 m)   Wt 90.3 kg   SpO2 100%   BMI 32.13 kg/m   I/O last 3 completed shifts: In: 2299.1 [P.O.:250; I.V.:509.5; NG/GT:1254.7; IV Piggyback:285] Out: 6550.1 [Urine:1040; Emesis/NG output:2100; Other:3300; Stool:110.1]     Vent Mode: SIMV;PSV;PRVC FiO2 (%):  [30 %] 30 % Set Rate:  [10 bmp] 10  bmp Vt Set:  [500 mL] 500 mL PEEP:  [5 cmH20] 5 cmH20 Pressure Support:  [10 cmH20] 10 cmH20 Plateau Pressure:  [9 cmH20-14 cmH20] 9 cmH20  General: Obese, ill-appearing man, ventilated HENT: Large tongue with some crusted material, ET tube in place Eyes: Opens eyes, pupils equal, right scleral hemorrhage and scleral edema CV:  Regular, distant, no murmur Lungs: Distant, no wheeze, no crackles ABD: Obese, protuberant, nondistended, positive bowel sounds GU: Foley catheter in place, scant urine present EXT: No edema Skin: No rash Neuro: Opens eyes to voice today, pupils equal, react.  Did not appear to track.  Flexes bilateral upper extremities to pain.  Spontaneously breathing.  Nothing purposeful.  Does not follow commands  RESOLVED PROBLEM LIST  NA ASSESSMENT AND PLAN   PEA out of hospital ready active arrest with acute hypoxic respiratory failure and anoxic brain injury.  Etiology of arrest unknown. Transitioned from HD to PD and had missed dialysis the day of admission, presenting K was WNL. S/p 36 degree TTM.  EEG without seizure activity but profound diffuse encephalopathy. -Echo will moderate hypokinesis of the mid-inferior and lateral wall, new since echo in 2019 -MRI with restricted diffusion throughout the entire cortex. P: -Continue SIMV as maintenance vent mode.  He may progress, come off mechanical ventilation once tracheostomy performed -Goals of care discussions have been undertaken with patient's wife who has indicated that she favors and extended period of support.  She also notified nursing that she is interested in speaking with palliative care service.  Tracheostomy is tentatively planned for 9/10 with Dr. Nelda Marseille. -VAP prevention order set   Sepsis of unknown etiology.  No infiltrates on x-ray or sputum.  Remaining indwelling lines are long-term dialysis catheter and peritoneal catheter, both of which do not appear infected.  Urine culture 9/5 negative.  LFTs 9/7 reassuring -Day 5 of empiric vancomycin, cefepime.  Culture data negative so far.  Plan to stop vancomycin today 9/9, complete 7 days cefepime and then stop. -If recurrent fever, hemodynamic instability, signs progressive sepsis then need to reconsider line change  ESRD stage 5, urinary retention requiring frequent straight  catheterization. Hyperphosphatemia. -receiving HD at [facility], TTS -Peritoneal Cath placed on 8/27 to replace old R IJ P: -Appreciate nephrology management, HD per their schedule  Angioedema of tongue, improving -etiology unclear, no signs of systemic edema, rash or allergic reaction, no ACEI, on ARB at home P: -Following  Anemia of chronic disease and iron deficiency-stable -s/p 1 unit PRBCs 8/29 -iron saturation 3% P: -Continue to avoid iron infusions due to concern for angioedema of unknown etiology -Transfuse for hemoglobin less than 7 or active bleeding with hemodynamic instability - Trying to minimize blood draws   Hypertension-improved -Continue amlodipine 5 -Labetalol as needed   Type 2 DM, uncontrolled, but improving since glargine started P: -Continue Lantus 30 units -Sliding-scale insulin, CBG as per protocol  Secondary Hyperparathyroidism PTH 93 on 8/28 P: -Nepro tube feeding running  Diarrhea P: FMS -On tube feeding.  Low suspicion C. difficile but consider especially if no other source identified   Best Practice / Goals of Care / Disposition.   Diet: Tube feeds at goal Pain/Anxiety/Delirium protocol (if indicated): fentanyl, midazolam prn VAP protocol (if indicated): ordered DVT prophylaxis: heparin SQ GI prophylaxis: famotidine Glucose control: SSI, Lantus Mobility: Bed Code Status: Full Family Communication: spoke with pt's wife by phone 9/8 Disposition: ICU   Independent CC time 31 minutes   Baltazar Apo, MD, PhD 08/12/2019, 8:47 AM [place]  [facility] [phone] or if no answer [phone]

## 2019-08-12 NOTE — Consult Note (Signed)
Consultation Note Date: 08/12/2019   Patient Name: Larry Banks  DOB: [birth year]  MRN: [id number]  Age / Sex: 48 y.o., male  PCP: Pleas Koch Virgina Evener, MD Referring Physician: Collene Gobble, MD  Reason for Consultation: Establishing goals of care and Psychosocial/spiritual support  HPI/Patient Profile: 48 y.o. male  with past medical history of PAD, HLD, ESRD began HD 2 weeks prior to admission with plan to transition to peritoneal dialysis, peritoneal catheter placed 8/27 admitted on 07/04/2019 with SOB and PEA arrest en-route to ED with ROSC after 15 min down. Continues vent dependent and tolerating HD. Hospitalization complicated further by fevers/sepsis and angioedema of unknown etiology. Unfortunately MRI brain and neurological assessment concerning for extensive brain injury with poor prognosis for significant recovery.   Clinical Assessment and Goals of Care: I met today at "Larry Banks's" bedside but no family at bedside. Extensive review of electronic records and discussed with RN. Larry Banks opens his eyes but this does not appear to be to command. He does not move extremities but did seem to have some withdrawal to leg to touch of feet. No consistent response to pain stimuli and no withdrawal. He does have some reflexes intact but does not appear to have any purposeful movements or actions during my visit.   I called and spoke with wife, Larry Banks. Larry Banks is at home but willing to speak with me. She has good understanding of her husband's condition upon review of hospitalization and events. She became frustrated when I discuss neurological concern for brain function and ability to recover. During the conversation it is clear that they are still very hopeful for improvement. She feels that she gets purposeful responses from him when she visits. She shares that she was critically ill and hospitalized at [facility] for over 2 months with feeding tube but has recovered to a fair QOL. She says she was told this was a miracle and so she knows that miracles can happen. She tells me that they are a family of great faith and continue to pray for healing. She has good support and says that all of the family have had conversations and everyone continues to hope for improvement and desires aggressive care. She also tells me that she has many close friends in healthcare (RN, OT, etc) that she consults as well and she feels they would tell her if what they are doing does not make sense.   Goals are clear for desire for tracheostomy and aggressive care. I reassured Larry Banks that palliative care will continue to help support Larry Banks and her family to make sure that we are all on the same page for our goals for Larry Banks. She was appreciative of the support. She requests that I speak with their daughter Larry Banks [phone]) who I will reach out to tomorrow as she has had many questions. They have 3 children together and Larry Banks has 1 son from a previous relationship (all adult children youngest 48 yo).   Primary Decision Maker NEXT OF KIN wife Larry Banks  SUMMARY OF RECOMMENDATIONS   - Full aggressive care - They are very spiritual and have had experience with critical and prolonged illness in immediate family that resulted in recovery so this is their context of expectations  Code Status/Advance Care Planning:  Full code   Symptom Management:   Per [name], renal  Palliative Prophylaxis:   Aspiration, Bowel Regimen, Delirium Protocol, Eye Care, Frequent Pain Assessment, Oral Care and Turn Reposition  Additional Recommendations (Limitations, Scope, Preferences):  Full Scope Treatment  Psycho-social/Spiritual:   Desire for further Chaplaincy support:yes  Additional Recommendations: Grief/Bereavement Support  Prognosis:   Prognosis very poor but dependent on aggressiveness of care.   Discharge  Planning: To Be Determined      Primary Diagnoses: Present on Admission:  Cardiac arrest [facility])   I have reviewed the medical record, interviewed the patient and family, and examined the patient. The following aspects are pertinent.  Past Medical History:  Diagnosis Date   Diabetes mellitus ([place])    ESRD (end stage renal disease) on dialysis ([place])    HLD (hyperlipidemia)    HTN (hypertension)    PAD (peripheral artery disease) ([facility])    Social History   Socioeconomic History   Marital status: Married    Spouse name: Not on file   Number of children: Not on file   Years of education: Not on file   Highest education level: Not on file  Occupational History   Not on file  Social Needs   Financial resource strain: Not on file   Food insecurity    Worry: Not on file    Inability: Not on file   Transportation needs    Medical: Not on file    Non-medical: Not on file  Tobacco Use   Smoking status: Never Smoker   Smokeless tobacco: Never Used  Substance and Sexual Activity   Alcohol use: Not on file   Drug use: Not on file   Sexual activity: Not on file  Lifestyle   Physical activity    Days per week: Not on file    Minutes per session: Not on file   Stress: Not on file  Relationships   Social connections    Talks on phone: Not on file    Gets together: Not on file    Attends religious service: Not on file    Active member of club or organization: Not on file    Attends meetings of clubs or organizations: Not on file    Relationship status: Not on file  Other Topics Concern   Not on file  Social History Narrative   Not on file   History reviewed. No pertinent family history. Scheduled Meds:  acetaminophen (TYLENOL) oral liquid 160 mg/5 mL  650 mg Per Tube Q6H   amLODipine  5 mg Per Tube Daily   calcium acetate (Phos Binder)  1,334 mg Per Tube TID WC   chlorhexidine gluconate (MEDLINE KIT)  15 mL Mouth Rinse BID   Chlorhexidine  Gluconate Cloth  6 each Topical Daily   darbepoetin (ARANESP) injection - DIALYSIS  60 mcg Intravenous Q Tue-HD   [START ON 08/13/2019] etomidate  40 mg Intravenous Once   famotidine  10 mg Per Tube BID   feeding supplement (NEPRO CARB STEADY)  1,000 mL Per Tube Q24H   feeding supplement (PRO-STAT SUGAR FREE 64)  60 mL Per Tube BID   [START ON 08/13/2019] fentaNYL (SUBLIMAZE) injection  200 mcg Intravenous Once   heparin  5,000 Units Subcutaneous Q8H  insulin aspart  0-20 Units Subcutaneous Q4H   insulin glargine  30 Units Subcutaneous Daily   mouth rinse  15 mL Mouth Rinse 10 times per day   [START ON 08/13/2019] midazolam  5 mg Intravenous Once   [START ON 08/13/2019] propofol  500 mg Intravenous Once   sodium chloride flush  3 mL Intravenous Q12H   [START ON 08/13/2019] vecuronium  10 mg Intravenous Once   Continuous Infusions:  sodium chloride 250 mL (08/09/19 1618)   sodium chloride     sodium chloride     ceFEPime (MAXIPIME) IV 200 mL/hr at 08/12/19 1200   fentaNYL infusion INTRAVENOUS 100 mcg/hr (08/12/19 1200)   PRN Meds:.sodium chloride, sodium chloride, sodium chloride, acetaminophen (TYLENOL) oral liquid 160 mg/5 mL, albuterol, alteplase, alteplase, fentaNYL, fentaNYL (SUBLIMAZE) injection, Gerhardt's butt cream, heparin, heparin, heparin, heparin, labetalol, lidocaine (PF), lidocaine-prilocaine, midazolam, ondansetron (ZOFRAN) IV, pentafluoroprop-tetrafluoroeth, pentafluoroprop-tetrafluoroeth, sodium chloride flush Allergies  Allergen Reactions   Shellfish Allergy Nausea And Vomiting   Review of Systems  Unable to perform ROS: Acuity of condition    Physical Exam Vitals signs and nursing note reviewed.  Constitutional:      General: He is awake. He is not in acute distress.    Appearance: He is ill-appearing.     Interventions: He is intubated.  Cardiovascular:     Rate and Rhythm: Tachycardia present.  Pulmonary:     Effort: No tachypnea,  accessory muscle usage or respiratory distress. He is intubated.     Breath sounds: Decreased breath sounds present.  Abdominal:     General: There is distension.     Comments: flexiseal in place  Neurological:     Comments: Eye opening spontaneously, not following commands, no purposeful movements noted on my exam     Vital Signs: BP 119/84 (BP Location: Right Arm)    Pulse (!) 102    Temp 98.4 F (36.9 C) (Esophageal)    Resp 10    Ht '5\' 6"'$  (1.676 m)    Wt 90.3 kg    SpO2 99%    BMI 32.13 kg/m  Pain Scale: CPOT   Pain Score: 2    SpO2: SpO2: 99 % O2 Device:SpO2: 99 % O2 Flow Rate: .   IO: Intake/output summary:   Intake/Output Summary (Last 24 hours) at 08/12/2019 1452 Last data filed at 08/12/2019 1400 Gross per 24 hour  Intake 1300.47 ml  Output 1220.1 ml  Net 80.37 ml    LBM: Last BM Date: 08/12/19 Baseline Weight: Weight: 92.6 kg Most recent weight: Weight: 90.3 kg     Palliative Assessment/Data:     Time In: 1430 Time Out: 1530 Time Total: 60 min Greater than 50%  of this time was spent counseling and coordinating care related to the above assessment and plan.  Signed by: Vinie Sill, NP Palliative Medicine Team Pager # [phone] (M-F 8a-5p) Team Phone # [phone] (Nights/Weekends)

## 2019-08-12 NOTE — Progress Notes (Signed)
Assisted tele visit to patient with wife.  Annemarie Sebree P, RN  

## 2019-08-12 NOTE — Progress Notes (Signed)
Assisted tele visit to patient with wife.  Valari Taylor William, RN  

## 2019-08-12 NOTE — Progress Notes (Signed)
Admit: 07/07/2019 LOS: 11  41M ESRD (TDC THS via RIJ TDC; also s/p recent PD cath 8/27) s/p cardiac arrest 07/20/2019  Subjective:  . HD yesterday: 2.5 L ultrafiltration, tolerated well . No change in neurological status . 9/4 blood cultures no growth to date  09/08 0701 - 09/09 0700 In: 1319.3 [I.V.:269.6; NG/GT:764.7; IV Piggyback:285] Out: 3660.1 [Urine:250; Stool:110.1]  Filed Weights   08/10/19 0100 08/11/19 0100 08/12/19 0400  Weight: 92.6 kg 93.8 kg 90.3 kg    Scheduled Meds: . acetaminophen (TYLENOL) oral liquid 160 mg/5 mL  650 mg Per Tube Q6H  . amLODipine  5 mg Per Tube Daily  . calcium acetate (Phos Binder)  1,334 mg Per Tube TID WC  . chlorhexidine gluconate (MEDLINE KIT)  15 mL Mouth Rinse BID  . Chlorhexidine Gluconate Cloth  6 each Topical Daily  . darbepoetin (ARANESP) injection - DIALYSIS  60 mcg Intravenous Q Tue-HD  . [START ON 08/13/2019] etomidate  40 mg Intravenous Once  . famotidine  10 mg Per Tube BID  . feeding supplement (NEPRO CARB STEADY)  1,000 mL Per Tube Q24H  . feeding supplement (PRO-STAT SUGAR FREE 64)  60 mL Per Tube BID  . [START ON 08/13/2019] fentaNYL (SUBLIMAZE) injection  200 mcg Intravenous Once  . heparin  5,000 Units Subcutaneous Q8H  . insulin aspart  0-20 Units Subcutaneous Q4H  . insulin glargine  30 Units Subcutaneous Daily  . mouth rinse  15 mL Mouth Rinse 10 times per day  . [START ON 08/13/2019] midazolam  5 mg Intravenous Once  . [START ON 08/13/2019] propofol  500 mg Intravenous Once  . sodium chloride flush  3 mL Intravenous Q12H  . [START ON 08/13/2019] vecuronium  10 mg Intravenous Once   Continuous Infusions: . sodium chloride 250 mL (08/09/19 1618)  . sodium chloride    . sodium chloride    . ceFEPime (MAXIPIME) IV    . fentaNYL infusion INTRAVENOUS 150 mcg/hr (08/12/19 0700)   PRN Meds:.sodium chloride, sodium chloride, sodium chloride, acetaminophen (TYLENOL) oral liquid 160 mg/5 mL, albuterol, alteplase, alteplase,  fentaNYL, fentaNYL (SUBLIMAZE) injection, heparin, heparin, heparin, heparin, labetalol, lidocaine (PF), lidocaine-prilocaine, midazolam, ondansetron (ZOFRAN) IV, pentafluoroprop-tetrafluoroeth, pentafluoroprop-tetrafluoroeth, sodium chloride flush  Current Labs: reviewed    Physical Exam:  Blood pressure 132/89, pulse (!) 104, temperature 98.4 F (36.9 C), resp. rate 10, height '5\' 6"'$  (1.676 m), weight 90.3 kg, SpO2 100 %. Intubated Not responsive RRR nl s1s2 No sig LEE R IJ TDC C/D/I, no erythema or purulent drainage Large protruding tongue  A 1. ESRD THS Via R IJ TDC with [facility] 2. S/p cardiac arrest 3. S/p PD cath placement 8/27 4. VDRF 5. ANoxic brain injury 6. Hyperphosphatemia on binders 7. Fevers/Leukocytsois, B Cx NGTD, CEfepime/Vanc; has defervesced   P . HD tomorrow, 4h, TDC 400/800, 2K, tight heparin, 2 to 3 L ultrafiltration separate . Medication Issues; o Preferred narcotic agents for pain control are hydromorphone, fentanyl, and methadone. Morphine should not be used.  o Baclofen should be avoided o Avoid oral sodium phosphate and magnesium citrate based laxatives / bowel preps    Pearson Grippe MD 08/12/2019, 9:56 AM  Recent Labs  Lab 08/10/19 0422 08/11/19 0657 08/12/19 0235  NA 139 136 134*  K 4.2 4.0 4.1  CL 99 96* 94*  CO2 21* 20* 23  GLUCOSE 187* 200* 166*  BUN 144* 176* 96*  CREATININE 8.55* 9.72* 6.86*  CALCIUM 8.3* 8.7* 9.0  PHOS 8.5* 10.3* 7.1*   Recent  Labs  Lab 08/06/19 0327  08/10/19 0310 08/11/19 0657 08/12/19 0235  WBC 8.8   < > 18.5* 13.9* 17.3*  NEUTROABS 7.7  --   --   --   --   HGB 7.5*   < > 9.3* 9.5* 9.9*  HCT 25.0*   < > 30.2* 30.7* 32.1*  MCV 94.0   < > 91.0 91.9 90.7  PLT 290   < > 363 353 418*   < > = values in this interval not displayed.

## 2019-08-12 NOTE — Progress Notes (Signed)
Assisted tele visit to patient with sister.  Larry Banks M [name], RN   

## 2019-08-13 DIAGNOSIS — Z992 Dependence on renal dialysis: Secondary | ICD-10-CM

## 2019-08-13 DIAGNOSIS — N186 End stage renal disease: Secondary | ICD-10-CM

## 2019-08-13 DIAGNOSIS — Z9911 Dependence on respirator [ventilator] status: Secondary | ICD-10-CM

## 2019-08-13 LAB — CBC
HCT: 30.2 % — ABNORMAL LOW (ref 39.0–52.0)
Hemoglobin: 9 g/dL — ABNORMAL LOW (ref 13.0–17.0)
MCH: 28.1 pg (ref 26.0–34.0)
MCHC: 29.8 g/dL — ABNORMAL LOW (ref 30.0–36.0)
MCV: 94.4 fL (ref 80.0–100.0)
Platelets: 357 10*3/uL (ref 150–400)
RBC: 3.2 MIL/uL — ABNORMAL LOW (ref 4.22–5.81)
RDW: 14.7 % (ref 11.5–15.5)
WBC: 15.6 10*3/uL — ABNORMAL HIGH (ref 4.0–10.5)
nRBC: 0 % (ref 0.0–0.2)

## 2019-08-13 LAB — MAGNESIUM: Magnesium: 3 mg/dL — ABNORMAL HIGH (ref 1.7–2.4)

## 2019-08-13 LAB — PHOSPHORUS: Phosphorus: 9.3 mg/dL — ABNORMAL HIGH (ref 2.5–4.6)

## 2019-08-13 LAB — BASIC METABOLIC PANEL
Anion gap: 22 — ABNORMAL HIGH (ref 5–15)
BUN: 136 mg/dL — ABNORMAL HIGH (ref 6–20)
CO2: 18 mmol/L — ABNORMAL LOW (ref 22–32)
Calcium: 9 mg/dL (ref 8.9–10.3)
Chloride: 97 mmol/L — ABNORMAL LOW (ref 98–111)
Creatinine, Ser: 8.29 mg/dL — ABNORMAL HIGH (ref 0.61–1.24)
GFR calc Af Amer: 8 mL/min — ABNORMAL LOW (ref >60)
GFR calc non Af Amer: 7 mL/min — ABNORMAL LOW (ref >60)
Glucose, Bld: 210 mg/dL — ABNORMAL HIGH (ref 70–99)
Potassium: 4.6 mmol/L (ref 3.5–5.1)
Sodium: 137 mmol/L (ref 135–145)

## 2019-08-13 LAB — GLUCOSE, CAPILLARY
Glucose-Capillary: 112 mg/dL — ABNORMAL HIGH (ref 70–99)
Glucose-Capillary: 114 mg/dL — ABNORMAL HIGH (ref 70–99)
Glucose-Capillary: 124 mg/dL — ABNORMAL HIGH (ref 70–99)
Glucose-Capillary: 138 mg/dL — ABNORMAL HIGH (ref 70–99)
Glucose-Capillary: 208 mg/dL — ABNORMAL HIGH (ref 70–99)
Glucose-Capillary: 259 mg/dL — ABNORMAL HIGH (ref 70–99)
Glucose-Capillary: 265 mg/dL — ABNORMAL HIGH (ref 70–99)

## 2019-08-13 LAB — PROTIME-INR
INR: 1 (ref 0.8–1.2)
Prothrombin Time: 13.5 seconds (ref 11.4–15.2)

## 2019-08-13 MED ORDER — PROPOFOL 10 MG/ML IV BOLUS
500.0000 mg | Freq: Once | INTRAVENOUS | Status: AC
  Administered 2019-08-13: 11:00:00 100 mg via INTRAVENOUS

## 2019-08-13 MED ORDER — INFLUENZA VAC SPLIT QUAD 0.5 ML IM SUSY: 0.5000 mL | PREFILLED_SYRINGE | INTRAMUSCULAR | Status: DC | PRN

## 2019-08-13 MED ORDER — FENTANYL CITRATE (PF) 100 MCG/2ML IJ SOLN
200.0000 ug | Freq: Once | INTRAMUSCULAR | Status: DC
  Filled 2019-08-13: qty 4

## 2019-08-13 MED ORDER — MIDAZOLAM HCL 2 MG/2ML IJ SOLN
5.0000 mg | Freq: Once | INTRAMUSCULAR | Status: AC
  Administered 2019-08-13: 11:00:00 5 mg via INTRAVENOUS

## 2019-08-13 MED ORDER — ETOMIDATE 2 MG/ML IV SOLN
40.0000 mg | Freq: Once | INTRAVENOUS | Status: AC
  Administered 2019-08-13: 11:00:00 20 mg via INTRAVENOUS

## 2019-08-13 MED ORDER — PROPOFOL 500 MG/50ML IV EMUL
INTRAVENOUS | Status: AC
  Filled 2019-08-13: qty 50

## 2019-08-13 MED ORDER — VECURONIUM BROMIDE 10 MG IV SOLR
10.0000 mg | Freq: Once | INTRAVENOUS | Status: AC
  Administered 2019-08-13: 10 mg via INTRAVENOUS

## 2019-08-13 MED ORDER — PROPOFOL 1000 MG/100ML IV EMUL
INTRAVENOUS | Status: AC
  Administered 2019-08-13: 11:00:00 5 ug/kg/min
  Filled 2019-08-13: qty 100

## 2019-08-13 MED ORDER — HEPARIN SODIUM (PORCINE) 1000 UNIT/ML DIALYSIS
20.0000 [IU]/kg | INTRAMUSCULAR | Status: DC | PRN
  Administered 2019-08-15: 2000 [IU] via INTRAVENOUS_CENTRAL

## 2019-08-13 NOTE — Progress Notes (Signed)
PD catheter exit site care and dressing change performed. No drainage. No s/sx infection.

## 2019-08-13 NOTE — Plan of Care (Signed)
  Problem: Clinical Measurements: Goal: Will remain free from infection Outcome: Progressing Goal: Diagnostic test results will improve Outcome: Progressing Goal: Cardiovascular complication will be avoided Outcome: Progressing   Problem: Education: Goal: Knowledge of General Education information will improve Description: Including pain rating scale, medication(s)/side effects and non-pharmacologic comfort measures Outcome: Not Progressing   Problem: Health Behavior/Discharge Planning: Goal: Ability to manage health-related needs will improve Outcome: Not Progressing   Problem: Clinical Measurements: Goal: Ability to maintain clinical measurements within normal limits will improve Outcome: Not Progressing Goal: Respiratory complications will improve Outcome: Not Progressing

## 2019-08-13 NOTE — Procedures (Signed)
Percutaneous Tracheostomy Placement  Consent from family.  Patient sedated, paralyzed and position.  Placed on 100% FiO2 and RR matched.  Area cleaned and draped.  Lidocaine/epi injected.  Skin incision done followed by blunt dissection.  Trachea palpated then punctured, catheter passed and visualized bronchoscopically.  Wire placed and visualized.  Catheter removed.  Airway then entered and dilated.  Size 6 cuffed shiley trach placed and visualized bronchoscopically well above carina.  Good volume returns.  Patient tolerated the procedure well without complications.  Minimal blood loss.  CXR ordered and pending.  Wesam G. Yacoub, M.D. [facility]. Pager: [phone]. After hours pager: [phone].  

## 2019-08-13 NOTE — Progress Notes (Signed)
Daily Progress Note   Patient Name: Larry Banks       Date: 08/13/2019 DOB: Aug 27, 1971  Age: 48 y.o. MRN#: [id number] Attending Physician: Larry Gobble, MD Primary Care Physician: Larry Labrum, MD Admit Date: 07/12/2019  Reason for Consultation/Follow-up: Establishing goals of care, emotional support  Subjective: On vent.   Length of Stay: 12  Current Medications: Scheduled Meds:  . amLODipine  5 mg Per Tube Daily  . calcium acetate (Phos Binder)  1,334 mg Per Tube TID WC  . chlorhexidine gluconate (MEDLINE KIT)  15 mL Mouth Rinse BID  . Chlorhexidine Gluconate Cloth  6 each Topical Daily  . darbepoetin (ARANESP) injection - DIALYSIS  60 mcg Intravenous Q Tue-HD  . famotidine  10 mg Per Tube BID  . feeding supplement (NEPRO CARB STEADY)  1,000 mL Per Tube Q24H  . feeding supplement (PRO-STAT SUGAR FREE 64)  60 mL Per Tube BID  . fentaNYL (SUBLIMAZE) injection  200 mcg Intravenous Once  . heparin  5,000 Units Subcutaneous Q8H  . insulin aspart  0-20 Units Subcutaneous Q4H  . insulin glargine  30 Units Subcutaneous Daily  . mouth rinse  15 mL Mouth Rinse 10 times per day  . sodium chloride flush  3 mL Intravenous Q12H    Continuous Infusions: . sodium chloride 250 mL (08/09/19 1618)  . sodium chloride    . sodium chloride    . ceFEPime (MAXIPIME) IV 1 g (08/13/19 1220)  . fentaNYL infusion INTRAVENOUS Stopped (08/13/19 1047)    PRN Meds: sodium chloride, sodium chloride, sodium chloride, acetaminophen (TYLENOL) oral liquid 160 mg/5 mL, albuterol, alteplase, alteplase, fentaNYL, fentaNYL (SUBLIMAZE) injection, Gerhardt's butt cream, heparin, heparin, heparin, heparin, heparin, labetalol, lidocaine (PF), lidocaine-prilocaine, midazolam, ondansetron (ZOFRAN) IV,  pentafluoroprop-tetrafluoroeth, pentafluoroprop-tetrafluoroeth, sodium chloride flush  Physical Exam       Visit completed remotely but discussed physical exam and any changes or concern with bedside RN.     Vital Signs: BP 122/85   Pulse 84   Temp 98.5 F (36.9 C) (Oral)   Resp 18   Ht '5\' 6"'$  (1.676 m)   Wt 91.7 kg   SpO2 100%   BMI 32.63 kg/m  SpO2: SpO2: 100 % O2 Device: O2 Device: Ventilator O2 Flow Rate:    Intake/output summary:   Intake/Output Summary (  Last 24 hours) at 08/13/2019 1442 Last data filed at 08/13/2019 1400 Gross per 24 hour  Intake 742.32 ml  Output 3395 ml  Net -2652.68 ml   LBM: Last BM Date: 08/13/19 Baseline Weight: Weight: 92.6 kg Most recent weight: Weight: 91.7 kg       Palliative Assessment/Data:      Patient Active Problem List   Diagnosis Date Noted  . Ventilator dependence ([place])   . ESRD needing dialysis ([place])   . Acute respiratory failure ([facility])   . Cardiac arrest ([place]) 07/31/2019  . Encounter for central line placement     Palliative Care Assessment & Plan   HPI: 48 y.o. male  with past medical history of PAD, HLD, ESRD began HD 2 weeks prior to admission with plan to transition to peritoneal dialysis, peritoneal catheter placed 8/27 admitted on 08/03/2019 with SOB and PEA arrest en-route to ED with ROSC after 15 min down. Continues vent dependent and tolerating HD. Hospitalization complicated further by fevers/sepsis and angioedema of unknown etiology. Unfortunately MRI brain and neurological assessment concerning for extensive brain injury with poor prognosis for significant recovery. Trach placed 9/10.   Assessment: I spoke today with daughter, Larry Banks ([phone]), per request from wife, Larry Banks, yesterday. I had a good conversation with Larry Banks and she had many good questions. Larry Banks asks about prognosis and expectations as she felt that she had heard this is grim from neurology. I confirmed that her father's neurological  prognosis is very poor and we are concerned that we will not see significant improvement. We discussed diagnostic results and neurological exam findings. She is more accepting of this information than her mother was yesterday and we were able to discuss the importance of a time trial and consideration of his QOL if he does not show improvement or further declines.   Larry Banks expresses concern that her mother does not understand how poor her father's prognosis really is. She feels that her family has not really been provided the big picture from her mother. I explained the difference between understanding information and expectations vs accepting this information. I explained that I feel that her mother needs time to process and see this for herself. I encouraged them to continue speaking as a family and that I would be happy to meet with them together to discuss further. I am not sure the wife would be open to this at this time and I told Larry Banks that we should re-evaluate early next week.   Larry Banks asks about counseling for herself and her family and I encouraged her to reach out to hospice as they may be able to provide grief counseling for them considering his critical illness.   Recommendations/Plan:  Continue GOC conversations and family support.   Goals of Care and Additional Recommendations:  Limitations on Scope of Treatment: Full Scope Treatment  Code Status:  Full code  Prognosis:   Dependent on aggressiveness of care.   Discharge Planning:  To Be Determined  Care plan was discussed with RN  Thank you for allowing the Palliative Medicine Team to assist in the care of this patient.   Total Time 35 min Prolonged Time Billed  no       Greater than 50%  of this time was spent counseling and coordinating care related to the above assessment and plan.  Larry Sill, NP Palliative Medicine Team Pager # [phone] (M-F 8a-5p) Team Phone # [phone] (Nights/Weekends)    The above conversation was completed via telephone due to the  visitor restrictions during the COVID-19 pandemic. Thorough chart review and discussion with necessary members of the care team was completed as part of assessment. All issues were discussed and addressed but no physical exam was performed.

## 2019-08-13 NOTE — Progress Notes (Signed)
Hemodialysis- Initiated at bedside in patient room. Patient currently resting quietly. Labs reviewed. Heparin held due to procedure today. Continue to monitor.

## 2019-08-13 NOTE — Progress Notes (Signed)
RT NOTE: Patient placed on PRVC mode at 100% FIO2 per MD for trach procedure. Vitals are stable. RT will continue to monitor.

## 2019-08-13 NOTE — Progress Notes (Signed)
RT NOTE: Patient placed back on previous settings in SIM[facility]) PS mode per MD Dr. Lamonte Sakai. Vitals are stable. RT will continue to monitor.

## 2019-08-13 NOTE — Procedures (Signed)
Bronchoscopy Procedure Note Larry Banks [id number] 1971/01/02  Procedure: Bronchoscopy Indications: Diagnostic evaluation of the airways  Procedure Details Consent: Risks of procedure as well as the alternatives and risks of each were explained to the (patient/caregiver).  Consent for procedure obtained. Time Out: Verified patient identification, verified procedure, site/side was marked, verified correct patient position, special equipment/implants available, medications/allergies/relevent history reviewed, required imaging and test results available.  Performed  In preparation for procedure, patient was given 100% FiO2 and bronchoscope lubricated. Sedation: Benzodiazepines, Muscle relaxants and Etomidate  Airway entered and the following bronchi were examined: Bronchi.   Procedures performed: Brushings performed Bronchoscope removed.  , Patient placed back on 100% FiO2 at conclusion of procedure.    Evaluation Hemodynamic Status: BP stable throughout; O2 sats: stable throughout Patient's Current Condition: stable Specimens:  None Complications: No apparent complications Patient did tolerate procedure well.   Larry Banks 08/13/2019

## 2019-08-13 NOTE — Progress Notes (Signed)
Assisted tele visit to patient with wife.  Maryelizabeth Rowan, RN

## 2019-08-13 NOTE — Progress Notes (Signed)
PULMONARY / CRITICAL CARE MEDICINE   NAME:  Larry Banks, MRN:  [id number], DOB:  10-Jul-1971, LOS: 12 ADMISSION DATE:  07/15/2019, CONSULTATION DATE:  07/15/2019  REFERRING MD:  EDP Dr. Tomi Banks   CHIEF COMPLAINT:  Cardiac Arrest/Resp Failure   BRIEF HISTORY:    48 year old male with medical history significant for hypertension, PAD, hyperlipidemia, CKD Stage 5 developed shortness of breath at home.  Patient does have CKD Stage 5 .  Is followed at [facility].  Recently had peritoneal dialysis catheter placed on August 27. Has not started Dialysis yet.  On arrival to the emergency room patient suffered a cardiac arrest with asystole.  He received CPR x15 minutes.  He receieved hyperkalemic protocol due to his underlying chronic kidney disease with 1 amp bicarb, 1 amp D50, insulin 10 units regular IV.  He received epi x3.  He had ROSC after 15 minutes.  He did require very brief Neo-Synephrine for hypotension.  He had significant agitation was started on sedation with Versed and Fentanyl.    Patient was transported to [facility] ICU for [name] to admit.  Remains intubated without significant neurologic recovery. EEG and CT suggestive of severe anoxic brain injury  SIGNIFICANT PAST MEDICAL HISTORY   Hypertension, hyperlipidemia, PAD, chronic kidney disease stage V  SIGNIFICANT EVENTS:  8/26-  TTS HD - last 07/29/2019 Larry Banks (old tunneled Rt HD cah) 8/27 - PD cath - for a planned change from iHD to PD 8/29- Cardiac arrest, CPR x15 minutes, epi x3. Per renal CXR- c/w pulmonary edema 8/31- 1 unit PRBC, paralyzed due to vent dysynchrony 9/1- off pressors 9/5 antibiotics restarted- fevers, leukocytosis, arrhythmias  STUDIES:   8/29 CT head  > no acute abnormality 8/29 CT abdomen pelvis > bilateral lower lobe consolidation with small pleural effusion.  Groundglass opacities lower portion of the lung.  Nonobstructive renal calculus, 8/29 Echo > ef 45-50%, hypokineses of the mid-inferior and  lateral wall 8/29 - EEG> diffuse slowing and generalized suppression suggestive of profound encephalopathy, no seizure activity 9/1 CT head>severe diffuse brain edema compatible with widespread hypoxic injury 9/2 EEG> suggestive ofprofound diffuse encephalopathy,could besecondary to anoxic/hypoxic brain injury.No seizures or epileptiform discharges were seen throughout the recording.  9/3 MRI brain>Diffuse hypoxic ischemic injury most pronounced affecting the cerebral hemispheric cortex but with some involvement also of the basal ganglia and thalami. 9/4 & 9/5: CXR- no opacities  CULTURES:  8/29 BC >>Negative  8/29 Sputum >> Rare GNR, GPC>> Normal Flora 8/29 COVID 19 >> NEG  MRSA surveillance negative  9/5 blood culture >> 9/5 urine culture >> negative  ANTIBIOTICS:  Unasyn 8/29 >9/1 Cefepime 9/5> vanc 9/5 >  LINES/TUBES:  Peritoneal cath 8/27 (PTA )  R HD cath ? >>  ------- 8/29 ETT >>  8/29 L IJ CVL> 9/4 8/29 A line>>8/30 Foley cath (replaced) 9/6>>  CONSULTANTS:  Nephrology  Neurology  SUBJECTIVE:  Undergoing hemodialysis currently.  Planning for 3 L removal if tolerated No significant neurological change; currently fentanyl 100   CONSTITUTIONAL: BP (!) 145/88   Pulse 67   Temp 97.9 F (36.6 C)   Resp 10   Ht 5\' 6"  (1.676 m)   Wt 94.7 kg   SpO2 100%   BMI 33.70 kg/m   I/O last 3 completed shifts: In: 1684.6 [I.V.:534.6; NG/GT:1050; IV Piggyback:100] Out: 755 [Urine:645; Stool:110]     Vent Mode: SIMV;PRVC;PSV FiO2 (%):  [30 %] 30 % Set Rate:  [10 bmp] 10 bmp Vt Set:  [500 mL]  500 mL PEEP:  [5 cmH20] 5 cmH20 Pressure Support:  [10 cmH20] 10 cmH20 Plateau Pressure:  [4 cmH20-11 cmH20] 4 cmH20  General: Obese, ill-appearing man, ventilated HENT: Large tongue, no apparent ulcers, lesions, ET tube in place Eyes: He will open his eyes, pupils equal, has a right scleral hemorrhage some scleral edema. CV: Regular, distant, no murmurs heard Lungs:  Distant bilateral breath sounds, no wheezes, decreased posterior laterally ABD: Obese, nondistended, bowel sounds present GU: Foley catheter in place, scant urine EXT: No significant edema Skin: No rashes Neuro: He will open his eyes to voice.  His pupils are equal and reactive.  He did not track.  Flexes his bilateral upper extremities to pain.  No purposeful movement.  Does not follow commands.  RESOLVED PROBLEM LIST  NA ASSESSMENT AND PLAN   PEA out of hospital ready active arrest with acute hypoxic respiratory failure and anoxic brain injury.  Etiology of arrest unknown. Transitioned from HD to PD and had missed dialysis the day of admission, presenting K was WNL. S/p 36 degree TTM.  EEG without seizure activity but profound diffuse encephalopathy. -Echo will moderate hypokinesis of the mid-inferior and lateral wall, new since echo in 2019 -MRI with restricted diffusion throughout the entire cortex. P: -Based on goals for care, plan for prolonged support for any evidence for neurological progress he will undergo tracheostomy.  Planned for today 9/10 with Larry Banks.  Appreciate palliative care consultation and assistance.  In conversation with the patient's wife have confirmed that this plan is consistent with the patient's wishes -Tolerating SIMV as his maintenance mode.  He may be able to progress to vent freedom once he undergoes tracheostomy, may only need the trach for airway protection.  We will try to push for ATC soon as possible -VAP prevention order set  Sepsis of unknown etiology.  No infiltrates on x-ray or sputum.  Remaining indwelling lines are long-term dialysis catheter and peritoneal catheter, both of which do not appear infected.  Urine culture 9/5 negative.  LFTs 9/7 reassuring -Stop vancomycin on 9/9 (5 days empiric) -Currently day 6 of 7 empiric cefepime, plan to stop 9/11 -If recurrent fever, hemodynamic instability then may need to consider line change.  Repeat blood  cultures drawn 9/5 remain negative  ESRD stage 5, urinary retention requiring frequent straight catheterization. Hyperphosphatemia. -receiving HD at [facility], TTS -Peritoneal Cath placed on 8/27 to replace old R IJ P: -Undergoing HD today 9/10, planning for 3 L volume removal.  Appreciate nephrology management  Angioedema of tongue, improving -etiology unclear, no signs of systemic edema, rash or allergic reaction, no ACEI, on ARB at home P: -Tongue does appear to be decreasing in size.  Continue to follow  Anemia of chronic disease and iron deficiency-stable -s/p 1 unit PRBCs 8/29 -iron saturation 3% P: -Continue to avoid iron infusions due to concern for angioedema of unknown etiology -Transfuse for hemoglobin less than 7 or active bleeding with hemodynamic instability -Minimize blood draws as able  Hypertension-improved -Continue amlodipine 5 mg -Labetalol as needed  Type 2 DM, uncontrolled, but improving since glargine started P: -Lantus 30 -Scaling scale insulin as per protocol  Secondary Hyperparathyroidism PTH 93 on 8/28 P: -Tube feeding on hold in preparation of tracheostomy, then restart Nepro  Diarrhea P: FMS -On tube feeding.  Low suspicion C. difficile.  WBC stable.  Could reconsider if a clinical change, fever, etc.   Best Practice / Goals of Care / Disposition.   Diet: Tube feeds at  goal Pain/Anxiety/Delirium protocol (if indicated): fentanyl, midazolam prn VAP protocol (if indicated): ordered DVT prophylaxis: heparin SQ GI prophylaxis: famotidine Glucose control: SSI, Lantus Mobility: Bed Code Status: Full Family Communication: spoke with pt's wife by phone 9/8.  Plan to update after tracheostomy 9/10 Disposition: ICU   Independent CC time 31 minutes   Baltazar Apo, MD, PhD 08/13/2019, 9:07 AM [facility] [phone] or if no answer [phone]

## 2019-08-13 NOTE — Procedures (Signed)
I was present at this dialysis session. I have reviewed the session itself and made appropriate changes.   Seen on dialysis, tolerating treatment well.  Goal UF 3 L.  Using 2K bath.  Blood pressure stable.  TDC with QB 400.  Pre-HD BUN 136, follow post level.  Is on high-protein enteral nutrition.  We will flush PD catheter today.  Filed Weights   08/12/19 0400 08/13/19 0400 08/13/19 0640  Weight: 90.3 kg 94.7 kg 94.7 kg    Recent Labs  Lab 08/13/19 0235  NA 137  K 4.6  CL 97*  CO2 18*  GLUCOSE 210*  BUN 136*  CREATININE 8.29*  CALCIUM 9.0  PHOS 9.3*    Recent Labs  Lab 08/11/19 0657 08/12/19 0235 08/13/19 0235  WBC 13.9* 17.3* 15.6*  HGB 9.5* 9.9* 9.0*  HCT 30.7* 32.1* 30.2*  MCV 91.9 90.7 94.4  PLT 353 418* 357    Scheduled Meds: . acetaminophen (TYLENOL) oral liquid 160 mg/5 mL  650 mg Per Tube Q6H  . amLODipine  5 mg Per Tube Daily  . calcium acetate (Phos Binder)  1,334 mg Per Tube TID WC  . chlorhexidine gluconate (MEDLINE KIT)  15 mL Mouth Rinse BID  . Chlorhexidine Gluconate Cloth  6 each Topical Daily  . darbepoetin (ARANESP) injection - DIALYSIS  60 mcg Intravenous Q Tue-HD  . etomidate  40 mg Intravenous Once  . famotidine  10 mg Per Tube BID  . feeding supplement (NEPRO CARB STEADY)  1,000 mL Per Tube Q24H  . feeding supplement (PRO-STAT SUGAR FREE 64)  60 mL Per Tube BID  . fentaNYL (SUBLIMAZE) injection  200 mcg Intravenous Once  . heparin  5,000 Units Subcutaneous Q8H  . insulin aspart  0-20 Units Subcutaneous Q4H  . insulin glargine  30 Units Subcutaneous Daily  . mouth rinse  15 mL Mouth Rinse 10 times per day  . midazolam  5 mg Intravenous Once  . propofol  500 mg Intravenous Once  . sodium chloride flush  3 mL Intravenous Q12H  . vecuronium  10 mg Intravenous Once   Continuous Infusions: . sodium chloride 250 mL (08/09/19 1618)  . sodium chloride    . sodium chloride    . ceFEPime (MAXIPIME) IV 200 mL/hr at 08/12/19 1800  . fentaNYL  infusion INTRAVENOUS 100 mcg/hr (08/13/19 0700)   PRN Meds:.sodium chloride, sodium chloride, sodium chloride, acetaminophen (TYLENOL) oral liquid 160 mg/5 mL, albuterol, alteplase, alteplase, fentaNYL, fentaNYL (SUBLIMAZE) injection, Gerhardt's butt cream, heparin, heparin, heparin, heparin, heparin, labetalol, lidocaine (PF), lidocaine-prilocaine, midazolam, ondansetron (ZOFRAN) IV, pentafluoroprop-tetrafluoroeth, pentafluoroprop-tetrafluoroeth, sodium chloride flush   Pearson Grippe  MD 08/13/2019, 8:51 AM

## 2019-08-14 ENCOUNTER — Inpatient Hospital Stay (HOSPITAL_COMMUNITY): Payer: 59

## 2019-08-14 LAB — GLUCOSE, CAPILLARY
Glucose-Capillary: 158 mg/dL — ABNORMAL HIGH (ref 70–99)
Glucose-Capillary: 158 mg/dL — ABNORMAL HIGH (ref 70–99)
Glucose-Capillary: 123 mg/dL — ABNORMAL HIGH (ref 70–99)
Glucose-Capillary: 162 mg/dL — ABNORMAL HIGH (ref 70–99)
Glucose-Capillary: 158 mg/dL — ABNORMAL HIGH (ref 70–99)
Glucose-Capillary: 159 mg/dL — ABNORMAL HIGH (ref 70–99)

## 2019-08-14 LAB — BASIC METABOLIC PANEL
Anion gap: 19 — ABNORMAL HIGH (ref 5–15)
BUN: 79 mg/dL — ABNORMAL HIGH (ref 6–20)
CO2: 22 mmol/L (ref 22–32)
Calcium: 9.3 mg/dL (ref 8.9–10.3)
Chloride: 96 mmol/L — ABNORMAL LOW (ref 98–111)
Creatinine, Ser: 6.62 mg/dL — ABNORMAL HIGH (ref 0.61–1.24)
GFR calc Af Amer: 10 mL/min — ABNORMAL LOW (ref >60)
GFR calc non Af Amer: 9 mL/min — ABNORMAL LOW (ref >60)
Glucose, Bld: 177 mg/dL — ABNORMAL HIGH (ref 70–99)
Potassium: 4.1 mmol/L (ref 3.5–5.1)
Sodium: 137 mmol/L (ref 135–145)

## 2019-08-14 LAB — CBC
HCT: 31.6 % — ABNORMAL LOW (ref 39.0–52.0)
Hemoglobin: 9.6 g/dL — ABNORMAL LOW (ref 13.0–17.0)
MCH: 28.3 pg (ref 26.0–34.0)
MCHC: 30.4 g/dL (ref 30.0–36.0)
MCV: 93.2 fL (ref 80.0–100.0)
Platelets: 406 10*3/uL — ABNORMAL HIGH (ref 150–400)
RBC: 3.39 MIL/uL — ABNORMAL LOW (ref 4.22–5.81)
RDW: 14.9 % (ref 11.5–15.5)
WBC: 14.4 10*3/uL — ABNORMAL HIGH (ref 4.0–10.5)
nRBC: 0 % (ref 0.0–0.2)

## 2019-08-14 LAB — MAGNESIUM: Magnesium: 2.6 mg/dL — ABNORMAL HIGH (ref 1.7–2.4)

## 2019-08-14 NOTE — Plan of Care (Signed)
  Problem: Clinical Measurements: Goal: Ability to maintain clinical measurements within normal limits will improve Outcome: Progressing Goal: Respiratory complications will improve Outcome: Progressing   Problem: Elimination: Goal: Will not experience complications related to bowel motility Outcome: Progressing   Pt has not been consistently tachycardic, only during turns and suctioning. Pt has not required PRN blood pressure medication. Pt has had bowel movements throughout the night thru the flexiseal. Pt has been synchronous with the vent throughout the night, no shortness of breath or signs of distress noted. Will continue to monitor.

## 2019-08-14 NOTE — Progress Notes (Signed)
Assisted tele visit to patient with wife.  Larry Banks, [name], RN

## 2019-08-14 NOTE — Progress Notes (Signed)
PULMONARY / CRITICAL CARE MEDICINE   NAME:  FARID BERGUM, MRN:  [id number], DOB:  1971/04/13, LOS: 72 ADMISSION DATE:  07/26/2019, CONSULTATION DATE:  07/31/2019  REFERRING MD:  EDP Dr. Tomi Bamberger   CHIEF COMPLAINT:  Cardiac Arrest/Resp Failure   BRIEF HISTORY:    48 year old male with medical history significant for hypertension, PAD, hyperlipidemia, CKD Stage 5 developed shortness of breath at home.  Patient does have CKD Stage 5 .  Is followed at [facility].  Recently had peritoneal dialysis catheter placed on August 27. Has not started Dialysis yet.  On arrival to the emergency room patient suffered a cardiac arrest with asystole.  He received CPR x15 minutes.  He receieved hyperkalemic protocol due to his underlying chronic kidney disease with 1 amp bicarb, 1 amp D50, insulin 10 units regular IV.  He received epi x3.  He had ROSC after 15 minutes.  He did require very brief Neo-Synephrine for hypotension.  He had significant agitation was started on sedation with Versed and Fentanyl.    Patient was transported to [facility] ICU for [name] to admit.  Remains intubated without significant neurologic recovery. EEG and CT suggestive of severe anoxic brain injury  SIGNIFICANT PAST MEDICAL HISTORY   Hypertension, hyperlipidemia, PAD, chronic kidney disease stage V  SIGNIFICANT EVENTS:  8/26-  TTS HD - last 07/29/2019 [name] (old tunneled Rt HD cah) 8/27 - PD cath - for a planned change from iHD to PD 8/29- Cardiac arrest, CPR x15 minutes, epi x3. Per renal CXR- c/w pulmonary edema 8/31- 1 unit PRBC, paralyzed due to vent dysynchrony 9/1- off pressors 9/5 antibiotics restarted- fevers, leukocytosis, arrhythmias  STUDIES:   8/29 CT head  > no acute abnormality 8/29 CT abdomen pelvis > bilateral lower lobe consolidation with small pleural effusion.  Groundglass opacities lower portion of the lung.  Nonobstructive renal calculus, 8/29 Echo > ef 45-50%, hypokineses of the mid-inferior and  lateral wall 8/29 - EEG> diffuse slowing and generalized suppression suggestive of profound encephalopathy, no seizure activity 9/1 CT head>severe diffuse brain edema compatible with widespread hypoxic injury 9/2 EEG> suggestive ofprofound diffuse encephalopathy,could besecondary to anoxic/hypoxic brain injury.No seizures or epileptiform discharges were seen throughout the recording.  9/3 MRI brain>Diffuse hypoxic ischemic injury most pronounced affecting the cerebral hemispheric cortex but with some involvement also of the basal ganglia and thalami. 9/4 & 9/5: CXR- no opacities  CULTURES:  8/29 BC >>Negative  8/29 Sputum >> Rare GNR, GPC>> Normal Flora 8/29 COVID 19 >> NEG  MRSA surveillance negative  9/5 blood culture >> 9/5 urine culture >> negative  ANTIBIOTICS:  Unasyn 8/29 >9/1 Cefepime 9/5> 9/11 vanc 9/5 > 9/9  LINES/TUBES:  Peritoneal cath 8/27 (PTA )  R HD cath ? >>  ------- 8/29 ETT >> 9/10 Trach (JY) 9/10 >>  8/29 L IJ CVL> 9/4 8/29 A line>>8/30 Foley cath (replaced) 9/6>>  CONSULTANTS:  Nephrology  Neurology  SUBJECTIVE:  Tracheostomy performed [id number], uncomplicated Currently on pressure support 12 No neurological changes reported   CONSTITUTIONAL: BP 115/90   Pulse (!) 114   Temp 98.8 F (37.1 C) (Oral)   Resp 16   Ht 5\' 6"  (1.676 m)   Wt 93.8 kg   SpO2 99%   BMI 33.38 kg/m   I/O last 3 completed shifts: In: 560.2 [I.V.:250.2; NG/GT:210; IV Piggyback:100] Out: [id number] [Urine:570; Other:3000; Stool:425]     Vent Mode: PSV;CPAP FiO2 (%):  [30 %-100 %] 30 % Set Rate:  [10 bmp-18 bmp] 10  bmp Vt Set:  [500 mL] 500 mL PEEP:  [5 cmH20] 5 cmH20 Pressure Support:  [10 [id number] cmH20] 12 cmH20 Plateau Pressure:  [14 [id number] cmH20] 14 cmH20  General: Ill-appearing obese man, ventilated HENT: Tongue size slightly improved, still enlarged, no ulcers noted.  Tracheostomy in place Eyes: Opens eyes to voice.  Pupils equal, CV: Distant, regular, no  murmur Lungs: Distant bilateral breath sounds, decreased posterior laterally, no wheezing ABD: Soft, obese, nondistended, positive bowel sounds GU: Foley catheter in place, scant urine EXT: No edema Skin: No rash Neuro: He will consistently open his eyes to voice but does not track.  Pupils equal, react.  He flexes his bilateral upper extremities to pain but no purposeful movement noted.  Has not followed commands.   RESOLVED PROBLEM LIST  NA ASSESSMENT AND PLAN   PEA out of hospital ready active arrest with acute hypoxic respiratory failure and anoxic brain injury.  Etiology of arrest unknown. Transitioned from HD to PD and had missed dialysis the day of admission, presenting K was WNL. S/p 36 degree TTM.  EEG without seizure activity but profound diffuse encephalopathy. -Echo will moderate hypokinesis of the mid-inferior and lateral wall, new since echo in 2019 -MRI with restricted diffusion throughout the entire cortex. P: -Prolonged support desired by family to allow for possible neurological improvement.  Tracheostomy on 9/10.  Appreciate palliative care input -PSV currently, push for ATC soon as possible as he can tolerate. -Standard trach care -VAP prevention order set  Sepsis of unknown etiology.  No infiltrates on x-ray or sputum.  Remaining indwelling lines are long-term dialysis catheter and peritoneal catheter, both of which do not appear infected.  Urine culture 9/5 negative.  LFTs 9/7 reassuring -Stopped vancomycin on 9/9 (5 days empiric) -Plan to complete cefepime on 9/11, 7 days total -Follow for any evidence recurrent fever, hemodynamic instability.  If present then consider line change.  His blood cultures from 9/5 remain negative.  ESRD stage 5, urinary retention requiring frequent straight catheterization. Hyperphosphatemia. -receiving HD at [facility], TTS -Peritoneal Cath placed on 8/27 to replace old R IJ P: -HD as per nephrology plans.  Angioedema of tongue,  improving -etiology unclear, no signs of systemic edema, rash or allergic reaction, no ACEI, on ARB at home P: -Appears to be improving.  Continue to follow off steroids, etc.  Anemia of chronic disease and iron deficiency-stable -s/p 1 unit PRBCs 8/29 -iron saturation 3% P: -Continue to avoid iron infusions due to concern for angioedema of unknown etiology -Transfusion goal hemoglobin 7 -Minimize blood draws as able  Hypertension-improved -Continue amlodipine 5 mg as ordered -Labetalol as needed  Type 2 DM, uncontrolled, but improving since glargine started P: -Continue same Lantus 30 -Scaling scale insulin as per protocol  Secondary Hyperparathyroidism PTH 93 on 8/28 P: -Place core tract tube and restart Nepro TF -Will need to consult either IR or CCS for PEG placement  Diarrhea P: FMS -On tube feeding.  Low suspicion C. difficile.  WBC stable.  Could reconsider if a clinical change, fever, etc.   Best Practice / Goals of Care / Disposition.   Diet: Tube feeds restart when NG tube placed, will need PEG Pain/Anxiety/Delirium protocol (if indicated): Transition off continuous infusion, to IV pushes VAP protocol (if indicated): ordered DVT prophylaxis: heparin SQ GI prophylaxis: famotidine Glucose control: SSI, Lantus Mobility: Bed Code Status: Full Family Communication: Updated 9/10 following tracheostomy Disposition: ICU   Independent CC time 31 minutes   Baltazar Apo, MD, PhD  08/14/2019, 9:56 AM [facility] [phone] or if no answer [phone]

## 2019-08-14 NOTE — Progress Notes (Signed)
Palliative:  I have reviewed chart and discussed with RN. I attempted to call wife, Larry Banks, to continue conversation and support but she was unavailable and no voicemail to leave message. I will plan to follow up Monday 08/17/19. Please call [phone] for acute palliative needs over the weekend. Wife has consistently requested full code, full aggressive care.   No charge  Vinie Sill, NP Palliative Medicine Team Pager [phone] (Please see [email or web address] for schedule) Team Phone [phone]

## 2019-08-14 NOTE — Progress Notes (Signed)
[facility]    NEPHROLOGY PROGRESS NOTE  SUBJECTIVE: Patient awake, unable to provide review of systems.  Discussed with nursing at bedside.    OBJECTIVE:  Vitals:   08/14/19 1200 08/14/19 1300  BP: 104/78 124/89  Pulse: (!) 117 (!) 121  Resp: 19 (!) 21  Temp:    SpO2: 99% 97%    Intake/Output Summary (Last 24 hours) at 08/14/2019 1419 Last data filed at 08/14/2019 1109 Gross per 24 hour  Intake 143 ml  Output 730 ml  Net -587 ml      General: Awake, not interactive, no acute distress.   HEENT: MMM [place] AT anicteric sclera Neck:  No JVD, no adenopathy CV:  Heart RRR, right IJ TDC Lungs:  L/S CTA bilaterally Abd:  abd SNT/ND with normal BS GU:  Bladder non-palpable Extremities:  No LE edema. Skin:  No skin rash  MEDICATIONS:  . amLODipine  5 mg Per Tube Daily  . calcium acetate (Phos Binder)  1,334 mg Per Tube TID WC  . chlorhexidine gluconate (MEDLINE KIT)  15 mL Mouth Rinse BID  . Chlorhexidine Gluconate Cloth  6 each Topical Daily  . darbepoetin (ARANESP) injection - DIALYSIS  60 mcg Intravenous Q Tue-HD  . famotidine  10 mg Per Tube BID  . feeding supplement (NEPRO CARB STEADY)  1,000 mL Per Tube Q24H  . feeding supplement (PRO-STAT SUGAR FREE 64)  60 mL Per Tube BID  . fentaNYL (SUBLIMAZE) injection  200 mcg Intravenous Once  . heparin  5,000 Units Subcutaneous Q8H  . insulin aspart  0-20 Units Subcutaneous Q4H  . insulin glargine  30 Units Subcutaneous Daily  . mouth rinse  15 mL Mouth Rinse 10 times per day  . sodium chloride flush  3 mL Intravenous Q12H       LABS:   CBC Latest Ref Rng & Units 08/14/2019 08/13/2019 08/12/2019  WBC 4.0 - 10.5 K/uL 14.4(H) 15.6(H) 17.3(H)  Hemoglobin 13.0 - 17.0 g/dL 9.6(L) 9.0(L) 9.9(L)  Hematocrit 39.0 - 52.0 % 31.6(L) 30.2(L) 32.1(L)  Platelets 150 - 400 K/uL 406(H) 357 418(H)    CMP Latest Ref Rng & Units 08/14/2019 08/13/2019 08/12/2019  Glucose 70 - 99 mg/dL 177(H) 210(H) 166(H)  BUN 6 - 20 mg/dL  79(H) 136(H) 96(H)  Creatinine 0.61 - 1.24 mg/dL 6.62(H) 8.29(H) 6.86(H)  Sodium 135 - 145 mmol/L 137 137 134(L)  Potassium 3.5 - 5.1 mmol/L 4.1 4.6 4.1  Chloride 98 - 111 mmol/L 96(L) 97(L) 94(L)  CO2 22 - 32 mmol/L 22 18(L) 23  Calcium 8.9 - 10.3 mg/dL 9.3 9.0 9.0  Total Protein 6.5 - 8.1 g/dL - - -  Total Bilirubin 0.3 - 1.2 mg/dL - - -  Alkaline Phos 38 - 126 U/L - - -  AST 15 - 41 U/L - - -  ALT 0 - 44 U/L - - -    Lab Results  Component Value Date   PTH 93 (H) 07/17/2019   CALCIUM 9.3 08/14/2019   CAION 1.00 (L) 08/03/2019   PHOS 9.3 (H) 08/13/2019       Component Value Date/Time   COLORURINE YELLOW 08/08/2019 0106   APPEARANCEUR CLEAR 08/08/2019 0106   LABSPEC 1.014 08/08/2019 0106   PHURINE 5.0 08/08/2019 0106   GLUCOSEU NEGATIVE 08/08/2019 0106   HGBUR MODERATE (A) 08/08/2019 0106   BILIRUBINUR NEGATIVE 08/08/2019 0106   KETONESUR NEGATIVE 08/08/2019 0106   PROTEINUR 100 (A) 08/08/2019 0106   NITRITE NEGATIVE 08/08/2019 0106   LEUKOCYTESUR NEGATIVE  08/08/2019 0106      Component Value Date/Time   PHART 7.424 08/03/2019 0651   PCO2ART 42.0 08/03/2019 0651   PO2ART 133.0 (H) 08/03/2019 0651   HCO3 27.5 08/03/2019 0651   TCO2 29 08/03/2019 0651   ACIDBASEDEF 1.3 07/06/2019 0755   O2SAT 99.0 08/03/2019 0651       Component Value Date/Time   IRON 8 (L) 07/18/2019 1336   TIBC 307 07/12/2019 1336   IRONPCTSAT 3 (L) 07/29/2019 1336       ASSESSMENT/PLAN:     1. ESRD THS Via R IJ TDC with [facility] 2. S/p cardiac arrest 3. S/p PD cath placement 8/27 - continue to flush with HD 4. VDRF - oxygenating well on vent 5. ANoxic brain injury - ?overall plan 6. Hyperphosphatemia on binders 7. Fevers/Leukocytsois, B Cx NGTD, CEfepime/Vanc; has defervesced 8. Anemia of CKD.  Hgb stable.  Iron low.  Will plan IV iron once infection clears    [employer], DO, FACP

## 2019-08-14 NOTE — Procedures (Signed)
Cortrak  Person Inserting Tube:  Esaw Dace, RD Tube Type:  Cortrak - 43 inches Tube Location:  Left nare Initial Placement:  Stomach Secured by: Bridle Technique Used to Measure Tube Placement:  Documented cm marking at nare/ corner of mouth Cortrak Secured At:  68 cm Procedure Comments:  Cortrak Tube Team Note:  Consult received to place a Cortrak feeding tube.   No x-ray is required. RN may begin using tube.   If the tube becomes dislodged please keep the tube and contact the Cortrak team at [email or web address] (password [email or web address]) for replacement.  If after hours and replacement cannot be delayed, place a NG tube and confirm placement with an abdominal x-ray.   [employer] MS, RDN, LDN, CNSC [phone] Pager  [phone] Weekend/On-Call Pager

## 2019-08-15 LAB — BASIC METABOLIC PANEL
Anion gap: 20 — ABNORMAL HIGH (ref 5–15)
BUN: 128 mg/dL — ABNORMAL HIGH (ref 6–20)
CO2: 20 mmol/L — ABNORMAL LOW (ref 22–32)
Calcium: 9.8 mg/dL (ref 8.9–10.3)
Chloride: 99 mmol/L (ref 98–111)
Creatinine, Ser: 8.77 mg/dL — ABNORMAL HIGH (ref 0.61–1.24)
GFR calc Af Amer: 7 mL/min — ABNORMAL LOW (ref >60)
GFR calc non Af Amer: 6 mL/min — ABNORMAL LOW (ref >60)
Glucose, Bld: 187 mg/dL — ABNORMAL HIGH (ref 70–99)
Potassium: 4.2 mmol/L (ref 3.5–5.1)
Sodium: 139 mmol/L (ref 135–145)

## 2019-08-15 LAB — CBC
HCT: 31.6 % — ABNORMAL LOW (ref 39.0–52.0)
Hemoglobin: 9.8 g/dL — ABNORMAL LOW (ref 13.0–17.0)
MCH: 28.2 pg (ref 26.0–34.0)
MCHC: 31 g/dL (ref 30.0–36.0)
MCV: 90.8 fL (ref 80.0–100.0)
Platelets: 401 10*3/uL — ABNORMAL HIGH (ref 150–400)
RBC: 3.48 MIL/uL — ABNORMAL LOW (ref 4.22–5.81)
RDW: 14.6 % (ref 11.5–15.5)
WBC: 15.4 10*3/uL — ABNORMAL HIGH (ref 4.0–10.5)
nRBC: 0 % (ref 0.0–0.2)

## 2019-08-15 LAB — GLUCOSE, CAPILLARY
Glucose-Capillary: 135 mg/dL — ABNORMAL HIGH (ref 70–99)
Glucose-Capillary: 172 mg/dL — ABNORMAL HIGH (ref 70–99)
Glucose-Capillary: 181 mg/dL — ABNORMAL HIGH (ref 70–99)
Glucose-Capillary: 190 mg/dL — ABNORMAL HIGH (ref 70–99)
Glucose-Capillary: 214 mg/dL — ABNORMAL HIGH (ref 70–99)
Glucose-Capillary: 301 mg/dL — ABNORMAL HIGH (ref 70–99)

## 2019-08-15 LAB — MAGNESIUM: Magnesium: 2.9 mg/dL — ABNORMAL HIGH (ref 1.7–2.4)

## 2019-08-15 LAB — PHOSPHORUS: Phosphorus: 7.1 mg/dL — ABNORMAL HIGH (ref 2.5–4.6)

## 2019-08-15 MED ORDER — SODIUM CHLORIDE 0.9 % IV SOLN: 100.0000 mL | INTRAVENOUS | Status: DC | PRN

## 2019-08-15 MED ORDER — LORAZEPAM 2 MG/ML IJ SOLN
2.0000 mg | Freq: Once | INTRAMUSCULAR | Status: AC
  Administered 2019-08-15: 10:00:00 2 mg via INTRAVENOUS

## 2019-08-15 MED ORDER — HEPARIN SODIUM (PORCINE) 1000 UNIT/ML IJ SOLN
INTRAMUSCULAR | Status: AC
  Filled 2019-08-15: qty 6

## 2019-08-15 MED ORDER — HEPARIN SODIUM (PORCINE) 1000 UNIT/ML DIALYSIS
1000.0000 [IU] | INTRAMUSCULAR | Status: DC | PRN
  Administered 2019-08-15: 11:00:00 1000 [IU] via INTRAVENOUS_CENTRAL

## 2019-08-15 MED ORDER — LORAZEPAM 2 MG/ML IJ SOLN
INTRAMUSCULAR | Status: AC
  Filled 2019-08-15: qty 1

## 2019-08-15 NOTE — Progress Notes (Signed)
Assisted tele visit to patient with son.  Haevyn Ury Ann, RN  

## 2019-08-15 NOTE — Progress Notes (Signed)
90cc of fentanyl wasted in receptacle witnessed by rachel

## 2019-08-15 NOTE — Progress Notes (Signed)
assisted family with video/camera time via elink

## 2019-08-15 NOTE — Progress Notes (Signed)
[facility]    NEPHROLOGY PROGRESS NOTE  SUBJECTIVE: Patient awake, unable to provide review of systems.  Discussed with nursing at bedside.    OBJECTIVE:  Vitals:   08/15/19 0930 08/15/19 1000  BP: 136/88 (!) 124/92  Pulse: 100 84  Resp: (!) 23 (!) 26  Temp:    SpO2:  100%    Intake/Output Summary (Last 24 hours) at 08/15/2019 1015 Last data filed at 08/15/2019 1000 Gross per 24 hour  Intake 927 ml  Output 775 ml  Net 152 ml      General: Not interactive, no acute distress.   HEENT: MMM [place] AT anicteric sclera Neck:  No JVD, no adenopathy CV:  Heart RRR, right IJ TDC Lungs:  L/S CTA bilaterally Abd:  abd SNT/ND with normal BS GU:  Bladder non-palpable Extremities:  No LE edema. Skin:  No skin rash  MEDICATIONS:  . amLODipine  5 mg Per Tube Daily  . calcium acetate (Phos Binder)  1,334 mg Per Tube TID WC  . chlorhexidine gluconate (MEDLINE KIT)  15 mL Mouth Rinse BID  . Chlorhexidine Gluconate Cloth  6 each Topical Daily  . darbepoetin (ARANESP) injection - DIALYSIS  60 mcg Intravenous Q Tue-HD  . famotidine  10 mg Per Tube BID  . feeding supplement (NEPRO CARB STEADY)  1,000 mL Per Tube Q24H  . feeding supplement (PRO-STAT SUGAR FREE 64)  60 mL Per Tube BID  . fentaNYL (SUBLIMAZE) injection  200 mcg Intravenous Once  . heparin      . heparin  5,000 Units Subcutaneous Q8H  . insulin aspart  0-20 Units Subcutaneous Q4H  . insulin glargine  30 Units Subcutaneous Daily  . mouth rinse  15 mL Mouth Rinse 10 times per day  . sodium chloride flush  3 mL Intravenous Q12H       LABS:   CBC Latest Ref Rng & Units 08/15/2019 08/14/2019 08/13/2019  WBC 4.0 - 10.5 K/uL 15.4(H) 14.4(H) 15.6(H)  Hemoglobin 13.0 - 17.0 g/dL 9.8(L) 9.6(L) 9.0(L)  Hematocrit 39.0 - 52.0 % 31.6(L) 31.6(L) 30.2(L)  Platelets 150 - 400 K/uL 401(H) 406(H) 357    CMP Latest Ref Rng & Units 08/15/2019 08/14/2019 08/13/2019  Glucose 70 - 99 mg/dL 187(H) 177(H) 210(H)  BUN 6 - 20  mg/dL 128(H) 79(H) 136(H)  Creatinine 0.61 - 1.24 mg/dL 8.77(H) 6.62(H) 8.29(H)  Sodium 135 - 145 mmol/L 139 137 137  Potassium 3.5 - 5.1 mmol/L 4.2 4.1 4.6  Chloride 98 - 111 mmol/L 99 96(L) 97(L)  CO2 22 - 32 mmol/L 20(L) 22 18(L)  Calcium 8.9 - 10.3 mg/dL 9.8 9.3 9.0  Total Protein 6.5 - 8.1 g/dL - - -  Total Bilirubin 0.3 - 1.2 mg/dL - - -  Alkaline Phos 38 - 126 U/L - - -  AST 15 - 41 U/L - - -  ALT 0 - 44 U/L - - -    Lab Results  Component Value Date   PTH 93 (H) 08/03/2019   CALCIUM 9.8 08/15/2019   CAION 1.00 (L) 08/03/2019   PHOS 7.1 (H) 08/15/2019       Component Value Date/Time   COLORURINE YELLOW 08/08/2019 0106   APPEARANCEUR CLEAR 08/08/2019 0106   LABSPEC 1.014 08/08/2019 0106   PHURINE 5.0 08/08/2019 0106   GLUCOSEU NEGATIVE 08/08/2019 0106   HGBUR MODERATE (A) 08/08/2019 0106   BILIRUBINUR NEGATIVE 08/08/2019 0106   KETONESUR NEGATIVE 08/08/2019 0106   PROTEINUR 100 (A) 08/08/2019 0106   NITRITE NEGATIVE  08/08/2019 0106   LEUKOCYTESUR NEGATIVE 08/08/2019 0106      Component Value Date/Time   PHART 7.424 08/03/2019 0651   PCO2ART 42.0 08/03/2019 0651   PO2ART 133.0 (H) 08/03/2019 0651   HCO3 27.5 08/03/2019 0651   TCO2 29 08/03/2019 0651   ACIDBASEDEF 1.3 08/03/2019 0755   O2SAT 99.0 08/03/2019 0651       Component Value Date/Time   IRON 8 (L) 07/31/2019 1336   TIBC 307 07/31/2019 1336   IRONPCTSAT 3 (L) 07/08/2019 1336       ASSESSMENT/PLAN:     1. ESRD THS Via R IJ TDC with [facility] 2. S/p cardiac arrest 3. S/p PD cath placement 8/27 - continue to flush with HD 4. VDRF - oxygenating well on vent 5. Anoxic brain injury - continue full support per family. 6. Hyperphosphatemia on binders 7. Fevers/Leukocytsois, B Cx NGTD, CEfepime/Vanc; has defervesced 8. Anemia of CKD.  Hgb stable.  Iron low.  Will plan IV iron once infection clears    Nancy Finnigan, DO, FACP  

## 2019-08-15 NOTE — Procedures (Signed)
I evaluated the patient while on hemodialysis.  I reviewed the patient's treatment plan.  Patient tolerating dialysis well.  Discussed with RN.  For PD catheter flush today.

## 2019-08-15 NOTE — Plan of Care (Signed)
  Problem: Education: Goal: Knowledge of General Education information will improve Description: Including pain rating scale, medication(s)/side effects and non-pharmacologic comfort measures Outcome: Not Progressing   Problem: Health Behavior/Discharge Planning: Goal: Ability to manage health-related needs will improve Outcome: Not Progressing   Problem: Clinical Measurements: Goal: Ability to maintain clinical measurements within normal limits will improve Outcome: Not Progressing Goal: Will remain free from infection Outcome: Not Progressing Goal: Diagnostic test results will improve Outcome: Not Progressing Goal: Respiratory complications will improve Outcome: Not Progressing Goal: Cardiovascular complication will be avoided Outcome: Not Progressing   Problem: Activity: Goal: Risk for activity intolerance will decrease Outcome: Not Progressing   Problem: Nutrition: Goal: Adequate nutrition will be maintained Outcome: Not Progressing   Problem: Coping: Goal: Level of anxiety will decrease Outcome: Not Progressing   Problem: Elimination: Goal: Will not experience complications related to bowel motility Outcome: Not Progressing Goal: Will not experience complications related to urinary retention Outcome: Not Progressing   Problem: Pain Managment: Goal: General experience of comfort will improve Outcome: Not Progressing   Problem: Skin Integrity: Goal: Risk for impaired skin integrity will decrease Outcome: Not Progressing   Problem: Activity: Goal: Ability to tolerate increased activity will improve Outcome: Not Progressing   Problem: Respiratory: Goal: Ability to maintain a clear airway and adequate ventilation will improve Outcome: Not Progressing   Problem: Role Relationship: Goal: Method of communication will improve Outcome: Not Progressing   Problem: Education: Goal: Ability to manage disease process will improve Outcome: Not Progressing    Problem: Cardiac: Goal: Ability to achieve and maintain adequate cardiopulmonary perfusion will improve Outcome: Not Progressing   Problem: Neurologic: Goal: Promote progressive neurologic recovery Outcome: Not Progressing   Problem: Skin Integrity: Goal: Risk for impaired skin integrity will be minimized. Outcome: Not Progressing

## 2019-08-15 NOTE — Progress Notes (Signed)
PULMONARY / CRITICAL CARE MEDICINE   NAME:  Larry Banks, MRN:  [id number], DOB:  [birth year], LOS: 93 ADMISSION DATE:  07/17/2019, CONSULTATION DATE:  07/23/2019  REFERRING MD:  EDP Dr. Tomi Bamberger   CHIEF COMPLAINT:  Cardiac Arrest/Resp Failure   BRIEF HISTORY:    48 year old male with medical history significant for hypertension, PAD, hyperlipidemia, CKD Stage 5 developed shortness of breath at home.  Patient does have CKD Stage 5 .  Is followed at [facility].  Recently had peritoneal dialysis catheter placed on August 27. Has not started Dialysis yet.  On arrival to the emergency room patient suffered a cardiac arrest with asystole.  He received CPR x15 minutes.  He receieved hyperkalemic protocol due to his underlying chronic kidney disease with 1 amp bicarb, 1 amp D50, insulin 10 units regular IV.  He received epi x3.  He had ROSC after 15 minutes.  He did require very brief Neo-Synephrine for hypotension.  He had significant agitation was started on sedation with Versed and Fentanyl.    Patient was transported to [facility] ICU for [name] to admit.  Remains intubated without significant neurologic recovery. EEG and CT suggestive of severe anoxic brain injury  SIGNIFICANT PAST MEDICAL HISTORY   Hypertension, hyperlipidemia, PAD, chronic kidney disease stage V  SIGNIFICANT EVENTS:  8/26-  TTS HD - last 07/29/2019 [name] (old tunneled Rt HD cah) 8/27 - PD cath - for a planned change from iHD to PD 8/29- Cardiac arrest, CPR x15 minutes, epi x3. Per renal CXR- c/w pulmonary edema 8/31- 1 unit PRBC, paralyzed due to vent dysynchrony 9/1- off pressors 9/5 antibiotics restarted- fevers, leukocytosis, arrhythmias  STUDIES:   8/29 CT head  > no acute abnormality 8/29 CT abdomen pelvis > bilateral lower lobe consolidation with small pleural effusion.  Groundglass opacities lower portion of the lung.  Nonobstructive renal calculus, 8/29 Echo > ef 45-50%, hypokineses of the mid-inferior and  lateral wall 8/29 - EEG> diffuse slowing and generalized suppression suggestive of profound encephalopathy, no seizure activity 9/1 CT head>severe diffuse brain edema compatible with widespread hypoxic injury 9/2 EEG> suggestive ofprofound diffuse encephalopathy,could besecondary to anoxic/hypoxic brain injury.No seizures or epileptiform discharges were seen throughout the recording.  9/3 MRI brain>Diffuse hypoxic ischemic injury most pronounced affecting the cerebral hemispheric cortex but with some involvement also of the basal ganglia and thalami. 9/4 & 9/5: CXR- no opacities  CULTURES:  8/29 BC >>Negative  8/29 Sputum >> Rare GNR, GPC>> Normal Flora 8/29 COVID 19 >> NEG  MRSA surveillance negative  9/5 blood culture >> 9/5 urine culture >> negative  ANTIBIOTICS:  Unasyn 8/29 >9/1 Cefepime 9/5> 9/11 vanc 9/5 > 9/9  LINES/TUBES:  Peritoneal cath 8/27 (PTA )  R HD cath ? >>  ------- 8/29 ETT >> 9/10 Trach (JY) 9/10 >>  8/29 L IJ CVL> 9/4 8/29 A line>>8/30 Foley cath (replaced) 9/6>>  CONSULTANTS:  Nephrology  Neurology  SUBJECTIVE:  No fever off antibiotics Did PS yesterday 9/11 No neurological changes noted Question whether he is biting tongue   CONSTITUTIONAL: BP (P) 137/88 Comment: Simultaneous filing. User may not have seen previous data.  Pulse (P) 93 Comment: Simultaneous filing. User may not have seen previous data.  Temp 98.2 F (36.8 C) (Oral)   Resp (!) (P) 22 Comment: Simultaneous filing. User may not have seen previous data.  Ht 5\' 6"  (1.676 m)   Wt 85.1 kg   SpO2 100%   BMI 30.28 kg/m   I/O last 3  completed shifts: In: 790 [I.V.:3; Other:20; NG/GT:767] Out: 1340 [Urine:865; Stool:475]     Vent Mode: SIMV;PRVC FiO2 (%):  [30 %] 30 % Set Rate:  [10 bmp] 10 bmp Vt Set:  [500 mL] 500 mL PEEP:  [5 cmH20] 5 cmH20 Pressure Support:  [10 cmH20-12 cmH20] 10 cmH20 Plateau Pressure:  [14 cmH20-15 cmH20] 14 cmH20  General: Obese ill-appearing  man, ventilated via tracheostomy HENT: Tongue remains enlarged although it is smaller through the week.  Question whether he is biting it somewhat, difficult open mouth Eyes: Opens eyes to voice, pupils equal CV: Distant, regular, no murmur Lungs: Coarse bilaterally, no wheezing ABD: Soft, nondistended, positive bowel sounds GU: Foley catheter in place, scant urine EXT: No significant edema Skin: No rash Neuro: He does open his eyes to voice, does not track, pupils are equal and react.  He did not move with stimulation today, has previously moved his bilateral upper extremities, withdrawing with pain    RESOLVED PROBLEM LIST  NA ASSESSMENT AND PLAN   PEA out of hospital ready active arrest with acute hypoxic respiratory failure and anoxic brain injury.  Etiology of arrest unknown. Transitioned from HD to PD and had missed dialysis the day of admission, presenting K was WNL. S/p 36 degree TTM.  EEG without seizure activity but profound diffuse encephalopathy. -Echo will moderate hypokinesis of the mid-inferior and lateral wall, new since echo in 2019 -MRI with restricted diffusion throughout the entire cortex. P: -Prolonged support desired by family to allow for possible neurological improvement.  Appreciate palliative care input -Continue standard trach care -Move to ATC 9/12 if he is is able to tolerate -He will need a PEG consult next week -VAP prevention order set  Sepsis of unknown etiology, resolved.  No infiltrates on x-ray or sputum.  Remaining indwelling lines are long-term dialysis catheter and peritoneal catheter which have not appeared to be infected.  Urine culture 9/5 negative.  LFTs 9/7 reassuring -Vancomycin and cefepime completed, following clinically off antibiotics -If any evidence for recurrent infection then consider line changes -His only non-HD access is a single peripheral IV, will place consult for midline IV placement  ESRD stage 5, urinary retention requiring  frequent straight catheterization. Hyperphosphatemia. -receiving HD at [facility], TTS -Peritoneal Cath placed on 8/27 to replace old R IJ P: -Hemodialysis as per nephrology plans, receiving today  Angioedema of tongue, improving -etiology unclear, no signs of systemic edema, rash or allergic reaction, no ACEI, on ARB at home P: -Appears to be improving.  Continue to follow off steroids, etc. -We will ask RT to try to place bite block as am concerned that he may be injuring the distal tongue  Anemia of chronic disease and iron deficiency-stable -s/p 1 unit PRBCs 8/29 -iron saturation 3% P: -Continue to avoid iron infusions due to concern for angioedema of unknown etiology -Transfusion goal hemoglobin 7 -Minimize blood draws as able  Hypertension-improved -Continue amlodipine 5 mg as ordered -Labetalol as needed  Type 2 DM, uncontrolled, but improving since glargine started P: -Continue same Lantus 30 -Scaling scale insulin as per protocol  Diarrhea on tube feeding P: FMS -Low suspicion C. difficile.  WBC stable. -Could consider C. difficile testing if clinical change   Best Practice / Goals of Care / Disposition.   Diet: Tube feeds restart when NG tube placed, will need PEG Pain/Anxiety/Delirium protocol (if indicated): Transitioned off continuous infusion, to IV pushes VAP protocol (if indicated): ordered DVT prophylaxis: heparin SQ GI prophylaxis: famotidine Glucose control:  SSI, Lantus Mobility: Bed Code Status: Full Family Communication: Updated daily Disposition: ICU   Independent CC time 31 minutes   Baltazar Apo, MD, PhD 08/15/2019, 9:31 AM [facility] [phone] or if no answer [phone]

## 2019-08-15 NOTE — Progress Notes (Signed)
Pt noted to have tremors to left arm, leg and head. Dr. Lamonte Sakai on unit, updated. 2mg  IV ativan x1 dose now ordered.

## 2019-08-16 ENCOUNTER — Inpatient Hospital Stay (HOSPITAL_COMMUNITY): Payer: 59

## 2019-08-16 LAB — BASIC METABOLIC PANEL
Anion gap: 22 — ABNORMAL HIGH (ref 5–15)
BUN: 128 mg/dL — ABNORMAL HIGH (ref 6–20)
CO2: 21 mmol/L — ABNORMAL LOW (ref 22–32)
Calcium: 9.7 mg/dL (ref 8.9–10.3)
Chloride: 92 mmol/L — ABNORMAL LOW (ref 98–111)
Creatinine, Ser: 7.9 mg/dL — ABNORMAL HIGH (ref 0.61–1.24)
GFR calc Af Amer: 8 mL/min — ABNORMAL LOW (ref >60)
GFR calc non Af Amer: 7 mL/min — ABNORMAL LOW (ref >60)
Glucose, Bld: 177 mg/dL — ABNORMAL HIGH (ref 70–99)
Potassium: 5 mmol/L (ref 3.5–5.1)
Sodium: 135 mmol/L (ref 135–145)

## 2019-08-16 LAB — GLUCOSE, CAPILLARY
Glucose-Capillary: 162 mg/dL — ABNORMAL HIGH (ref 70–99)
Glucose-Capillary: 165 mg/dL — ABNORMAL HIGH (ref 70–99)
Glucose-Capillary: 185 mg/dL — ABNORMAL HIGH (ref 70–99)
Glucose-Capillary: 186 mg/dL — ABNORMAL HIGH (ref 70–99)
Glucose-Capillary: 199 mg/dL — ABNORMAL HIGH (ref 70–99)
Glucose-Capillary: 224 mg/dL — ABNORMAL HIGH (ref 70–99)

## 2019-08-16 LAB — EXPECTORATED SPUTUM ASSESSMENT W GRAM STAIN, RFLX TO RESP C

## 2019-08-16 MED ORDER — METOPROLOL TARTRATE 5 MG/5ML IV SOLN
5.0000 mg | Freq: Once | INTRAVENOUS | Status: AC
  Administered 2019-08-16: 5 mg via INTRAVENOUS
  Filled 2019-08-16: qty 5

## 2019-08-16 MED ORDER — SODIUM CHLORIDE 0.9 % IV SOLN
250.0000 mg | Freq: Two times a day (BID) | INTRAVENOUS | Status: DC
  Administered 2019-08-16 – 2019-08-20 (×8): 250 mg via INTRAVENOUS
  Filled 2019-08-16 (×9): qty 2.5

## 2019-08-16 NOTE — Progress Notes (Addendum)
PULMONARY / CRITICAL CARE MEDICINE   NAME:  Larry Banks, MRN:  [id number], DOB:  1971-04-12, LOS: 42 ADMISSION DATE:  07/15/2019, CONSULTATION DATE:  07/04/2019  REFERRING MD:  EDP Dr. Tomi Bamberger   CHIEF COMPLAINT:  Cardiac Arrest/Resp Failure   BRIEF HISTORY:    48 year old male with medical history significant for hypertension, PAD, hyperlipidemia, CKD Stage 5 developed shortness of breath at home.  Patient does have CKD Stage 5 .  Is followed at [facility].  Recently had peritoneal dialysis catheter placed on August 27. Has not started Dialysis yet.  On arrival to the emergency room on 8/29  patient suffered a cardiac arrest with asystole.  He received CPR x15 minutes.  He receieved hyperkalemic protocol due to his underlying chronic kidney disease with 1 amp bicarb, 1 amp D50, insulin 10 units regular IV.  He received epi x3.  He had ROSC after 15 minutes.  He did require very brief Neo-Synephrine for hypotension.  He had significant agitation was started on sedation with Versed and Fentanyl.    Patient was transported to [facility] ICU for [name] to admit.  Remains intubated without significant neurologic recovery. EEG and CT suggestive of severe anoxic brain injury  SIGNIFICANT PAST MEDICAL HISTORY   Hypertension, hyperlipidemia, PAD, chronic kidney disease stage V  SIGNIFICANT EVENTS:  8/26-  TTS HD - last 07/29/2019 [name] (old tunneled Rt HD cah) 8/27 - PD cath - for a planned change from iHD to PD 8/29- Cardiac arrest, CPR x15 minutes, epi x3. Per renal CXR- c/w pulmonary edema 8/31- 1 unit PRBC, paralyzed due to vent dysynchrony 9/1- off pressors 9/5 antibiotics restarted- fevers, leukocytosis, arrhythmias  STUDIES:   8/29 CT head  > no acute abnormality 8/29 CT abdomen pelvis > bilateral lower lobe consolidation with small pleural effusion.  Groundglass opacities lower portion of the lung.  Nonobstructive renal calculus, 8/29 Echo > ef 45-50%, hypokineses of the mid-inferior  and lateral wall 8/29 - EEG> diffuse slowing and generalized suppression suggestive of profound encephalopathy, no seizure activity 9/1 CT head>severe diffuse brain edema compatible with widespread hypoxic injury 9/2 EEG> suggestive ofprofound diffuse encephalopathy,could besecondary to anoxic/hypoxic brain injury.No seizures or epileptiform discharges were seen throughout the recording.  9/3 MRI brain>Diffuse hypoxic ischemic injury most pronounced affecting the cerebral hemispheric cortex but with some involvement also of the basal ganglia and thalami. 9/4 & 9/5: CXR- no opacities  CULTURES:  8/29 BC >>Negative  8/29 Sputum >> Rare GNR, GPC>> Normal Flora 8/29 COVID 19 >> NEG  MRSA surveillance negative  9/5 blood culture >> 9/5 urine culture >> negative  ANTIBIOTICS:  Unasyn 8/29 >9/1 Cefepime 9/5> 9/11 vanc 9/5 > 9/9  LINES/TUBES:  Peritoneal cath 8/27 (PTA )  R HD cath ? >>  ------- 8/29 ETT >> 9/10 Trach (JY) 9/10 >>  8/29 L IJ CVL> 9/4 8/29 A line>>8/30 Foley cath (replaced) 9/6>>  CONSULTANTS:  Nephrology  Neurology  Subjective No neurological changes noted Possible seizure activity yesterday? Givn ativan  Low grade fever, intermittently Last 100.3 at 2300 Sinus tach several days (since 9/10) -1.2 L today    CONSTITUTIONAL: BP 124/87 (BP Location: Left Arm)   Pulse (!) 122   Temp 99.7 F (37.6 C) (Oral)   Resp (!) 24   Ht 5\' 6"  (1.676 m)   Wt 81.5 kg   SpO2 100%   BMI 29.00 kg/m   I/O last 3 completed shifts: In: 1975 [NG/GT:1975] Out: 3120 [Urine:495; Other:2500; Stool:125]  Vent Mode: CPAP;PSV FiO2 (%):  [40 %] 40 % Set Rate:  [10 bmp] 10 bmp Vt Set:  [500 mL] 500 mL PEEP:  [5 cmH20] 5 cmH20 Pressure Support:  [10 cmH20] 10 cmH20 Plateau Pressure:  [13 cmH20-15 cmH20] 14 cmH20  General: Obese ill-appearing man, ventilated via tracheostomy HENT: Tongue remains enlarged  Eyes: Eyes open, no change to voice stim, pupils equal CV:  Distant, regular, no murmur Lungs: Coarse bilaterally, no wheezing ABD: Soft, nondistended, positive bowel sounds GU: Foley catheter in place, scant urine EXT: No significant edema Skin: No rash Neuro: does not track, pupils are equal and react.  He did not move with stimulation today, has previously moved his bilateral upper extremities, withdrawing with pain Minimal tremor in L hand     RESOLVED PROBLEM LIST  NA ASSESSMENT AND PLAN   PEA out of hospital ready active arrest with acute hypoxic respiratory failure and anoxic brain injury.  Etiology of arrest unknown. Transitioned from HD to PD and had missed dialysis the day of admission, presenting K was WNL. S/p 36 degree TTM.  EEG without seizure activity but profound diffuse encephalopathy. -Echo will moderate hypokinesis of the mid-inferior and lateral wall, new since echo in 2019 -MRI with restricted diffusion throughout the entire cortex. P: -Prolonged support desired by family to allow for possible neurological improvement.  Appreciate palliative care input -Continue standard trach care -Move to ATC 9/12 if he is is able to tolerate -He will need a PEG consult next week -VAP prevention order set Hold off on PSV given tachycardia today  Sinus tachycardia: unclear cause.  Does not appear uncomfortable.  Improved last night with fentanyl but not this am. Per nurse wife mentioned he recently started something for his HR.  Trial of metop IV, can start po if improves.   Consider possible infection as well, though fevers diminishing.    Rhythmic L hand movements:  Discussed with neuro.  Will start renal dosed keppra.    Sepsis of unknown etiology, resolved.  L:ow grade fever yesterday, improving overall.   No infiltrates on prior  x-ray or sputum.  Remaining indwelling lines are long-term dialysis catheter and peritoneal catheter which have not appeared to be infected.  Urine culture 9/5 negative.  LFTs 9/7 reassuring -Vancomycin  and cefepime completed, following clinically off antibiotics -His only non-HD access is a single peripheral IV, will place consult for midline IV placement Follow up cultures Consider line changes if fevers develop again.  Will repeat cxr and repeat pan cultures today.   ESRD stage 5, urinary retention requiring frequent straight catheterization. Hyperphosphatemia. -receiving HD at [facility], TTS -Peritoneal Cath placed on 8/27 to replace old R IJ P: -Hemodialysis as per nephrology plans, received yesterday   Angioedema of tongue, improving -etiology unclear, no signs of systemic edema, rash or allergic reaction, no ACEI, on ARB at home P: -Appears to be improving.  Continue to follow off steroids, etc.  Anemia of chronic disease and iron deficiency-stable -s/p 1 unit PRBCs 8/29 -iron saturation 3% P: -Continue to avoid iron infusions due to concern for angioedema of unknown etiology -Transfusion goal hemoglobin 7 -Minimize blood draws as able  Hypertension-improved -Continue amlodipine 5 mg as ordered -Labetalol as needed  Type 2 DM, uncontrolled, but improving since glargine started P: -Continue same Lantus 30 -Scaling scale insulin as per protocol  Diarrhea on tube feeding P: FMS -Low suspicion C. difficile.  WBC stable. -Could consider C. difficile testing if clinical change  Best Practice / Goals of Care / Disposition.   Diet: Tube feeds restart when NG tube placed, will need PEG Pain/Anxiety/Delirium protocol (if indicated): Transitioned off continuous infusion, to IV pushes VAP protocol (if indicated): ordered DVT prophylaxis: heparin SQ GI prophylaxis: famotidine Glucose control: SSI, Lantus Mobility: Bed Code Status: Full Family Communication: Updated daily Disposition: ICU   Independent CC time 31 minutes   [place]

## 2019-08-16 NOTE — Plan of Care (Signed)
  Problem: Education: Goal: Knowledge of General Education information will improve Description: Including pain rating scale, medication(s)/side effects and non-pharmacologic comfort measures Outcome: Not Progressing   Problem: Health Behavior/Discharge Planning: Goal: Ability to manage health-related needs will improve Outcome: Not Progressing   Problem: Clinical Measurements: Goal: Ability to maintain clinical measurements within normal limits will improve Outcome: Not Progressing Goal: Will remain free from infection Outcome: Not Progressing Goal: Diagnostic test results will improve Outcome: Not Progressing Goal: Respiratory complications will improve Outcome: Not Progressing Goal: Cardiovascular complication will be avoided Outcome: Not Progressing   Problem: Activity: Goal: Risk for activity intolerance will decrease Outcome: Not Progressing   Problem: Nutrition: Goal: Adequate nutrition will be maintained Outcome: Not Progressing   Problem: Coping: Goal: Level of anxiety will decrease Outcome: Not Progressing   Problem: Elimination: Goal: Will not experience complications related to bowel motility Outcome: Not Progressing Goal: Will not experience complications related to urinary retention Outcome: Not Progressing   Problem: Pain Managment: Goal: General experience of comfort will improve Outcome: Not Progressing   Problem: Skin Integrity: Goal: Risk for impaired skin integrity will decrease Outcome: Not Progressing   Problem: Activity: Goal: Ability to tolerate increased activity will improve Outcome: Not Progressing   Problem: Respiratory: Goal: Ability to maintain a clear airway and adequate ventilation will improve Outcome: Not Progressing   Problem: Role Relationship: Goal: Method of communication will improve Outcome: Not Progressing   Problem: Education: Goal: Ability to manage disease process will improve Outcome: Not Progressing    Problem: Cardiac: Goal: Ability to achieve and maintain adequate cardiopulmonary perfusion will improve Outcome: Not Progressing   Problem: Neurologic: Goal: Promote progressive neurologic recovery Outcome: Not Progressing   Problem: Skin Integrity: Goal: Risk for impaired skin integrity will be minimized. Outcome: Not Progressing

## 2019-08-16 NOTE — Progress Notes (Signed)
Assisted tele visit to patient with family member.  Larry Presas McEachran, RN  

## 2019-08-16 NOTE — Progress Notes (Signed)
[facility]    NEPHROLOGY PROGRESS NOTE  SUBJECTIVE: Patient awake, unable to provide review of systems.  Discussed with nursing at bedside.    OBJECTIVE:  Vitals:   08/16/19 1100 08/16/19 1133  BP: 116/81   Pulse: (!) 122   Resp: (!) 24   Temp:  99.7 F (37.6 C)  SpO2: 100%     Intake/Output Summary (Last 24 hours) at 08/16/2019 1144 Last data filed at 08/16/2019 1100 Gross per 24 hour  Intake 1725 ml  Output 110 ml  Net 1615 ml      General: Not interactive, no acute distress, eyes open HEENT: MMM [place] AT anicteric sclera Neck:  No JVD, no adenopathy CV:  Heart RRR, right IJ TDC Lungs:  L/S CTA bilaterally Abd:  abd SNT/ND with normal BS GU:  Bladder non-palpable, positive Foley Extremities:  No LE edema. Skin:  No skin rash  MEDICATIONS:  . amLODipine  5 mg Per Tube Daily  . calcium acetate (Phos Binder)  1,334 mg Per Tube TID WC  . chlorhexidine gluconate (MEDLINE KIT)  15 mL Mouth Rinse BID  . Chlorhexidine Gluconate Cloth  6 each Topical Daily  . darbepoetin (ARANESP) injection - DIALYSIS  60 mcg Intravenous Q Tue-HD  . famotidine  10 mg Per Tube BID  . feeding supplement (NEPRO CARB STEADY)  1,000 mL Per Tube Q24H  . feeding supplement (PRO-STAT SUGAR FREE 64)  60 mL Per Tube BID  . fentaNYL (SUBLIMAZE) injection  200 mcg Intravenous Once  . heparin  5,000 Units Subcutaneous Q8H  . insulin aspart  0-20 Units Subcutaneous Q4H  . insulin glargine  30 Units Subcutaneous Daily  . mouth rinse  15 mL Mouth Rinse 10 times per day  . sodium chloride flush  3 mL Intravenous Q12H       LABS:   CBC Latest Ref Rng & Units 08/15/2019 08/14/2019 08/13/2019  WBC 4.0 - 10.5 K/uL 15.4(H) 14.4(H) 15.6(H)  Hemoglobin 13.0 - 17.0 g/dL 9.8(L) 9.6(L) 9.0(L)  Hematocrit 39.0 - 52.0 % 31.6(L) 31.6(L) 30.2(L)  Platelets 150 - 400 K/uL 401(H) 406(H) 357    CMP Latest Ref Rng & Units 08/15/2019 08/14/2019 08/13/2019  Glucose 70 - 99 mg/dL 187(H) 177(H) 210(H)   BUN 6 - 20 mg/dL 128(H) 79(H) 136(H)  Creatinine 0.61 - 1.24 mg/dL 8.77(H) 6.62(H) 8.29(H)  Sodium 135 - 145 mmol/L 139 137 137  Potassium 3.5 - 5.1 mmol/L 4.2 4.1 4.6  Chloride 98 - 111 mmol/L 99 96(L) 97(L)  CO2 22 - 32 mmol/L 20(L) 22 18(L)  Calcium 8.9 - 10.3 mg/dL 9.8 9.3 9.0  Total Protein 6.5 - 8.1 g/dL - - -  Total Bilirubin 0.3 - 1.2 mg/dL - - -  Alkaline Phos 38 - 126 U/L - - -  AST 15 - 41 U/L - - -  ALT 0 - 44 U/L - - -    Lab Results  Component Value Date   PTH 93 (H) 07/27/2019   CALCIUM 9.8 08/15/2019   CAION 1.00 (L) 08/03/2019   PHOS 7.1 (H) 08/15/2019       Component Value Date/Time   COLORURINE YELLOW 08/08/2019 0106   APPEARANCEUR CLEAR 08/08/2019 0106   LABSPEC 1.014 08/08/2019 0106   PHURINE 5.0 08/08/2019 0106   GLUCOSEU NEGATIVE 08/08/2019 0106   HGBUR MODERATE (A) 08/08/2019 0106   BILIRUBINUR NEGATIVE 08/08/2019 0106   KETONESUR NEGATIVE 08/08/2019 0106   PROTEINUR 100 (A) 08/08/2019 0106   NITRITE NEGATIVE 08/08/2019 0106  LEUKOCYTESUR NEGATIVE 08/08/2019 0106      Component Value Date/Time   PHART 7.424 08/03/2019 0651   PCO2ART 42.0 08/03/2019 0651   PO2ART 133.0 (H) 08/03/2019 0651   HCO3 27.5 08/03/2019 0651   TCO2 29 08/03/2019 0651   ACIDBASEDEF 1.3 07/19/2019 0755   O2SAT 99.0 08/03/2019 0651       Component Value Date/Time   IRON 8 (L) 07/08/2019 1336   TIBC 307 07/05/2019 1336   IRONPCTSAT 3 (L) 07/11/2019 1336       ASSESSMENT/PLAN:     1. ESRD THS Via R IJ TDC with [facility] 2. S/p cardiac arrest 3. S/p PD cath placement 8/27 - continue to flush with HD 4. VDRF - oxygenating well on vent 5. Anoxic brain injury - continue full support per family. 6. Hyperphosphatemia on binders 7. Fevers/Leukocytsois, B Cx NGTD, CEfepime/Vanc; has defervesced 8. Anemia of CKD.  Hgb stable.  Iron low.  Will plan IV iron once infection clears  Okay to remove Foley from renal perspective.   [place], DO, [place]

## 2019-08-16 NOTE — Progress Notes (Signed)
Assisted tele visit to patient with son.  Larry Banks Ann, RN  

## 2019-08-17 DIAGNOSIS — G931 Anoxic brain damage, not elsewhere classified: Secondary | ICD-10-CM

## 2019-08-17 DIAGNOSIS — Z930 Tracheostomy status: Secondary | ICD-10-CM

## 2019-08-17 DIAGNOSIS — J9600 Acute respiratory failure, unspecified whether with hypoxia or hypercapnia: Secondary | ICD-10-CM

## 2019-08-17 LAB — CBC
HCT: 31.8 % — ABNORMAL LOW (ref 39.0–52.0)
Hemoglobin: 9.8 g/dL — ABNORMAL LOW (ref 13.0–17.0)
MCH: 27.9 pg (ref 26.0–34.0)
MCHC: 30.8 g/dL (ref 30.0–36.0)
MCV: 90.6 fL (ref 80.0–100.0)
Platelets: 467 10*3/uL — ABNORMAL HIGH (ref 150–400)
RBC: 3.51 MIL/uL — ABNORMAL LOW (ref 4.22–5.81)
RDW: 14.5 % (ref 11.5–15.5)
WBC: 14.7 10*3/uL — ABNORMAL HIGH (ref 4.0–10.5)
nRBC: 0 % (ref 0.0–0.2)

## 2019-08-17 LAB — CULTURE, BLOOD (ROUTINE X 2)
Culture: NO GROWTH
Culture: NO GROWTH
Special Requests: ADEQUATE
Special Requests: ADEQUATE

## 2019-08-17 LAB — GLUCOSE, CAPILLARY
Glucose-Capillary: 129 mg/dL — ABNORMAL HIGH (ref 70–99)
Glucose-Capillary: 134 mg/dL — ABNORMAL HIGH (ref 70–99)
Glucose-Capillary: 187 mg/dL — ABNORMAL HIGH (ref 70–99)
Glucose-Capillary: 193 mg/dL — ABNORMAL HIGH (ref 70–99)
Glucose-Capillary: 215 mg/dL — ABNORMAL HIGH (ref 70–99)
Glucose-Capillary: 218 mg/dL — ABNORMAL HIGH (ref 70–99)

## 2019-08-17 LAB — RENAL FUNCTION PANEL
Albumin: 2.9 g/dL — ABNORMAL LOW (ref 3.5–5.0)
Anion gap: 25 — ABNORMAL HIGH (ref 5–15)
BUN: 157 mg/dL — ABNORMAL HIGH (ref 6–20)
CO2: 19 mmol/L — ABNORMAL LOW (ref 22–32)
Calcium: 9.6 mg/dL (ref 8.9–10.3)
Chloride: 92 mmol/L — ABNORMAL LOW (ref 98–111)
Creatinine, Ser: 10.03 mg/dL — ABNORMAL HIGH (ref 0.61–1.24)
GFR calc Af Amer: 6 mL/min — ABNORMAL LOW (ref >60)
GFR calc non Af Amer: 5 mL/min — ABNORMAL LOW (ref >60)
Glucose, Bld: 208 mg/dL — ABNORMAL HIGH (ref 70–99)
Phosphorus: 9.2 mg/dL — ABNORMAL HIGH (ref 2.5–4.6)
Potassium: 5.3 mmol/L — ABNORMAL HIGH (ref 3.5–5.1)
Sodium: 136 mmol/L (ref 135–145)

## 2019-08-17 LAB — PROCALCITONIN: Procalcitonin: 7.29 ng/mL

## 2019-08-17 MED ORDER — SODIUM CHLORIDE 0.9 % IV SOLN
INTRAVENOUS | Status: DC | PRN
  Administered 2019-08-17: 250 mL via INTRAVENOUS
  Administered 2019-08-25: 09:00:00 1000 mL via INTRAVENOUS

## 2019-08-17 MED ORDER — NEPRO/CARBSTEADY PO LIQD
1000.0000 mL | ORAL | Status: AC
  Administered 2019-08-17 – 2019-08-19 (×3): 1000 mL
  Filled 2019-08-17: qty 1000

## 2019-08-17 MED ORDER — SODIUM ZIRCONIUM CYCLOSILICATE 10 G PO PACK
10.0000 g | PACK | Freq: Once | ORAL | Status: AC
  Administered 2019-08-17: 10 g via ORAL
  Filled 2019-08-17: qty 1

## 2019-08-17 MED ORDER — METOPROLOL TARTRATE 12.5 MG HALF TABLET
12.5000 mg | ORAL_TABLET | Freq: Two times a day (BID) | ORAL | Status: DC
  Administered 2019-08-17 – 2019-08-25 (×15): 12.5 mg via NASOGASTRIC
  Filled 2019-08-17 (×17): qty 1

## 2019-08-17 NOTE — Progress Notes (Signed)
Nutrition Follow-up  RD working remotely.  DOCUMENTATION CODES:   Not applicable  INTERVENTION:   Tube feeding: - Increase Nepro to 40 ml/hr (960 ml/day) via Cortrak - Pro-stat 60 ml BID  Tube feeding regimen provides 2128 kcal, 138 grams of protein, and 698 ml of H2O.   NUTRITION DIAGNOSIS:   Increased nutrient needs related to chronic illness (ESRD on dialysis) as evidenced by estimated needs.  Ongoing, being addressed via TF  GOAL:   Patient will meet greater than or equal to 90% of their needs  Met via TF  MONITOR:   Labs, Weight trends, TF tolerance, Skin, I & O's  REASON FOR ASSESSMENT:   Consult Enteral/tube feeding initiation and management  ASSESSMENT:   48 year old male with medical history significant for hypertension, PAD, hyperlipidemia, CKD-stage 5 presented to [facility] emergency room with respiratory distress/hypoxia. On arrival to ER suffered a cardiac arrest with asystole with ROSC in 15 minutes.  9/10 - s/p trach 9/11 - Cortrak placed, tip gastric per Cortrak team  Per CCM note, pt is tolerating trach collar today. Tongue swelling has decreased. Per CCM, if pt tolerates trach collar for 48 to 72 hours, can be discharged to SNF. Plan to consult IR for PEG tube placement.  Pt continues to receive iHD.  Weight is 20 lbs below admit weight. Suspect weight loss related in part to negative fluid balance. Will continue to monitor trends.  Per RN edema assessment, pt with non-pitting edema to BUE and BLE.  Current TF: Nepro @ 35 ml/hr, Pro-stat 60 ml BID  Medications reviewed and include: Phoslyra 1334 mg TID, Aranesp, Pepcid, SSI q 4 hours, Lantus 30 units daily, IV Keppra  Labs reviewed: hemoglobin 9.8, phosphorus 7.1, magnesium 2.9 CBG's: 129-224 x 24 hours  UOP: 65 ml x 24 hours Stool: 100 ml x 24 hours (rectal tube) I/O's: -12.4 L since admit  Diet Order:   Diet Order            Diet NPO time specified  Diet effective midnight               EDUCATION NEEDS:   Not appropriate for education at this time  Skin:  Skin Assessment: Skin Integrity Issues: Skin Integrity Issues: Other: pressure injury to tongue  Last BM:  08/16/19 rectal tube  Height:   Ht Readings from Last 1 Encounters:  08/03/19 '5\' 6"'$  (1.676 m)    Weight:   Wt Readings from Last 1 Encounters:  08/17/19 83.5 kg    Ideal Body Weight:  64.5 kg  BMI:  Body mass index is 29.71 kg/m.  Estimated Nutritional Needs:   Kcal:  2100-2300  Protein:  120-135 grams  Fluid:  UOP + 1000 ml    Gaynell Face, MS, RD, LDN Inpatient Clinical Dietitian Pager: [phone] Weekend/After Hours: [phone]

## 2019-08-17 NOTE — Progress Notes (Signed)
PULMONARY / CRITICAL CARE MEDICINE   NAME:  Larry Banks, MRN:  [id number], DOB:  Mar 02, 1971, LOS: 97 ADMISSION DATE:  07/10/2019, CONSULTATION DATE:  07/04/2019  REFERRING MD:  EDP Dr. Tomi Bamberger   CHIEF COMPLAINT:  Cardiac Arrest/Resp Failure   BRIEF HISTORY:    48 year old male with medical history significant for hypertension, PAD, hyperlipidemia, CKD Stage 5 developed shortness of breath at home.  Patient does have CKD Stage 5 .  Is followed at [facility].  Recently had peritoneal dialysis catheter placed on August 27. Has not started Dialysis yet.  On arrival to the emergency room on 8/29  patient suffered a cardiac arrest with asystole.  He received CPR x15 minutes.  He receieved hyperkalemic protocol due to his underlying chronic kidney disease with 1 amp bicarb, 1 amp D50, insulin 10 units regular IV.  He received epi x3.  He had ROSC after 15 minutes.  He did require very brief Neo-Synephrine for hypotension.  He had significant agitation was started on sedation with Versed and Fentanyl.    Patient was transported to [facility] ICU for [name] to admit.  Remains intubated without significant neurologic recovery. EEG and CT suggestive of severe anoxic brain injury  SIGNIFICANT PAST MEDICAL HISTORY   Hypertension, hyperlipidemia, PAD, chronic kidney disease stage V  SIGNIFICANT EVENTS:  8/26-  TTS HD - last 07/29/2019 [name] (old tunneled Rt HD cah) 8/27 - PD cath - for a planned change from iHD to PD 8/29- Cardiac arrest, CPR x15 minutes, epi x3. Per renal CXR- c/w pulmonary edema 8/31- 1 unit PRBC, paralyzed due to vent dysynchrony 9/1- off pressors 9/5 antibiotics restarted- fevers, leukocytosis, arrhythmias  STUDIES:   8/29 CT head  > no acute abnormality 8/29 CT abdomen pelvis > bilateral lower lobe consolidation with small pleural effusion.  Groundglass opacities lower portion of the lung.  Nonobstructive renal calculus, 8/29 Echo > ef 45-50%, hypokineses of the mid-inferior  and lateral wall 8/29 - EEG> diffuse slowing and generalized suppression suggestive of profound encephalopathy, no seizure activity 9/1 CT head>severe diffuse brain edema compatible with widespread hypoxic injury 9/2 EEG> suggestive ofprofound diffuse encephalopathy,could besecondary to anoxic/hypoxic brain injury.No seizures or epileptiform discharges were seen throughout the recording.  9/3 MRI brain>Diffuse hypoxic ischemic injury most pronounced affecting the cerebral hemispheric cortex but with some involvement also of the basal ganglia and thalami. 9/4 & 9/5: CXR- no opacities 9/13: CXR-no focal infiltrates or opacities.New feeding catheter extending into the stomach. No acute abnormality is seen.  CULTURES:  8/29 BC >>Negative  8/29 Sputum >> Rare GNR, GPC>> Normal Flora 8/29 COVID 19 >> NEG  MRSA surveillance negative  9/5 blood culture >>NGTD 9/5 urine culture >> negative 9/13 Sputum>> abundant GNR, rare GPC 9/13 Blood>>   ANTIBIOTICS:  Unasyn 8/29 >9/1 Cefepime 9/5> 9/11 vanc 9/5 > 9/9  LINES/TUBES:  Peritoneal cath 8/27 (PTA )  R HD cath ? >>  ------- 8/29 ETT >> 9/10 Trach (JY) 9/10 >>  8/29 L IJ CVL> 9/4 8/29 A line>>8/30 No foley. Unclear when D/ced.   CONSULTANTS:  Nephrology  Neurology  Subjective No neurological changes noted Tolerating PSV 5/5 Low grade fever this am, 100.60F Remains in sinus tach several days (since 9/10)  CONSTITUTIONAL: BP 116/79   Pulse (!) 121   Temp 100.3 F (37.9 C) (Oral)   Resp (!) 21   Ht 5\' 6"  (1.676 m)   Wt 83.5 kg   SpO2 100%   BMI 29.71 kg/m  I/O last 3 completed shifts: In: 1983 [NG/GT:1880; IV Piggyback:103] Out: 195 [Urine:95; Stool:100]     Vent Mode: CPAP;PSV FiO2 (%):  [40 %] 40 % Set Rate:  [10 bmp] 10 bmp Vt Set:  [500 mL] 500 mL PEEP:  [5 cmH20] 5 cmH20 Pressure Support:  [5 cmH20-10 cmH20] 5 cmH20 Plateau Pressure:  [14 cmH20-17 cmH20] 16 cmH20  PE General: Obese ill-appearing man,  well developed HENT: Normocephalic, PERRL, sluggish. Tongue remains enlarged  Neck: Trach site dried blood, no drainage  CV: RRR S1S2. No MRG  Lungs: BBS rhonchi, FNL, symmetrical. On PSV  ABD: Rounded, PD cath in place. + BS x4. SNT/ND GU: NO foley EXT: No edema Skin: PWD Neuro:Withdraws briskly in lower EXT, delayed withdrawal upper EXT. Does not move spontaneously.     RESOLVED PROBLEM LIST  NA ASSESSMENT AND PLAN   PEA out of hospital cardiac arrest with acute hypoxic respiratory failure and anoxic brain injury.  Etiology of arrest unknown. Transitioned from HD to PD and had missed dialysis the day of admission, presenting K was WNL. S/p 36 degree TTM.  EEG without seizure activity but profound diffuse encephalopathy. -Echo will moderate hypokinesis of the mid-inferior and lateral wall, new since echo in 2019 -MRI with restricted diffusion throughout the entire cortex. P: -Prolonged support desired by family to allow for possible neurological improvement.  Appreciate palliative care input -Continue standard trach care -Consult for PEG placement  -VAP prevention order set -Continue ventilator support to prevent eminent deterioration and further organ dysfunction from hypoxemia and hypercarbia.    Maintain SpO2 greater than or equal to 90%. Head of bed elevated 30 degrees. Plateau pressures less than 30 cm H20.  Follow chest x-ray, ABG prn.   SAT/SBT as tolerated. Bronchial hygiene. RT/bronchodilator protocol.    Sinus tachycardia: unclear cause.  Continues to appear comfortable. Per nurse wife he recently started something for his HR.   P Scheduled metoprolol   Rhythmic L hand movements:  P renal dosed keppra.    Sepsis of unknown etiology, resolved.  Low grade fever this am but improving overall.   No infiltrates on x-ray 9/13. Sputum and blood resent.  Remaining indwelling lines are long-term dialysis catheter and peritoneal catheter.  Urine culture 9/5 negative.   LFTs 9/7 reassuring -Vancomycin and cefepime completed, following clinically off antibiotics -His only non-HD access are 2 peripheral IVs. P Will d/w neph d/c peritoneal cath and possible HD cath change Check procal Follow up cultures Will repeat cxr and repeat pan cultures today.   ESRD stage 5, urinary retention requiring frequent straight catheterization. Hyperphosphatemia. -receiving HD at [facility], TTS -Peritoneal Cath placed on 8/27 to replace old R IJ P: -Hemodialysis as per nephrology plans -follow renal panel   Angioedema of tongue, improving -etiology unclear, no signs of systemic edema, rash or allergic reaction, no ACEI, on ARB at home P: -Continues to improve.  Continue to observe.   Anemia of chronic disease and iron deficiency-stable -s/p 1 unit PRBCs 8/29 -iron saturation 3% P: -Continue to avoid iron infusions due to concern for angioedema of unknown etiology -Transfusion goal hemoglobin 7 -Minimize blood draws as able  Hypertension-improved -Continue amlodipine 5 mg as ordered -metoprolol added for ST  Type 2 DM, uncontrolled, but improved P: -Continue same Lantus 30 -Scaling scale insulin as per protocol  Diarrhea on tube feeding P: FMS -Low suspicion C. difficile.  WBC stable. -Could consider C. difficile testing if clinical change   Best Practice / Goals of  Care / Disposition.   Diet: Tube feeds restart when NG tube placed, will need PEG Pain/Anxiety/Delirium protocol (if indicated): prn meds  VAP protocol (if indicated): ordered DVT prophylaxis: heparin SQ GI prophylaxis: famotidine Glucose control: SSI, Lantus Mobility: Bed Code Status: Full Family Communication: Updated daily Disposition: ICU     The patient is critically ill with VDRF. He requires ICU for high complexity decision making, titration of high alert medications, ventilator management, titration of oxygen and interpretation of advanced monitoring.    I personally spent  35 minutes providing critical care services including personally reviewing test results, discussing care with nursing staff/other physicians and completing orders pertaining to this patient.  Time was exclusive to the patient and does not include time spent teaching or in procedures.  Voice recognition software was used in the production of this record.  Errors in interpretation may have been inadvertently missed during review.  Francine Graven, MSN, AGACNP  Pager [phone] or if no answer [phone] [facility] Pulmonary & Critical Care

## 2019-08-17 NOTE — Progress Notes (Addendum)
Palliative:  HPI: 48 y.o.malewith past medical history of PAD, HLD, ESRD began HD 2 weeks prior to admission with plan to transition to peritoneal dialysis, peritoneal catheter placed 8/27admitted on 8/29/2020with SOB and PEA arrest en-route to ED with ROSC after 15 min down.Continues vent dependent and tolerating HD. Hospitalization complicated further by fevers/sepsis and angioedema of unknown etiology. Unfortunately MRI brain and neurological assessment concerning for extensive anoxic brain injury with poor prognosis for significant neurological recovery.Trach placed 9/10. Wean to trach collar 9/14. Plans for PEG placement in near future.   I have reviewed electronic records/notes and discussed assessment and concerns with bedside RN, Loma Sousa. No changes in neurological examination but he has weaned to trach collar today. I did reach out to wife, [name], via telephone. She has been to visit with [facility] today along with their son and has since left the hospital. She tells me that he was "sleeping" during their visit but she understands that he was more alert prior to her visit. She continues to discuss their hopes of improvement and that this could open up options. She is open to all available options to prolong life and give them time to witness this miracle and improvement at this time. We discussed likely need for removal of PD catheter in order to place PEG. She understands and wants PEG placed and is okay with removal of PD catheter if needed. She says that she has been told that there are no facilities in [place] that will provide peritoneal dialysis so she understands and agrees to continuing hemodialysis. She understands that if he remains stable and off ventilator and PEG is placed then the process may begin for placement and we may better know our options. She mentions interest in [place]? but I told her that we will need to work to figure out what our options are and she  understands. She had no further questions/concerns. Emotional support provided.   Plan: - Continue full aggressive care, full code.  - Wife continues to be hopeful for improvement and a miracle.   15 min  Vinie Sill, NP Palliative Medicine Team Pager [phone] (Please see [email or web address] for schedule) Team Phone [phone]    Greater than 50%  of this time was spent counseling and coordinating care related to the above assessment and plan   The above conversation was completed via telephone due to the visitor restrictions during the COVID-19 pandemic. Thorough chart review and discussion with necessary members of the care team was completed as part of assessment. All issues were discussed and addressed but no physical exam was performed.

## 2019-08-17 NOTE — Progress Notes (Addendum)
[facility]    NEPHROLOGY PROGRESS NOTE  SUBJECTIVE: Patient awake, unable to provide review of systems.  Discussed with nursing at bedside.    OBJECTIVE:  Vitals:   08/17/19 1129 08/17/19 1200  BP:  102/73  Pulse:  (!) 105  Resp:  (!) 25  Temp: 100.1 F (37.8 C)   SpO2:  98%    Intake/Output Summary (Last 24 hours) at 08/17/2019 1348 Last data filed at 08/17/2019 1300 Gross per 24 hour  Intake 942.26 ml  Output 125 ml  Net 817.26 ml      General: Not interactive, no acute distress, eyes open HEENT: MMM [place] AT anicteric sclera Neck:  No JVD, no adenopathy CV:  Heart RRR, right IJ TDC Lungs:  L/S CTA bilaterally Abd:  abd SNT/ND with normal BS GU:  Bladder non-palpable, positive Foley Extremities:  No LE edema. Skin:  No skin rash  MEDICATIONS:  . amLODipine  5 mg Per Tube Daily  . calcium acetate (Phos Binder)  1,334 mg Per Tube TID WC  . chlorhexidine gluconate (MEDLINE KIT)  15 mL Mouth Rinse BID  . Chlorhexidine Gluconate Cloth  6 each Topical Daily  . darbepoetin (ARANESP) injection - DIALYSIS  60 mcg Intravenous Q Tue-HD  . famotidine  10 mg Per Tube BID  . feeding supplement (NEPRO CARB STEADY)  1,000 mL Per Tube Q24H  . feeding supplement (PRO-STAT SUGAR FREE 64)  60 mL Per Tube BID  . fentaNYL (SUBLIMAZE) injection  200 mcg Intravenous Once  . heparin  5,000 Units Subcutaneous Q8H  . insulin aspart  0-20 Units Subcutaneous Q4H  . insulin glargine  30 Units Subcutaneous Daily  . mouth rinse  15 mL Mouth Rinse 10 times per day  . metoprolol tartrate  12.5 mg Per NG tube BID  . sodium chloride flush  3 mL Intravenous Q12H       LABS:   CBC Latest Ref Rng & Units 08/17/2019 08/15/2019 08/14/2019  WBC 4.0 - 10.5 K/uL 14.7(H) 15.4(H) 14.4(H)  Hemoglobin 13.0 - 17.0 g/dL 9.8(L) 9.8(L) 9.6(L)  Hematocrit 39.0 - 52.0 % 31.8(L) 31.6(L) 31.6(L)  Platelets 150 - 400 K/uL 467(H) 401(H) 406(H)    CMP Latest Ref Rng & Units 08/17/2019 08/16/2019  08/15/2019  Glucose 70 - 99 mg/dL 208(H) 177(H) 187(H)  BUN 6 - 20 mg/dL 157(H) 128(H) 128(H)  Creatinine 0.61 - 1.24 mg/dL 10.03(H) 7.90(H) 8.77(H)  Sodium 135 - 145 mmol/L 136 135 139  Potassium 3.5 - 5.1 mmol/L 5.3(H) 5.0 4.2  Chloride 98 - 111 mmol/L 92(L) 92(L) 99  CO2 22 - 32 mmol/L 19(L) 21(L) 20(L)  Calcium 8.9 - 10.3 mg/dL 9.6 9.7 9.8  Total Protein 6.5 - 8.1 g/dL - - -  Total Bilirubin 0.3 - 1.2 mg/dL - - -  Alkaline Phos 38 - 126 U/L - - -  AST 15 - 41 U/L - - -  ALT 0 - 44 U/L - - -    Lab Results  Component Value Date   PTH 93 (H) 07/26/2019   CALCIUM 9.6 08/17/2019   CAION 1.00 (L) 08/03/2019   PHOS 9.2 (H) 08/17/2019       Component Value Date/Time   COLORURINE YELLOW 08/08/2019 0106   APPEARANCEUR CLEAR 08/08/2019 0106   LABSPEC 1.014 08/08/2019 0106   PHURINE 5.0 08/08/2019 0106   GLUCOSEU NEGATIVE 08/08/2019 0106   HGBUR MODERATE (A) 08/08/2019 0106   BILIRUBINUR NEGATIVE 08/08/2019 0106   KETONESUR NEGATIVE 08/08/2019 0106   PROTEINUR  100 (A) 08/08/2019 0106   NITRITE NEGATIVE 08/08/2019 0106   LEUKOCYTESUR NEGATIVE 08/08/2019 0106      Component Value Date/Time   PHART 7.424 08/03/2019 0651   PCO2ART 42.0 08/03/2019 0651   PO2ART 133.0 (H) 08/03/2019 0651   HCO3 27.5 08/03/2019 0651   TCO2 29 08/03/2019 0651   ACIDBASEDEF 1.3 07/11/2019 0755   O2SAT 99.0 08/03/2019 0651       Component Value Date/Time   IRON 8 (L) 07/05/2019 1336   TIBC 307 07/30/2019 1336   IRONPCTSAT 3 (L) 07/08/2019 1336       ASSESSMENT/PLAN:     1. ESRD THS Via R IJ TDC with [facility].  Marked Bun elevation noted.  Will check post-bun tomorrow 2. S/p cardiac arrest 3. S/p PD cath placement 8/27 - continue to flush with HD -PD catheter will need to be removed if plan for PEG tube is there is an increased risk of infection.  Doubt any facility would be able to take him with a vent and peritoneal dialysis anyway.  Wife updated and is in agreement 4. VDRF - oxygenating well on  vent 5. Anoxic brain injury - continue full support per family. 6. Hyperphosphatemia on binders 7. Fevers/Leukocytsois, B Cx NGTD, s/p CEfepime/Vanc; has defervesced 8. Anemia of CKD.  Hgb stable.  Iron low.  Will plan IV iron once infection clears    [employer], DO, FACP

## 2019-08-17 NOTE — Plan of Care (Signed)
  Problem: Education: Goal: Knowledge of General Education information will improve Description: Including pain rating scale, medication(s)/side effects and non-pharmacologic comfort measures Outcome: Progressing   Problem: Health Behavior/Discharge Planning: Goal: Ability to manage health-related needs will improve Outcome: Progressing   Problem: Clinical Measurements: Goal: Ability to maintain clinical measurements within normal limits will improve Outcome: Progressing Goal: Will remain free from infection Outcome: Progressing Goal: Diagnostic test results will improve Outcome: Progressing Goal: Respiratory complications will improve Outcome: Progressing Goal: Cardiovascular complication will be avoided Outcome: Progressing   Problem: Activity: Goal: Risk for activity intolerance will decrease Outcome: Progressing   Problem: Nutrition: Goal: Adequate nutrition will be maintained Outcome: Progressing   Problem: Coping: Goal: Level of anxiety will decrease Outcome: Progressing   Problem: Elimination: Goal: Will not experience complications related to bowel motility Outcome: Progressing Goal: Will not experience complications related to urinary retention Outcome: Progressing   Problem: Pain Managment: Goal: General experience of comfort will improve Outcome: Progressing   Problem: Skin Integrity: Goal: Risk for impaired skin integrity will decrease Outcome: Progressing   Problem: Activity: Goal: Ability to tolerate increased activity will improve Outcome: Progressing   Problem: Respiratory: Goal: Ability to maintain a clear airway and adequate ventilation will improve Outcome: Progressing   Problem: Role Relationship: Goal: Method of communication will improve Outcome: Progressing   Problem: Education: Goal: Ability to manage disease process will improve Outcome: Progressing   Problem: Cardiac: Goal: Ability to achieve and maintain adequate  cardiopulmonary perfusion will improve Outcome: Progressing   Problem: Neurologic: Goal: Promote progressive neurologic recovery Outcome: Progressing   Problem: Skin Integrity: Goal: Risk for impaired skin integrity will be minimized. Outcome: Progressing

## 2019-08-17 NOTE — Progress Notes (Signed)
Assisted tele visit to patient with wife.  Larry Banks, [name], RN

## 2019-08-18 LAB — CBC
HCT: 30 % — ABNORMAL LOW (ref 39.0–52.0)
Hemoglobin: 9.7 g/dL — ABNORMAL LOW (ref 13.0–17.0)
MCH: 28.9 pg (ref 26.0–34.0)
MCHC: 32.3 g/dL (ref 30.0–36.0)
MCV: 89.3 fL (ref 80.0–100.0)
Platelets: 458 10*3/uL — ABNORMAL HIGH (ref 150–400)
RBC: 3.36 MIL/uL — ABNORMAL LOW (ref 4.22–5.81)
RDW: 14.2 % (ref 11.5–15.5)
WBC: 13.8 10*3/uL — ABNORMAL HIGH (ref 4.0–10.5)
nRBC: 0 % (ref 0.0–0.2)

## 2019-08-18 LAB — GLUCOSE, CAPILLARY
Glucose-Capillary: 174 mg/dL — ABNORMAL HIGH (ref 70–99)
Glucose-Capillary: 169 mg/dL — ABNORMAL HIGH (ref 70–99)
Glucose-Capillary: 219 mg/dL — ABNORMAL HIGH (ref 70–99)
Glucose-Capillary: 315 mg/dL — ABNORMAL HIGH (ref 70–99)
Glucose-Capillary: 165 mg/dL — ABNORMAL HIGH (ref 70–99)

## 2019-08-18 LAB — RENAL FUNCTION PANEL
Albumin: 2.8 g/dL — ABNORMAL LOW (ref 3.5–5.0)
Anion gap: 26 — ABNORMAL HIGH (ref 5–15)
BUN: 201 mg/dL — ABNORMAL HIGH (ref 6–20)
CO2: 19 mmol/L — ABNORMAL LOW (ref 22–32)
Calcium: 9.4 mg/dL (ref 8.9–10.3)
Chloride: 92 mmol/L — ABNORMAL LOW (ref 98–111)
Creatinine, Ser: 11.54 mg/dL — ABNORMAL HIGH (ref 0.61–1.24)
GFR calc Af Amer: 5 mL/min — ABNORMAL LOW (ref >60)
GFR calc non Af Amer: 5 mL/min — ABNORMAL LOW (ref >60)
Glucose, Bld: 180 mg/dL — ABNORMAL HIGH (ref 70–99)
Phosphorus: 10.8 mg/dL — ABNORMAL HIGH (ref 2.5–4.6)
Potassium: 4.8 mmol/L (ref 3.5–5.1)
Sodium: 137 mmol/L (ref 135–145)

## 2019-08-18 LAB — BUN: BUN: 60 mg/dL — ABNORMAL HIGH (ref 6–20)

## 2019-08-18 MED ORDER — INSULIN ASPART 100 UNIT/ML ~~LOC~~ SOLN
3.0000 [IU] | SUBCUTANEOUS | Status: DC
  Administered 2019-08-18 – 2019-08-20 (×10): 3 [IU] via SUBCUTANEOUS

## 2019-08-18 MED ORDER — INSULIN GLARGINE 100 UNIT/ML ~~LOC~~ SOLN: 35.0000 [IU] | Freq: Every day | SUBCUTANEOUS | Status: DC

## 2019-08-18 MED ORDER — SODIUM CHLORIDE 0.9 % IV SOLN: 100.0000 mL | INTRAVENOUS | Status: DC | PRN

## 2019-08-18 MED ORDER — INSULIN GLARGINE 100 UNIT/ML ~~LOC~~ SOLN
30.0000 [IU] | Freq: Every day | SUBCUTANEOUS | Status: DC
  Administered 2019-08-19 – 2019-08-24 (×4): 30 [IU] via SUBCUTANEOUS
  Filled 2019-08-18 (×8): qty 0.3

## 2019-08-18 MED ORDER — HEPARIN SODIUM (PORCINE) 1000 UNIT/ML DIALYSIS
1000.0000 [IU] | INTRAMUSCULAR | Status: DC | PRN
  Filled 2019-08-18 (×4): qty 1

## 2019-08-18 MED ORDER — ALTEPLASE 2 MG IJ SOLR: 2.0000 mg | Freq: Once | INTRAMUSCULAR | Status: DC | PRN

## 2019-08-18 NOTE — Progress Notes (Signed)
Assisted tele visit to patient with wife.  Maryelizabeth Rowan, RN

## 2019-08-18 NOTE — Care Management (Addendum)
Per report, patient's wife requesting LTACH at DC. Referrals made to [facility] and [facility].  [facility] declined. [facility] does not have any HD beds available this week, but they will continue to follow. Attempted to speak with wife, she was not in room, left VM requesting callback. Waiting to hear back.    Spoke w wife at bedside. Discussed above. She is interested in SNFs, discussed that he is not medically appropriate for SNF level of care and would need to transition through [facility]. Wife requesting that we also look into LTACHs available in [facility] as well, and recheck with [facility] next week, although I told her when they decline they do not later accept, and said , "We can try."   [facility] does not have HD beds.

## 2019-08-18 NOTE — Consult Note (Addendum)
[facility] Surgery Consult Note  Larry Banks [birth year]  [id number].    Requesting MD: Benedetto Coons Chief Complaint: Cardiac arrest Reason for Consult: PD catheter removal  HPI:   Patient is a 48 year old male with a history hypertension, diabetes, and end-stage renal disease.  He was brought to the ED with complaints of shortness of breath.  He was alert and oriented and able to walk to the truck when they picked him up.  On arrival to the ED he stood up of the truck went into cardiac arrest.  CPR was started.  He was intubated and resuscitated.  He was arrested with cardiac arrest preceded by worsening respiratory symptoms, volume overload pulmonary edema after a missed dialysis session.  He had recently just had a PD catheter placed on 07/30/2019.  Post CPR he developed encephalopathy and CT on 9/1 showed severe diffuse brain edema compatible with widespread hypoxic injury.  MRI 08/06/2019 shows diffuse hypoxic ischemic injury most pronounced affecting the cerebral hemispheric cortex but with some involvement of the basal ganglia and thalami.  He underwent tracheostomy on 08/13/2019.  He has been weaned down to a trach collar.  He has had some fevers that have resolved.  He remains unresponsive.  The family wants ongoing full code care.  They are hoping his encephalopathy will resolve with time.    We are requested to remove his PD catheter because he is no longer a candidate for PD dialysis.  He will remain on hemodialysis.  There was also a request for a PEG tube and currently the current PD catheter is considered a contraindication for PEG.   ROS: Review of Systems  Unable to perform ROS: Patient unresponsive    History reviewed. No pertinent family history.  Past Medical History:  Diagnosis Date  . Diabetes mellitus ([place])   . ESRD (end stage renal disease) on dialysis ([place])   . HLD (hyperlipidemia)   . HTN (hypertension)   . PAD (peripheral artery disease) ([place])      History reviewed. No pertinent surgical history.  Social History:  reports that he has never smoked. He has never used smokeless tobacco. No history on file for alcohol and drug.  Allergies:  Allergies  Allergen Reactions  . Iron Swelling    Severe tongue swelling/angioedema with Nulecit iron infusion  . Shellfish Allergy Nausea And Vomiting    Medications Prior to Admission  Medication Sig Dispense Refill  . acetaminophen (TYLENOL) 500 MG tablet Take 1,000 mg by mouth every 6 (six) hours as needed for mild pain.    Marland Kitchen amLODipine (NORVASC) 10 MG tablet Take 10 mg by mouth daily.    Marland Kitchen aspirin EC 81 MG tablet Take 81 mg by mouth daily.    Marland Kitchen atorvastatin (LIPITOR) 20 MG tablet Take 20 mg by mouth daily.    . B Complex-C (B-COMPLEX WITH VITAMIN C) tablet Take 1 tablet by mouth daily.    . calcium carbonate (TUMS - DOSED IN MG ELEMENTAL CALCIUM) 500 MG chewable tablet Chew 2 tablets by mouth See admin instructions. Take 2 tablets three times daily with meals and at bedtime.    . cholecalciferol (VITAMIN D3) 25 MCG (1000 UT) tablet Take 2,000 Units by mouth daily.    . furosemide (LASIX) 80 MG tablet Take 80 mg by mouth See admin instructions. Take 80mg  twice daily on non-dialysis days.    Marland Kitchen JANUVIA 25 MG tablet Take 25 mg by mouth daily.    . metolazone (ZAROXOLYN) 5 MG tablet  Take 5 mg by mouth See admin instructions. Take one tablet by mouth thirty minutes prior to Furosemide on non-dialysis days.      Blood pressure 106/83, pulse (!) 112, temperature 98.9 F (37.2 C), temperature source Oral, resp. rate 18, height 5\' 6"  (1.676 m), weight 82.8 kg, SpO2 100 %. Physical Exam: Physical Exam Constitutional:      General: He is not in acute distress.    Appearance: He is ill-appearing. He is not toxic-appearing or diaphoretic.     Comments: 48 year old black male on trach collar with anoxic brain injury.  He is nonresponsive.  HENT:     Head: Normocephalic and atraumatic.     Nose:      Comments: Core track feeding tube in place    Mouth/Throat:     Mouth: Mucous membranes are moist.     Pharynx: No oropharyngeal exudate.  Eyes:     General: No scleral icterus.    Comments: Pupils are equal  Neck:     Comments: Trach collar in place Cardiovascular:     Rate and Rhythm: Normal rate and regular rhythm.     Pulses: Normal pulses.     Heart sounds: No murmur.  Pulmonary:     Effort: Pulmonary effort is normal.     Breath sounds: Normal breath sounds.  Abdominal:     General: Abdomen is flat. There is no distension.     Palpations: Abdomen is soft. There is no mass.     Tenderness: There is no abdominal tenderness. There is no guarding or rebound.     Hernia: No hernia is present.     Comments: He has a PD catheter and a right lower quadrant.  Port sites for the PD catheter placement are all healing nicely.  Some of the glue still in place.  There is no erythema redness no peritonitis on exam.  Genitourinary:    Comments: Condom catheter in place Rectal tube in place -having loose stools on tube feeds Skin:    General: Skin is warm and dry.     Capillary Refill: Capillary refill takes less than 2 seconds.  Neurological:     Comments: Patient's unresponsive with anoxic brain injury.  Psychiatric:     Comments: Unresponsive     Results for orders placed or performed during the hospital encounter of 07/14/2019 (from the past 48 hour(s))  Expectorated sputum assessment w rflx to resp cult     Status: None   Collection Time: 08/16/19  3:29 PM   Specimen: Tracheal Aspirate; Sputum  Result Value Ref Range   Specimen Description TRACHEAL ASPIRATE    Special Requests NONE    Sputum evaluation      THIS SPECIMEN IS ACCEPTABLE FOR SPUTUM CULTURE Performed at [facility], [address], [place], [place] [address]    Report Status 08/16/2019 FINAL   Glucose, capillary     Status: Abnormal   Collection Time: 08/16/19  3:29 PM  Result Value Ref Range    Glucose-Capillary 186 (H) 70 - 99 mg/dL  Culture, respiratory     Status: None (Preliminary result)   Collection Time: 08/16/19  3:29 PM   Specimen: Tracheal Aspirate  Result Value Ref Range   Specimen Description TRACHEAL ASPIRATE    Special Requests NONE Reflexed from [id number]    Gram Stain      ABUNDANT SQUAMOUS EPITHELIAL CELLS PRESENT ABUNDANT WBC PRESENT, PREDOMINANTLY PMN ABUNDANT GRAM NEGATIVE RODS RARE GRAM POSITIVE COCCI    Culture  FEW Consistent with normal respiratory flora. Performed at [facility], [place] [address] [place], [place] [address]    Report Status PENDING   Basic metabolic panel     Status: Abnormal   Collection Time: 08/16/19  4:28 PM  Result Value Ref Range   Sodium 135 135 - 145 mmol/L   Potassium 5.0 3.5 - 5.1 mmol/L   Chloride 92 (L) 98 - 111 mmol/L   CO2 21 (L) 22 - 32 mmol/L   Glucose, Bld 177 (H) 70 - 99 mg/dL   BUN 128 (H) 6 - 20 mg/dL   Creatinine, Ser 7.90 (H) 0.61 - 1.24 mg/dL   Calcium 9.7 8.9 - 10.3 mg/dL   GFR calc non Af Amer 7 (L) >60 mL/min   GFR calc Af Amer 8 (L) >60 mL/min   Anion gap 22 (H) 5 - 15    Comment: Performed at [facility] [address], [place], [place] [address]  Culture, blood (routine x 2)     Status: None (Preliminary result)   Collection Time: 08/16/19  4:28 PM   Specimen: BLOOD  Result Value Ref Range   Specimen Description BLOOD LEFT ANTECUBITAL    Special Requests AEROBIC BOTTLE ONLY Blood Culture adequate volume    Culture      NO GROWTH 2 DAYS Performed at [facility], [place] [address], [place], [place] [address]    Report Status PENDING   Culture, blood (routine x 2)     Status: None (Preliminary result)   Collection Time: 08/16/19  4:39 PM   Specimen: BLOOD  Result Value Ref Range   Specimen Description BLOOD BLOOD RIGHT HAND    Special Requests AEROBIC BOTTLE ONLY Blood Culture adequate volume    Culture      NO GROWTH 2 DAYS Performed at [facility], [place]  [address], [place], [place] [address]    Report Status PENDING   Glucose, capillary     Status: Abnormal   Collection Time: 08/16/19  7:50 PM  Result Value Ref Range   Glucose-Capillary 185 (H) 70 - 99 mg/dL  Glucose, capillary     Status: Abnormal   Collection Time: 08/16/19 11:44 PM  Result Value Ref Range   Glucose-Capillary 162 (H) 70 - 99 mg/dL  Glucose, capillary     Status: Abnormal   Collection Time: 08/17/19  3:59 AM  Result Value Ref Range   Glucose-Capillary 129 (H) 70 - 99 mg/dL  Glucose, capillary     Status: Abnormal   Collection Time: 08/17/19  7:25 AM  Result Value Ref Range   Glucose-Capillary 215 (H) 70 - 99 mg/dL  CBC     Status: Abnormal   Collection Time: 08/17/19  7:43 AM  Result Value Ref Range   WBC 14.7 (H) 4.0 - 10.5 K/uL   RBC 3.51 (L) 4.22 - 5.81 MIL/uL   Hemoglobin 9.8 (L) 13.0 - 17.0 g/dL   HCT 31.8 (L) 39.0 - 52.0 %   MCV 90.6 80.0 - 100.0 fL   MCH 27.9 26.0 - 34.0 pg   MCHC 30.8 30.0 - 36.0 g/dL   RDW 14.5 11.5 - 15.5 %   Platelets 467 (H) 150 - 400 K/uL   nRBC 0.0 0.0 - 0.2 %    Comment: Performed at [facility], [place]. [address], [place], [place] [address]  Renal function panel     Status: Abnormal   Collection Time: 08/17/19  7:43 AM  Result Value Ref Range   Sodium 136 135 - 145 mmol/L   Potassium 5.3 (H) 3.5 - 5.1 mmol/L   Chloride 92 (L) 98 - 111 mmol/L   CO2 19 (L) 22 - 32 mmol/L   Glucose, Bld 208 (H) 70 - 99 mg/dL   BUN 157 (H) 6 - 20 mg/dL   Creatinine, Ser 10.03 (H) 0.61 - 1.24 mg/dL   Calcium 9.6 8.9 - 10.3 mg/dL   Phosphorus 9.2 (H) 2.5 - 4.6 mg/dL   Albumin 2.9 (L) 3.5 - 5.0 g/dL   GFR calc non Af Amer 5 (L) >60 mL/min   GFR calc Af Amer 6 (L) >60 mL/min   Anion gap 25 (H) 5 - 15    Comment: RESULT CHECKED Performed at [facility], [address] [place],  [address]   Procalcitonin - Baseline     Status: None   Collection Time: 08/17/19  7:51 AM  Result Value Ref Range   Procalcitonin 7.29 ng/mL     Comment:        Interpretation: PCT > 2 ng/mL: Systemic infection (sepsis) is likely, unless other causes are known. (NOTE)       Sepsis PCT Algorithm           Lower Respiratory Tract                                      Infection PCT Algorithm    ----------------------------     ----------------------------         PCT < 0.25 ng/mL                PCT < 0.10 ng/mL         Strongly encourage             Strongly discourage   discontinuation of antibiotics    initiation of antibiotics    ----------------------------     -----------------------------       PCT 0.25 - 0.50 ng/mL            PCT 0.10 - 0.25 ng/mL               OR       >80% decrease in PCT            Discourage initiation of                                            antibiotics      Encourage discontinuation           of antibiotics    ----------------------------     -----------------------------         PCT >= 0.50 ng/mL              PCT 0.26 - 0.50 ng/mL               AND       <80% decrease in PCT              Encourage initiation of                                             antibiotics  Encourage continuation           of antibiotics    ----------------------------     -----------------------------        PCT >= 0.50 ng/mL                  PCT > 0.50 ng/mL               AND         increase in PCT                  Strongly encourage                                      initiation of antibiotics    Strongly encourage escalation           of antibiotics                                     -----------------------------                                           PCT <= 0.25 ng/mL                                                 OR                                        > 80% decrease in PCT                                     Discontinue / Do not initiate                                             antibiotics Performed at [facility], [address] [place], [place] [address]   Glucose, capillary      Status: Abnormal   Collection Time: 08/17/19 11:22 AM  Result Value Ref Range   Glucose-Capillary 187 (H) 70 - 99 mg/dL  Glucose, capillary     Status: Abnormal   Collection Time: 08/17/19  3:44 PM  Result Value Ref Range   Glucose-Capillary 134 (H) 70 - 99 mg/dL  Glucose, capillary     Status: Abnormal   Collection Time: 08/17/19  7:43 PM  Result Value Ref Range   Glucose-Capillary 193 (H) 70 - 99 mg/dL  Glucose, capillary     Status: Abnormal   Collection Time: 08/17/19 11:35 PM  Result Value Ref Range   Glucose-Capillary 218 (H) 70 - 99 mg/dL  CBC     Status: Abnormal   Collection Time: 08/18/19  2:56 AM  Result Value Ref Range   WBC 13.8 (H) 4.0 - 10.5 K/uL   RBC 3.36 (L) 4.22 - 5.81 MIL/uL   Hemoglobin 9.7 (  L) 13.0 - 17.0 g/dL   HCT 30.0 (L) 39.0 - 52.0 %   MCV 89.3 80.0 - 100.0 fL   MCH 28.9 26.0 - 34.0 pg   MCHC 32.3 30.0 - 36.0 g/dL   RDW 14.2 11.5 - 15.5 %   Platelets 458 (H) 150 - 400 K/uL   nRBC 0.0 0.0 - 0.2 %    Comment: Performed at [facility] [address] [place], [place] [address]  Renal function panel     Status: Abnormal   Collection Time: 08/18/19  2:56 AM  Result Value Ref Range   Sodium 137 135 - 145 mmol/L   Potassium 4.8 3.5 - 5.1 mmol/L   Chloride 92 (L) 98 - 111 mmol/L   CO2 19 (L) 22 - 32 mmol/L   Glucose, Bld 180 (H) 70 - 99 mg/dL   BUN 201 (H) 6 - 20 mg/dL   Creatinine, Ser 11.54 (H) 0.61 - 1.24 mg/dL   Calcium 9.4 8.9 - 10.3 mg/dL   Phosphorus 10.8 (H) 2.5 - 4.6 mg/dL   Albumin 2.8 (L) 3.5 - 5.0 g/dL   GFR calc non Af Amer 5 (L) >60 mL/min   GFR calc Af Amer 5 (L) >60 mL/min   Anion gap 26 (H) 5 - 15    Comment: Performed at [facility], [address], [place], [place] [address]  Glucose, capillary     Status: Abnormal   Collection Time: 08/18/19  4:09 AM  Result Value Ref Range   Glucose-Capillary 174 (H) 70 - 99 mg/dL  Glucose, capillary     Status: Abnormal   Collection Time: 08/18/19  7:26 AM  Result Value  Ref Range   Glucose-Capillary 169 (H) 70 - 99 mg/dL  Glucose, capillary     Status: Abnormal   Collection Time: 08/18/19 11:53 AM  Result Value Ref Range   Glucose-Capillary 219 (H) 70 - 99 mg/dL  BUN     Status: Abnormal   Collection Time: 08/18/19 12:08 PM  Result Value Ref Range   BUN 60 (H) 6 - 20 mg/dL    Comment: Performed at [facility], [place] [address], [place], [place] [address]   Dg Chest Port 1 View  Result Date: 08/16/2019 CLINICAL DATA:  Fevers EXAM: PORTABLE CHEST 1 VIEW COMPARISON:  08/14/2019 FINDINGS: Tracheostomy tube, feeding catheter and dialysis catheter are noted. Feeding catheter is new from the prior exam. The tip is not visualized on this exam. The lungs are clear. Cardiac shadow is stable. Aortic calcifications are again seen. No bony abnormality is noted. IMPRESSION: New feeding catheter extending into the stomach. No acute abnormality is seen. Electronically Signed   By: Inez Catalina M.D.   On: 08/16/2019 19:09   . sodium chloride 250 mL (08/16/19 1736)  . sodium chloride    . sodium chloride    . sodium chloride    . sodium chloride    . sodium chloride Stopped (08/18/19 1031)  . sodium chloride    . sodium chloride    . levETIRAcetam Stopped (08/18/19 0531)   Anti-infectives (From admission, onward)   Start     Dose/Rate Route Frequency Ordered Stop   08/12/19 1200  ceFEPIme (MAXIPIME) 1 g in sodium chloride 0.9 % 100 mL IVPB     1 g 200 mL/hr over 30 Minutes Intravenous Every 24 hours 08/12/19 0930 08/14/19 1142   08/12/19 1147  ceFEPIme (MAXIPIME) 1 g in sodium chloride 0.9 % 100 mL  IVPB  Status:  Discontinued     1 g 200 mL/hr over 30 Minutes Intravenous Every 24 hours 08/11/19 1603 08/12/19 0930   08/11/19 1200  vancomycin (VANCOCIN) IVPB 1000 mg/200 mL premix  Status:  Discontinued     1,000 mg 200 mL/hr over 60 Minutes Intravenous Every T-Th-Sa (Hemodialysis) 08/08/19 1054 08/12/19 0857   08/08/19 1300  vancomycin (VANCOCIN) 2,000 mg in  sodium chloride 0.9 % 500 mL IVPB     2,000 mg 250 mL/hr over 120 Minutes Intravenous  Once 08/08/19 1054 08/08/19 1632   08/08/19 1200  ceFEPIme (MAXIPIME) 2 g in sodium chloride 0.9 % 100 mL IVPB  Status:  Discontinued     2 g 200 mL/hr over 30 Minutes Intravenous Every T-Th-Sa (Hemodialysis) 08/08/19 1054 08/11/19 1603   08/02/2019 1115  ampicillin-sulbactam (UNASYN) 1.5 g in sodium chloride 0.9 % 100 mL IVPB  Status:  Discontinued     1.5 g 200 mL/hr over 30 Minutes Intravenous Every 12 hours 08/02/2019 1104 08/05/19 0846        Assessment/Plan PEA cardiac arrest with anoxic brain injury Acute hypoxic respiratory failure Angioedema of the tongue Sepsis End-stage renal disease -on hemodialysis now Type 2 diabetes Hypertension Hyperlipidemia  Request removal of PD catheter/possible gastrostomy tube placement  FEN: Tube feeding/currently n.p.o. ID: Currently off antibiotics DVT: Heparin Follow-up: To be determined POC: [facility] Spouse [id number]  [phone]    Plan: Dr. Hollie Salk believes that the PD catheter should be removed, and a PEG placement should be placed.  Will discuss with Dr. Lucia Gaskins.  Earnstine Regal [facility] Surgery 08/18/2019, 1:20 PM Pager: [phone] Consults: [phone]  Agree with above.  Alphonsa Overall, MD, [facility] Surgery Pager: [phone] Office phone:  [phone]

## 2019-08-18 NOTE — Progress Notes (Signed)
ELINK attempted video visit per wife's request, but pt's wife did not pick up. Will try again later.

## 2019-08-18 NOTE — Progress Notes (Signed)
PULMONARY / CRITICAL CARE MEDICINE   NAME:  Larry Banks, MRN:  [id number], DOB:  Dec 27, 1970, LOS: 42 ADMISSION DATE:  07/30/2019, CONSULTATION DATE:  07/07/2019  REFERRING MD:  EDP Dr. Tomi Bamberger   CHIEF COMPLAINT:  Cardiac Arrest/Resp Failure   BRIEF HISTORY:    48 year old male with medical history significant for hypertension, PAD, hyperlipidemia, CKD Stage 5 developed shortness of breath at home.  Patient does have CKD Stage 5 .  Is followed at [facility].  Recently had peritoneal dialysis catheter placed on August 27. Has not started Dialysis yet.  On arrival to the emergency room on 8/29  patient suffered a cardiac arrest with asystole.  He received CPR x15 minutes.  He receieved hyperkalemic protocol due to his underlying chronic kidney disease with 1 amp bicarb, 1 amp D50, insulin 10 units regular IV.  He received epi x3.  He had ROSC after 15 minutes.  He did require very brief Neo-Synephrine for hypotension.  He had significant agitation was started on sedation with Versed and Fentanyl.    Patient was transported to [facility] ICU for [name] to admit.  Remains intubated without significant neurologic recovery. EEG and CT suggestive of severe anoxic brain injury  SIGNIFICANT PAST MEDICAL HISTORY   Hypertension, hyperlipidemia, PAD, chronic kidney disease stage V  SIGNIFICANT EVENTS:  8/26-  TTS HD - last 07/29/2019 [name] (old tunneled Rt HD cah) 8/27 - PD cath - for a planned change from iHD to PD 8/29- Cardiac arrest, CPR x15 minutes, epi x3. Per renal CXR- c/w pulmonary edema 8/31- 1 unit PRBC, paralyzed due to vent dysynchrony 9/1- off pressors 9/5 antibiotics restarted- fevers, leukocytosis, arrhythmias  STUDIES:   8/29 CT head  > no acute abnormality 8/29 CT abdomen pelvis > bilateral lower lobe consolidation with small pleural effusion.  Groundglass opacities lower portion of the lung.  Nonobstructive renal calculus, 8/29 Echo > ef 45-50%, hypokineses of the mid-inferior  and lateral wall 8/29 - EEG> diffuse slowing and generalized suppression suggestive of profound encephalopathy, no seizure activity 9/1 CT head>severe diffuse brain edema compatible with widespread hypoxic injury 9/2 EEG> suggestive ofprofound diffuse encephalopathy,could besecondary to anoxic/hypoxic brain injury.No seizures or epileptiform discharges were seen throughout the recording.  9/3 MRI brain>Diffuse hypoxic ischemic injury most pronounced affecting the cerebral hemispheric cortex but with some involvement also of the basal ganglia and thalami. 9/4 & 9/5: CXR- no opacities 9/13: CXR-no focal infiltrates or opacities.New feeding catheter extending into the stomach. No acute abnormality is seen.  CULTURES:  8/29 BC >>Negative  8/29 Sputum >> Rare GNR, GPC>> Normal Flora 8/29 COVID 19 >> NEG  MRSA surveillance negative  9/5 blood culture >>NGTD 9/5 urine culture >> negative 9/13 Sputum>> abundant GNR, rare GPC 9/13 Blood>>   ANTIBIOTICS:  Unasyn 8/29 >9/1 Cefepime 9/5> 9/11 vanc 9/5 > 9/9  LINES/TUBES:  Peritoneal cath 8/27 (PTA )  R HD cath ? >>  ------- 8/29 ETT >> 9/10 Trach (JY) 9/10 >>  8/29 L IJ CVL> 9/4 8/29 A line>>8/30 No foley. Unclear when D/ced.   CONSULTANTS:  Nephrology  Neurology  Subjective Remains unresponsive Diaphoretic Tolerating ATC at 28% x 24 hours T max last 24 is 100.3. leukocytosis Thick white secretions requiring suctioning every 2-3 hours Remains in sinus tach several days (since 9/10) with PVC's per tele  CONSTITUTIONAL: BP 113/82   Pulse (!) 107   Temp 99 F (37.2 C) (Oral)   Resp (!) 24   Ht 5\' 6"  (1.676 m)  Wt 82.8 kg   SpO2 100%   BMI 29.46 kg/m   I/O last 3 completed shifts: In: 1831.4 [I.V.:172; Other:140; NG/GT:1210.3; IV Piggyback:309] Out: 70 [Urine:70]     FiO2 (%):  [25 %-35 %] 28 %  PE General: Obese ill-appearing man, well developed, trached, unresponsive on ATC 28% HENT: Normocephalic, PERRL,  sluggish. Tongue remains enlarged  Neck: Lurline Idol is secure, Trach site dried blood, no drainage  CV: Irr  S1S2. No MRG  Lungs: Bilateral chest excursion,  rhonchi, ATC at 28%,sats 100% ABD: Rounded, PD cath in place. + BS x4. SNT/ND, obese GU: No foley EXT: No obvious deformities, No edema, brisk cap refill Skin: Dry and intact, warm to touch, some diaphoresis noted Neuro:Withdraws briskly in lower EXT to pain, delayed withdrawal upper EXT. Does not move spontaneously. Downward midline gaze    RESOLVED PROBLEM LIST  NA ASSESSMENT AND PLAN   PEA out of hospital cardiac arrest with acute hypoxic respiratory failure and anoxic brain injury.  Etiology of arrest unknown. Transitioned from HD to PD and had missed dialysis the day of admission, presenting K was WNL. S/p 36 degree TTM.  EEG without seizure activity but profound diffuse encephalopathy. -Echo will moderate hypokinesis of the mid-inferior and lateral wall, new since echo in 2019 -MRI with restricted diffusion throughout the entire cortex. P: -Prolonged support desired by family to allow for possible neurological improvement.  Appreciate palliative care input -Continue standard trach care -Consult for PEG placement >> General surgery consulted to place when  [facility] is removed. If they are unable, will need to be done in IR -VAP prevention order set Maintain SpO2 greater than or equal to 90%. Head of bed elevated 30 degrees.  Follow chest x-ray, ABG prn.   Bronchial hygiene. RT/bronchodilator protocol.    Sinus tachycardia: unclear cause.  Continues to appear comfortable. Per nurse wife he recently started something for his HR.   P Tele Scheduled metoprolol Trend Mag>> Goal > 2   Rhythmic L hand movements:  P renal dosed keppra.    Sepsis of unknown etiology, resolved.  Low grade fever this am but improving overall.   No infiltrates on x-ray 9/13. Sputum and blood resent.  Remaining indwelling lines are long-term dialysis  catheter and peritoneal catheter.  Urine culture 9/5 negative.  LFTs 9/7 reassuring -Vancomycin and cefepime completed, following clinically off antibiotics -His only non-HD access are 2 peripheral IVs. P Renal placed order for [facility] cath to be removed Trend PCT Blood and respiratory cultures  9/13 Follow micro Trend WBC and fever curve    ESRD stage 5, urinary retention requiring frequent straight catheterization. Hyperphosphatemia. -receiving HD at [facility], TTS -Peritoneal Cath placed on 8/27 to replace old R IJ P: -Hemodialysis as per nephrology plans -follow renal panel -Plans to remove [place] per general surgery   Angioedema of tongue, improving -etiology unclear, no signs of systemic edema, rash or allergic reaction, no ACEI, on ARB at home P: -Continues to improve.  Continue to observe.   Anemia of chronic disease and iron deficiency-stable -s/p 1 unit PRBCs 8/29 -iron saturation 3% P: -Continue to avoid iron infusions due to concern for angioedema of unknown etiology -Transfusion goal hemoglobin 7 -Minimize blood draws as able  Hypertension-improved -Continue amlodipine 5 mg as ordered -metoprolol added for ST  Type 2 DM, uncontrolled, but improved P: -Continue same Lantus 30 -Scaling scale insulin as per protocol  Diarrhea on tube feeding P: FMS -Low suspicion C. difficile.  WBC stable. -Could  consider C. difficile testing if clinical change   Best Practice / Goals of Care / Disposition.   Diet: Tube feeds restart when NG tube placed, will need PEG Pain/Anxiety/Delirium protocol (if indicated): prn meds  VAP protocol (if indicated): ordered DVT prophylaxis: heparin SQ GI prophylaxis: famotidine Glucose control: SSI, Lantus Mobility: Bed Code Status: Full Family Communication: No family at bedside 9/15 am Disposition: ICU  Consider transition to progressive unit in next 24 hours. Currently requiring suctioning every 2-3 hours of thick white  secretions   The patient is critically ill with VDRF. He requires ICU for high complexity decision making, titration of high alert medications, ventilator management, titration of oxygen and interpretation of advanced monitoring.    I personally spent 20 minutes providing critical care services including personally reviewing test results, discussing care with nursing staff/other physicians and completing orders pertaining to this patient.  Time was exclusive to the patient and does not include time spent teaching or in procedures.  Magdalen Spatz,  MSN, AGACNP  Pager [phone]  or if no answer [phone] [facility] 08/18/2019 10:06 AM

## 2019-08-18 NOTE — Progress Notes (Addendum)
[facility]    NEPHROLOGY PROGRESS NOTE  SUBJECTIVE: Sleeping this AM.  On HD.  Sweaty.  Still has PD cath but needs PEG placed.     OBJECTIVE:  Vitals:   08/18/19 1015 08/18/19 1030  BP: 104/76 108/75  Pulse: 100 100  Resp:    Temp:    SpO2:      Intake/Output Summary (Last 24 hours) at 08/18/2019 1038 Last data filed at 08/18/2019 0800 Gross per 24 hour  Intake 1273.35 ml  Output 70 ml  Net 1203.35 ml      General: NAD HEENT: eyes closed Neck:  No JVD CV:  Heart RRR, right IJ TDC Lungs:  L/S CTA bilaterally Abd:  abd SNT/ND with normal BS Extremities:  No LE edema. Skin:  No skin rash  MEDICATIONS:  . amLODipine  5 mg Per Tube Daily  . calcium acetate (Phos Binder)  1,334 mg Per Tube TID WC  . chlorhexidine gluconate (MEDLINE KIT)  15 mL Mouth Rinse BID  . Chlorhexidine Gluconate Cloth  6 each Topical Daily  . darbepoetin (ARANESP) injection - DIALYSIS  60 mcg Intravenous Q Tue-HD  . famotidine  10 mg Per Tube BID  . feeding supplement (NEPRO CARB STEADY)  1,000 mL Per Tube Q24H  . feeding supplement (PRO-STAT SUGAR FREE 64)  60 mL Per Tube BID  . fentaNYL (SUBLIMAZE) injection  200 mcg Intravenous Once  . heparin  5,000 Units Subcutaneous Q8H  . insulin aspart  0-20 Units Subcutaneous Q4H  . insulin glargine  30 Units Subcutaneous Daily  . mouth rinse  15 mL Mouth Rinse 10 times per day  . metoprolol tartrate  12.5 mg Per NG tube BID  . sodium chloride flush  3 mL Intravenous Q12H       LABS:   CBC Latest Ref Rng & Units 08/18/2019 08/17/2019 08/15/2019  WBC 4.0 - 10.5 K/uL 13.8(H) 14.7(H) 15.4(H)  Hemoglobin 13.0 - 17.0 g/dL 9.7(L) 9.8(L) 9.8(L)  Hematocrit 39.0 - 52.0 % 30.0(L) 31.8(L) 31.6(L)  Platelets 150 - 400 K/uL 458(H) 467(H) 401(H)    CMP Latest Ref Rng & Units 08/18/2019 08/17/2019 08/16/2019  Glucose 70 - 99 mg/dL 180(H) 208(H) 177(H)  BUN 6 - 20 mg/dL 201(H) 157(H) 128(H)  Creatinine 0.61 - 1.24 mg/dL 11.54(H) 10.03(H)  7.90(H)  Sodium 135 - 145 mmol/L 137 136 135  Potassium 3.5 - 5.1 mmol/L 4.8 5.3(H) 5.0  Chloride 98 - 111 mmol/L 92(L) 92(L) 92(L)  CO2 22 - 32 mmol/L 19(L) 19(L) 21(L)  Calcium 8.9 - 10.3 mg/dL 9.4 9.6 9.7  Total Protein 6.5 - 8.1 g/dL - - -  Total Bilirubin 0.3 - 1.2 mg/dL - - -  Alkaline Phos 38 - 126 U/L - - -  AST 15 - 41 U/L - - -  ALT 0 - 44 U/L - - -    Lab Results  Component Value Date   PTH 93 (H) 07/30/2019   CALCIUM 9.4 08/18/2019   CAION 1.00 (L) 08/03/2019   PHOS 10.8 (H) 08/18/2019       Component Value Date/Time   COLORURINE YELLOW 08/08/2019 0106   APPEARANCEUR CLEAR 08/08/2019 0106   LABSPEC 1.014 08/08/2019 0106   PHURINE 5.0 08/08/2019 0106   GLUCOSEU NEGATIVE 08/08/2019 0106   HGBUR MODERATE (A) 08/08/2019 0106   BILIRUBINUR NEGATIVE 08/08/2019 0106   KETONESUR NEGATIVE 08/08/2019 0106   PROTEINUR 100 (A) 08/08/2019 0106   NITRITE NEGATIVE 08/08/2019 0106   LEUKOCYTESUR NEGATIVE 08/08/2019 0106  Component Value Date/Time   PHART 7.424 08/03/2019 0651   PCO2ART 42.0 08/03/2019 0651   PO2ART 133.0 (H) 08/03/2019 0651   HCO3 27.5 08/03/2019 0651   TCO2 29 08/03/2019 0651   ACIDBASEDEF 1.3 07/30/2019 0755   O2SAT 99.0 08/03/2019 0651       Component Value Date/Time   IRON 8 (L) 07/27/2019 1336   TIBC 307 07/05/2019 1336   IRONPCTSAT 3 (L) 07/21/2019 1336       ASSESSMENT/PLAN:     1. ESRD THS Via R IJ TDC with [facility].  Marked Bun elevation noted, checking post BUN 2. S/p cardiac arrest 3. S/p PD cath placement 8/27 - continue to flush with HD -PD catheter will need to be removed if plan for PEG tube is there is an increased risk of infection.  Doubt any facility would be able to take him with a vent and peritoneal dialysis anyway.  Wife updated and is in agreement  Will contact gen surg for PD cath removal.   4. VDRF - on TC this AM 5. Anoxic brain injury - continue full support per family. 6. Hyperphosphatemia on  binders 7. Fevers/Leukocytsois, B Cx NGTD, s/p CEfepime/Vanc; has defervesced 8. Anemia of CKD.  Hgb stable.  Iron low, had angioedema to ferrlicet vs antibiotic.  Will list as allergy, d/w pharmacy.

## 2019-08-18 NOTE — Progress Notes (Signed)
Inpatient Diabetes Program Recommendations  AACE/ADA: New Consensus Statement on Inpatient Glycemic Control   Target Ranges:  Prepandial:   less than 140 mg/dL      Peak postprandial:   less than 180 mg/dL (1-2 hours)      Critically ill patients:  140 - 180 mg/dL   Results for NIKLAUS, DIRENZO (MRN [id number]) as of 08/18/2019 15:13  Ref. Range 08/17/2019 07:25 08/17/2019 11:22 08/17/2019 15:44 08/17/2019 19:43 08/17/2019 23:35 08/18/2019 04:09 08/18/2019 07:26 08/18/2019 11:53  Glucose-Capillary Latest Ref Range: 70 - 99 mg/dL 215 (H) 187 (H) 134 (H) 193 (H) 218 (H) 174 (H) 169 (H) 219 (H)   Review of Glycemic Control  Diabetes history: DM2 Outpatient Diabetes medications: Januvia 25 mg daily Current orders for Inpatient glycemic control: Lantus 35 units daily, Novolog 0-24 units Q4H; Nepro @ 40 mg/hr  Inpatient Diabetes Program Recommendations:   Insulin-Basal: Please decrease Lantus back down to 30 units daily.  Insulin-Tube Feeding: Please consider ordering Novolog 3 units Q4H for tube feeding coverage. If tube feeding is stopped or held then Novolog tube feeding coverage should also be stopped or held.  Thanks, Barnie Alderman, RN, MSN, CDE Diabetes Coordinator Inpatient Diabetes Program [phone] (Team Pager from 8am to 5pm)

## 2019-08-19 ENCOUNTER — Inpatient Hospital Stay (HOSPITAL_COMMUNITY): Payer: 59

## 2019-08-19 LAB — RENAL FUNCTION PANEL
Albumin: 3.2 g/dL — ABNORMAL LOW (ref 3.5–5.0)
Anion gap: 23 — ABNORMAL HIGH (ref 5–15)
BUN: 104 mg/dL — ABNORMAL HIGH (ref 6–20)
CO2: 21 mmol/L — ABNORMAL LOW (ref 22–32)
Calcium: 9.6 mg/dL (ref 8.9–10.3)
Chloride: 92 mmol/L — ABNORMAL LOW (ref 98–111)
Creatinine, Ser: 6.7 mg/dL — ABNORMAL HIGH (ref 0.61–1.24)
GFR calc Af Amer: 10 mL/min — ABNORMAL LOW (ref >60)
GFR calc non Af Amer: 9 mL/min — ABNORMAL LOW (ref >60)
Glucose, Bld: 191 mg/dL — ABNORMAL HIGH (ref 70–99)
Phosphorus: 6.3 mg/dL — ABNORMAL HIGH (ref 2.5–4.6)
Potassium: 4.4 mmol/L (ref 3.5–5.1)
Sodium: 136 mmol/L (ref 135–145)

## 2019-08-19 LAB — GLUCOSE, CAPILLARY
Glucose-Capillary: 156 mg/dL — ABNORMAL HIGH (ref 70–99)
Glucose-Capillary: 156 mg/dL — ABNORMAL HIGH (ref 70–99)
Glucose-Capillary: 174 mg/dL — ABNORMAL HIGH (ref 70–99)
Glucose-Capillary: 189 mg/dL — ABNORMAL HIGH (ref 70–99)
Glucose-Capillary: 192 mg/dL — ABNORMAL HIGH (ref 70–99)
Glucose-Capillary: 198 mg/dL — ABNORMAL HIGH (ref 70–99)

## 2019-08-19 LAB — CULTURE, RESPIRATORY W GRAM STAIN: Culture: NORMAL

## 2019-08-19 LAB — BASIC METABOLIC PANEL
Anion gap: 22 — ABNORMAL HIGH (ref 5–15)
BUN: 109 mg/dL — ABNORMAL HIGH (ref 6–20)
CO2: 22 mmol/L (ref 22–32)
Calcium: 9.7 mg/dL (ref 8.9–10.3)
Chloride: 92 mmol/L — ABNORMAL LOW (ref 98–111)
Creatinine, Ser: 6.79 mg/dL — ABNORMAL HIGH (ref 0.61–1.24)
GFR calc Af Amer: 10 mL/min — ABNORMAL LOW (ref >60)
GFR calc non Af Amer: 9 mL/min — ABNORMAL LOW (ref >60)
Glucose, Bld: 191 mg/dL — ABNORMAL HIGH (ref 70–99)
Potassium: 4.4 mmol/L (ref 3.5–5.1)
Sodium: 136 mmol/L (ref 135–145)

## 2019-08-19 LAB — CBC
HCT: 34.2 % — ABNORMAL LOW (ref 39.0–52.0)
Hemoglobin: 10.6 g/dL — ABNORMAL LOW (ref 13.0–17.0)
MCH: 28 pg (ref 26.0–34.0)
MCHC: 31 g/dL (ref 30.0–36.0)
MCV: 90.2 fL (ref 80.0–100.0)
Platelets: 481 10*3/uL — ABNORMAL HIGH (ref 150–400)
RBC: 3.79 MIL/uL — ABNORMAL LOW (ref 4.22–5.81)
RDW: 14.2 % (ref 11.5–15.5)
WBC: 11.5 10*3/uL — ABNORMAL HIGH (ref 4.0–10.5)
nRBC: 0 % (ref 0.0–0.2)

## 2019-08-19 LAB — MAGNESIUM: Magnesium: 2.8 mg/dL — ABNORMAL HIGH (ref 1.7–2.4)

## 2019-08-19 NOTE — Progress Notes (Signed)
Assisted tele visit to patient with wife.  [place] Gayden, [name], RN

## 2019-08-19 NOTE — Progress Notes (Signed)
Inpatient Diabetes Program Recommendations  AACE/ADA: New Consensus Statement on Inpatient Glycemic Control   Target Ranges:  Prepandial:   less than 140 mg/dL      Peak postprandial:   less than 180 mg/dL (1-2 hours)      Critically ill patients:  140 - 180 mg/dL   Results for Larry Banks, Larry Banks (MRN [id number]) as of 08/19/2019 07:28  Ref. Range 08/18/2019 07:26 08/18/2019 11:53 08/18/2019 15:18 08/18/2019 19:44 08/19/2019 00:15 08/19/2019 04:35  Glucose-Capillary Latest Ref Range: 70 - 99 mg/dL 169 (H) 219 (H) 315 (H) 165 (H) 198 (H) 192 (H)   Review of Glycemic Control  Diabetes history: DM2 Outpatient Diabetes medications: Januvia 25 mg daily Current orders for Inpatient glycemic control: Lantus 30 units daily, Novolog 0-20 units Q4H, Novolog 3 units Q4H; Nepro @ 40 ml/hr  Inpatient Diabetes Program Recommendations:   Insulin-Tube Feeding: Please consider increasing tube feeding coverage to Novolog 5 units Q4H. If tube feeding is stopped or held then Novolog tube feeding coverage should also be stopped or held.  Thanks, Barnie Alderman, RN, MSN, CDE Diabetes Coordinator Inpatient Diabetes Program [phone] (Team Pager from 8am to 5pm)

## 2019-08-19 NOTE — Progress Notes (Signed)
Assisted tele visit to patient with wife.  Maryelizabeth Rowan, RN

## 2019-08-19 NOTE — Progress Notes (Addendum)
Subjective: CC: Patient is on trach collar. He does not respond to his name   Objective: Vital signs in last 24 hours: Temp:  [98.6 F (37 C)-99.9 F (37.7 C)] 99.4 F (37.4 C) (09/16 0739) Pulse Rate:  [90-120] 120 (09/16 0726) Resp:  [18-29] 18 (09/16 0700) BP: (89-122)/(69-94) 112/84 (09/16 0707) SpO2:  [93 %-100 %] 100 % (09/16 0726) FiO2 (%):  [28 %] 28 % (09/16 0726) Weight:  [81.2 kg] 81.2 kg (09/16 0500) Last BM Date: 08/18/19  Intake/Output from previous day: 09/15 0701 - 09/16 0700 In: 1101.3 [I.V.:35.3; NG/GT:860; IV Piggyback:206] Out: 1996 [Urine:115; Stool:130] Intake/Output this shift: No intake/output data recorded.  PE: Gen: He is non-responsive Lungs: On trach collar Abd: Soft, ND, no rigidity, PD catheter present and without signs of infection. Recent laparoscopic incisoins with dermabond over them. +BS Msk: SCDs and una boots on b/l  Lab Results:  Recent Labs    08/18/19 0256 08/19/19 0220  WBC 13.8* 11.5*  HGB 9.7* 10.6*  HCT 30.0* 34.2*  PLT 458* 481*   BMET Recent Labs    08/18/19 0256 08/18/19 1208 08/19/19 0220  NA 137  --  136  136  K 4.8  --  4.4  4.4  CL 92*  --  92*  92*  CO2 19*  --  22  21*  GLUCOSE 180*  --  191*  191*  BUN 201* 60* 109*  104*  CREATININE 11.54*  --  6.79*  6.70*  CALCIUM 9.4  --  9.7  9.6   PT/INR No results for input(s): LABPROT, INR in the last 72 hours. CMP     Component Value Date/Time   NA 136 08/19/2019 0220   NA 136 08/19/2019 0220   K 4.4 08/19/2019 0220   K 4.4 08/19/2019 0220   CL 92 (L) 08/19/2019 0220   CL 92 (L) 08/19/2019 0220   CO2 21 (L) 08/19/2019 0220   CO2 22 08/19/2019 0220   GLUCOSE 191 (H) 08/19/2019 0220   GLUCOSE 191 (H) 08/19/2019 0220   BUN 104 (H) 08/19/2019 0220   BUN 109 (H) 08/19/2019 0220   CREATININE 6.70 (H) 08/19/2019 0220   CREATININE 6.79 (H) 08/19/2019 0220   CALCIUM 9.6 08/19/2019 0220   CALCIUM 9.7 08/19/2019 0220   PROT 6.0 (L)  08/10/2019 0422   ALBUMIN 3.2 (L) 08/19/2019 0220   AST 31 08/10/2019 0422   ALT 25 08/10/2019 0422   ALKPHOS 68 08/10/2019 0422   BILITOT 0.6 08/10/2019 0422   GFRNONAA 9 (L) 08/19/2019 0220   GFRNONAA 9 (L) 08/19/2019 0220   GFRAA 10 (L) 08/19/2019 0220   GFRAA 10 (L) 08/19/2019 0220   Lipase  No results found for: LIPASE     Studies/Results: Dg Chest Port 1 View  Result Date: 08/19/2019 CLINICAL DATA:  Tracheostomy/ventilation present, acute respiratory failure. EXAM: PORTABLE CHEST 1 VIEW COMPARISON:  08/16/2019 FINDINGS: Tracheostomy tube unchanged. Enteric tube courses into the stomach and off the film as tip is not visualized. Right IJ central venous catheter unchanged with tip over the right atrium. Lungs are hypoinflated and otherwise clear. Cardiomediastinal silhouette and remainder of the exam is unchanged. IMPRESSION: Hypoinflation without acute cardiopulmonary disease. Tubes and lines as described. Electronically Signed   By: Marin Olp M.D.   On: 08/19/2019 07:24    Anti-infectives: Anti-infectives (From admission, onward)   Start     Dose/Rate Route Frequency Ordered Stop   08/12/19 1200  ceFEPIme (MAXIPIME)  1 g in sodium chloride 0.9 % 100 mL IVPB     1 g 200 mL/hr over 30 Minutes Intravenous Every 24 hours 08/12/19 0930 08/14/19 1142   08/12/19 1147  ceFEPIme (MAXIPIME) 1 g in sodium chloride 0.9 % 100 mL IVPB  Status:  Discontinued     1 g 200 mL/hr over 30 Minutes Intravenous Every 24 hours 08/11/19 1603 08/12/19 0930   08/11/19 1200  vancomycin (VANCOCIN) IVPB 1000 mg/200 mL premix  Status:  Discontinued     1,000 mg 200 mL/hr over 60 Minutes Intravenous Every T-Th-Sa (Hemodialysis) 08/08/19 1054 08/12/19 0857   08/08/19 1300  vancomycin (VANCOCIN) 2,000 mg in sodium chloride 0.9 % 500 mL IVPB     2,000 mg 250 mL/hr over 120 Minutes Intravenous  Once 08/08/19 1054 08/08/19 1632   08/08/19 1200  ceFEPIme (MAXIPIME) 2 g in sodium chloride 0.9 % 100 mL IVPB   Status:  Discontinued     2 g 200 mL/hr over 30 Minutes Intravenous Every T-Th-Sa (Hemodialysis) 08/08/19 1054 08/11/19 1603   07/23/2019 1115  ampicillin-sulbactam (UNASYN) 1.5 g in sodium chloride 0.9 % 100 mL IVPB  Status:  Discontinued     1.5 g 200 mL/hr over 30 Minutes Intravenous Every 12 hours 07/07/2019 1104 08/05/19 0846       Assessment/Plan PEA cardiac arrest with anoxic brain injury Acute hypoxic respiratory failure Angioedema of the tongue Sepsis End-stage renal disease -on hemodialysis now  He is currently on dialysis TThS Type 2 diabetes Hypertension Hyperlipidemia Recently had a PD catheter placed on 07/30/2019  Requested to remove PD catheter and place gastrostomy tube   - Will plan to do this in the OR tomorrow  - Hold tube feeds and heparin after midnight   FEN: Tube feeding/currently n.p.o. ID: Currently off antibiotics DVT: Heparin, SCDs Follow-up: To be determined POC: Glenn Polkinghorn [phone]   LOS: 18 days    Jillyn Ledger , [facility] 08/19/2019, 9:04 AM Pager: [phone]  Agree with above. The patient is non responsive and cannot participate in his care.  I spoke to his wife, [name], about the surgery.  She had a gastrostomy tube several years ago, so she is familiar with this. She is also on the video in to the room.  Will plan to remove PD cath tomorrow and do PEG.  I talked to wife about risk of surgery - leak, bleeding, need for open surgery.  Alphonsa Overall, MD, [facility] Pager: [phone] Office phone:  [phone]

## 2019-08-19 NOTE — Progress Notes (Addendum)
[facility]    NEPHROLOGY PROGRESS NOTE  SUBJECTIVE:  NAD, had dialysis yesterday.  Tmax 99.9.  Remains unresponsive   OBJECTIVE:  Vitals:   08/19/19 0726 08/19/19 0739  BP:    Pulse: (!) 120   Resp:    Temp:  99.4 F (37.4 C)  SpO2: 100%     Intake/Output Summary (Last 24 hours) at 08/19/2019 0851 Last data filed at 08/19/2019 0700 Gross per 24 hour  Intake 1071.26 ml  Output 1996 ml  Net -924.74 ml      General: NAD, unresponsive HEENT: eyes closed Neck:  No JVD, trach in place CV:  Heart RRR, Lungs:  Clear anteriorly Abd:  abd SNT/ND with normal BS, some distention but soft Extremities:  No LE edema. ACCESS: RLQ PD cath, R IJ TDC  MEDICATIONS:  . amLODipine  5 mg Per Tube Daily  . calcium acetate (Phos Binder)  1,334 mg Per Tube TID WC  . chlorhexidine gluconate (MEDLINE KIT)  15 mL Mouth Rinse BID  . Chlorhexidine Gluconate Cloth  6 each Topical Daily  . darbepoetin (ARANESP) injection - DIALYSIS  60 mcg Intravenous Q Tue-HD  . famotidine  10 mg Per Tube BID  . feeding supplement (NEPRO CARB STEADY)  1,000 mL Per Tube Q24H  . feeding supplement (PRO-STAT SUGAR FREE 64)  60 mL Per Tube BID  . fentaNYL (SUBLIMAZE) injection  200 mcg Intravenous Once  . heparin  5,000 Units Subcutaneous Q8H  . insulin aspart  0-20 Units Subcutaneous Q4H  . insulin aspart  3 Units Subcutaneous Q4H  . insulin glargine  30 Units Subcutaneous Daily  . mouth rinse  15 mL Mouth Rinse 10 times per day  . metoprolol tartrate  12.5 mg Per NG tube BID  . sodium chloride flush  3 mL Intravenous Q12H       LABS:   CBC Latest Ref Rng & Units 08/19/2019 08/18/2019 08/17/2019  WBC 4.0 - 10.5 K/uL 11.5(H) 13.8(H) 14.7(H)  Hemoglobin 13.0 - 17.0 g/dL 10.6(L) 9.7(L) 9.8(L)  Hematocrit 39.0 - 52.0 % 34.2(L) 30.0(L) 31.8(L)  Platelets 150 - 400 K/uL 481(H) 458(H) 467(H)    CMP Latest Ref Rng & Units 08/19/2019 08/19/2019 08/18/2019  Glucose 70 - 99 mg/dL 191(H) 191(H) -  BUN  6 - 20 mg/dL 109(H) 104(H) 60(H)  Creatinine 0.61 - 1.24 mg/dL 6.79(H) 6.70(H) -  Sodium 135 - 145 mmol/L 136 136 -  Potassium 3.5 - 5.1 mmol/L 4.4 4.4 -  Chloride 98 - 111 mmol/L 92(L) 92(L) -  CO2 22 - 32 mmol/L 22 21(L) -  Calcium 8.9 - 10.3 mg/dL 9.7 9.6 -  Total Protein 6.5 - 8.1 g/dL - - -  Total Bilirubin 0.3 - 1.2 mg/dL - - -  Alkaline Phos 38 - 126 U/L - - -  AST 15 - 41 U/L - - -  ALT 0 - 44 U/L - - -    Lab Results  Component Value Date   PTH 93 (H) 07/19/2019   CALCIUM 9.6 08/19/2019   CALCIUM 9.7 08/19/2019   CAION 1.00 (L) 08/03/2019   PHOS 6.3 (H) 08/19/2019       Component Value Date/Time   COLORURINE YELLOW 08/08/2019 0106   APPEARANCEUR CLEAR 08/08/2019 0106   LABSPEC 1.014 08/08/2019 0106   PHURINE 5.0 08/08/2019 0106   GLUCOSEU NEGATIVE 08/08/2019 0106   HGBUR MODERATE (A) 08/08/2019 0106   BILIRUBINUR NEGATIVE 08/08/2019 0106   KETONESUR NEGATIVE 08/08/2019 0106   PROTEINUR 100 (A)  08/08/2019 0106   NITRITE NEGATIVE 08/08/2019 0106   LEUKOCYTESUR NEGATIVE 08/08/2019 0106      Component Value Date/Time   PHART 7.424 08/03/2019 0651   PCO2ART 42.0 08/03/2019 0651   PO2ART 133.0 (H) 08/03/2019 0651   HCO3 27.5 08/03/2019 0651   TCO2 29 08/03/2019 0651   ACIDBASEDEF 1.3 07/27/2019 0755   O2SAT 99.0 08/03/2019 0651       Component Value Date/Time   IRON 8 (L) 07/31/2019 1336   TIBC 307 07/30/2019 1336   IRONPCTSAT 3 (L) 07/11/2019 1336       ASSESSMENT/PLAN:     1. ESRD THS Via R IJ TDC with [facility].  Marked Bun elevation noted, likely from catabolic state/ TF.  HD MWF schedule 2. S/p cardiac arrest 3. S/p PD cath placement 8/27: no longer a PD candidate given anoxic brain injury.  This is a nidus of infection.  Given low-grade temp spikes, need for PEG tube (which can increase risk of peritonitis if both are present), PD cath needs to be removed at some point.  Appreciate the assistance of gen surg. 4. VDRF - on TC this AM 5. Anoxic brain injury  - continue scope of care with all aggressive  6. Hyperphosphatemia on binders 7. Fevers/Leukocytsois, B Cx NGTD, s/p CEfepime/Vanc; fever curve has improved overall; noting continued elevated temps with Tmax yesterday 99.9. 8. Anemia of CKD.  Hgb stable.  Iron low, had angioedema to ferrlicet vs antibiotic.  Will list as allergy, d/w pharmacy.

## 2019-08-19 NOTE — Progress Notes (Signed)
PULMONARY / CRITICAL CARE MEDICINE   NAME:  Larry Banks, MRN:  [id number], DOB:  [birth year], LOS: 36 ADMISSION DATE:  08/03/2019, CONSULTATION DATE:  07/23/2019  REFERRING MD:  EDP Dr. Tomi Bamberger   CHIEF COMPLAINT:  Cardiac Arrest/Resp Failure   BRIEF HISTORY:    48 year old male with medical history significant for hypertension, PAD, hyperlipidemia, CKD Stage 5 developed shortness of breath at home.  Patient does have CKD Stage 5 .  Is followed at [facility].  Recently had peritoneal dialysis catheter placed on August 27. Has not started Dialysis yet.  On arrival to the emergency room on 8/29  patient suffered a cardiac arrest with asystole.  He received CPR x15 minutes.  He receieved hyperkalemic protocol due to his underlying chronic kidney disease with 1 amp bicarb, 1 amp D50, insulin 10 units regular IV.  He received epi x3.  He had ROSC after 15 minutes.  He did require very brief Neo-Synephrine for hypotension.  He had significant agitation was started on sedation with Versed and Fentanyl.    Patient was transported to [facility] ICU for [name] to admit.  Remains intubated without significant neurologic recovery. EEG and CT suggestive of severe anoxic brain injury  SIGNIFICANT PAST MEDICAL HISTORY   Hypertension, hyperlipidemia, PAD, chronic kidney disease stage V  SIGNIFICANT EVENTS:  8/26-  TTS HD - last 07/29/2019 [name] (old tunneled Rt HD cah) 8/27 - PD cath - for a planned change from iHD to PD 8/29- Cardiac arrest, CPR x15 minutes, epi x3. Per renal CXR- c/w pulmonary edema 8/31- 1 unit PRBC, paralyzed due to vent dysynchrony 9/1- off pressors 9/5 antibiotics restarted- fevers, leukocytosis, arrhythmias  STUDIES:   8/29 CT head  > no acute abnormality 8/29 CT abdomen pelvis > bilateral lower lobe consolidation with small pleural effusion.  Groundglass opacities lower portion of the lung.  Nonobstructive renal calculus, 8/29 Echo > ef 45-50%, hypokineses of the mid-inferior  and lateral wall 8/29 - EEG> diffuse slowing and generalized suppression suggestive of profound encephalopathy, no seizure activity 9/1 CT head>severe diffuse brain edema compatible with widespread hypoxic injury 9/2 EEG> suggestive ofprofound diffuse encephalopathy,could besecondary to anoxic/hypoxic brain injury.No seizures or epileptiform discharges were seen throughout the recording.  9/3 MRI brain>Diffuse hypoxic ischemic injury most pronounced affecting the cerebral hemispheric cortex but with some involvement also of the basal ganglia and thalami. 9/4 & 9/5: CXR- no opacities 9/13: CXR-no focal infiltrates or opacities.New feeding catheter extending into the stomach. No acute abnormality is seen. 9/16 CXR: no acute changes  CULTURES:  8/29 BC >>Negative  8/29 Sputum >> Rare GNR, GPC>> Normal Flora 8/29 COVID 19 >> NEG  MRSA surveillance negative  9/5 blood culture >>NGTD 9/5 urine culture >> negative 9/13 Sputum>> abundant GNR, rare GPC 9/13 Blood>>   ANTIBIOTICS:  Unasyn 8/29 >9/1 Cefepime 9/5> 9/11 vanc 9/5 > 9/9  LINES/TUBES:  Peritoneal cath 8/27 (PTA )  R HD cath ? >>  ------- 8/29 ETT >> 9/10 Trach (JY) 9/10 >>  8/29 L IJ CVL> 9/4 8/29 A line>>8/30 No foley. Unclear when D/ced.   CONSULTANTS:  Nephrology  Neurology  Interval/overnight events: No events overnight.   CONSTITUTIONAL: BP 112/84   Pulse (!) 120   Temp 99.4 F (37.4 C) (Oral)   Resp 18   Ht 5\' 6"  (1.676 m)   Wt 81.2 kg   SpO2 100%   BMI 28.89 kg/m   I/O last 3 completed shifts: In: 1873.4 [I.V.:124.4; NG/GT:1440; IV [id number] Out:  2066 [Urine:185; Other:1751; Stool:130]   PE General: chronically ill appearing HENT: trach cuff in place. No surrounding drainage. CV: Irregular rhythm. Tachycardic. No peripheral edema. Extremities warm. Lungs: diffuse rhonchi  ABD: Rounded, PD cath in place. + BS x4.  Skin: warm. No rash. Neuro: not alert. Does not respond to voice or  follow commands. Withdraws to pain. PERRL.     RESOLVED PROBLEM LIST  NA ASSESSMENT AND PLAN   48 year old male with history of ESRD on dialysis who presented to [facility] for 2d history of SOB and subsequently had PEA cardiac arrest while in ED. ROSC achieved after 79min of CPR and was transferred to [facility] ICU for further management.  PEA cardiac arrest in hospital. ROSC achieved after 68min of CPR. Etiology of arrest unclear. Transitioned from HD to PD and had missed dialysis the day of admission, presenting K was WNL.  S/p 36 degree TTM.    -New moderate hypokinesis of the mid-inferior and lateral wall, new since echo in 2019 -may need further eval with cardiology if there is further neurological recovery  Malnutrition --G tube unable to be placed until PD cath is removed. Plan is for that to occur tomorrow while simultaneously placing G tube --hold tube feeds and heparin after MN in anticipation of proced tomorrow  Acute hypoxic respiratory failure. Trached on 9/10 for prolonged ETT time. CXR today neg for acute changes. --Continue standard trach care --VAP protocol  Encephalopathy. EEG most consistent with anoxic brain injury. MRI with restricted diffusion throughout the entire cortex. -Prolonged support desired by family to allow for possible neurological improvement.  -Appreciate palliative care input  Rhythmic L hand movements: renally dosed keppra   ESRD stage 5, urinary retention requiring frequent straight catheterization. Hyperphosphatemia. -PD cath placed the end of august however is no longer a candidate in light of anoxic brain injury -MWF HD schedule P: -PD cath removal tomorrow -continue binders for hyperphosphatemia   Anemia of chronic disease and iron deficiency-stable. Avoid iron infusions 2/2 possible reaction/angioadema after receiving a dose  Hypertension-improved. Continue amlodipine and metoprolol  Type 2 DM, uncontrolled, but improved. Continue same  Lantus 30 + SSI   Best Practice / Goals of Care / Disposition.   Diet: Tube feeds restart when NG tube placed, will need PEG Pain/Anxiety/Delirium protocol (if indicated): prn meds  VAP protocol (if indicated): ordered DVT prophylaxis: heparin SQ GI prophylaxis: famotidine Glucose control: SSI, Lantus Mobility: Bed Code Status: Full Family Communication: No family at bedside 9/15 am Disposition: ICU  Consider transition to progressive unit in next 24 hours. Currently requiring suctioning every 2-3 hours of thick white secretions   The patient is critically ill with VDRF. He requires ICU for high complexity decision making, titration of high alert medications, ventilator management, titration of oxygen and interpretation of advanced monitoring.    I personally spent 20 minutes providing critical care services including personally reviewing test results, discussing care with nursing staff/other physicians and completing orders pertaining to this patient.  Time was exclusive to the patient and does not include time spent teaching or in procedures.  Magdalen Spatz,  MSN, AGACNP  Pager [phone]  or if no answer [phone] [facility] 08/19/2019 7:50 AM

## 2019-08-19 NOTE — Progress Notes (Signed)
PROGRESS NOTE    Larry Banks  VEH:[id number] DOB: 09/29/1971 DOA: 07/08/2019 PCP: Curlene Labrum, MD   Brief Narrative:  48 year old with history of essential hypertension, peripheral arterial disease, hyperlipidemia, CKD stage V developed shortness of breath at home came to any pain hospital.  Usually follows with [facility] and recently was transitioned to peritoneal dialysis.  Upon arrival to the ER he went into cardiac arrest with asystole requiring CPR.  Transferred to ICU after ROSC 15 minutes later.  Initially required pressors.  HD catheter placed 8/26.  CT of the head was negative.  CT of the abdomen pelvis initially showed bilateral lower lobes pleural effusions nonobstructive renal stone.  Echocardiogram showed ejection fraction 45-50% with hypokinesis.  EEG showed profound slowing of brain activity but no seizure.  CT of the head repeated on 9/1 showed severe diffuse brain edema with widespread hypoxic injury.  Repeat EEG 9/2 showed profound diffuse encephalopathy secondary to hypoxic brain injury.  MRI brain 9/3 showed hypoxic diffuse brain injury.  Most of the cultures remain negative except sputum culture showed above-noted gram-negative rods.  Initially has received Unasyn, cefepime and vancomycin but currently off antibiotics.  Intubated from 8/29 to 9/10.   Assessment & Plan:   Active Problems:   Cardiac arrest ([place])   Encounter for central line placement   Acute respiratory failure (HCC)   Ventilator dependence ([place])   ESRD needing dialysis ([place])   Anoxic brain damage (HCC)   PEA cardiac arrest Encephalopathy secondary to anoxic brain injury -CT of the head/MRI and EEG are consistent with anoxic brain injury. -Supportive care. -Status post tracheostomy.  Standard care only -Echocardiogram showed ejection fraction 45-50% with hypokinesis  Moderate protein calorie malnutrition Dysphagia due to encephalopathy -Currently feeding tube in place.   Plans for PEG tube placement per general surgery.  PD catheter in place, will need to be removed.  Rhythmic left humerus -Renally dosed Keppra.  ESRD on hemodialysis -Nephrology team following.  Currently getting hemodialysis.  With his right chest wall HD catheter.  Anemia of chronic disease/iron deficiency -Apparently developed angioedema reaction secondary to iron infusion.  We will hold off on this.  Monitor hemoglobin  Diabetes mellitus type 2, insulin-dependent -Resume home meds.  Insulin sliding scale Accu-Chek -Lantus 30 units daily -NovoLog 3 units every 4 hours  Essential hypertension with sinus tachycardia -On amlodipine and metoprolol.    DVT prophylaxis: Cutaneous heparin Code Status: Full code Family Communication: Spoke with patient's wife camera in his room Disposition Plan: To be determined  Consultants:   Nephrology  General surgery  [name]  Antimicrobials:  Unasyn 8/29 >9/1 Cefepime 9/5> 9/11 vanc 9/5 > 9/9   Subjective: Patient remains unresponsive.  No meaningful interaction.  Spoke with patient's wife over back and that is in his room.  She has been updated and all the questions have been answered.  Spoke with patient's RN at bedside as well.  Review of Systems Otherwise negative except as per HPI, including: Unable to obtain Objective: Vitals:   08/19/19 1000 08/19/19 1100 08/19/19 1103 08/19/19 1116  BP: 116/86 106/88    Pulse: (!) 110 (!) 111  (!) 108  Resp: (!) 22 (!) 23  17  Temp:   99 F (37.2 C)   TempSrc:   Oral   SpO2: 99% 98%    Weight:      Height:        Intake/Output Summary (Last 24 hours) at 08/19/2019 1151 Last data filed at  08/19/2019 1100 Gross per 24 hour  Intake 1231.26 ml  Output 245 ml  Net 986.26 ml   Filed Weights   08/17/19 0401 08/18/19 0200 08/19/19 0500  Weight: 83.5 kg 82.8 kg 81.2 kg    Examination:  General exam: Remains unresponsive.  Core track in place Respiratory system: Bibasilar crackles.   Trach in place Cardiovascular system: Sinus tachycardia. Gastrointestinal system: PD catheter in place without any evidence of infection.  Positive bowel sounds. Central nervous system: Unable to perform given his mentation Extremities: No contractures. Skin: No rashes, lesions or ulcers Psychiatry: Difficult to assess  Condom catheter in place Tracheostomy Right chest wall hemodialysis catheter    Data Reviewed:   CBC: Recent Labs  Lab 08/14/19 0324 08/15/19 0636 08/17/19 0743 08/18/19 0256 08/19/19 0220  WBC 14.4* 15.4* 14.7* 13.8* 11.5*  HGB 9.6* 9.8* 9.8* 9.7* 10.6*  HCT 31.6* 31.6* 31.8* 30.0* 34.2*  MCV 93.2 90.8 90.6 89.3 90.2  PLT 406* 401* 467* 458* 470*   Basic Metabolic Panel: Recent Labs  Lab 08/13/19 0235 08/14/19 0324 08/15/19 0339 08/16/19 1628 08/17/19 0743 08/18/19 0256 08/18/19 1208 08/19/19 0220  NA 137 137 139 135 136 137  --  136  136  K 4.6 4.1 4.2 5.0 5.3* 4.8  --  4.4  4.4  CL 97* 96* 99 92* 92* 92*  --  92*  92*  CO2 18* 22 20* 21* 19* 19*  --  22  21*  GLUCOSE 210* 177* 187* 177* 208* 180*  --  191*  191*  BUN 136* 79* 128* 128* 157* 201* 60* 109*  104*  CREATININE 8.29* 6.62* 8.77* 7.90* 10.03* 11.54*  --  6.79*  6.70*  CALCIUM 9.0 9.3 9.8 9.7 9.6 9.4  --  9.7  9.6  MG 3.0* 2.6* 2.9*  --   --   --   --  2.8*  PHOS 9.3*  --  7.1*  --  9.2* 10.8*  --  6.3*   GFR: Estimated Creatinine Clearance: 13.4 mL/min (A) (by C-G formula based on SCr of 6.7 mg/dL (H)). Liver Function Tests: Recent Labs  Lab 08/17/19 0743 08/18/19 0256 08/19/19 0220  ALBUMIN 2.9* 2.8* 3.2*   No results for input(s): LIPASE, AMYLASE in the last 168 hours. No results for input(s): AMMONIA in the last 168 hours. Coagulation Profile: Recent Labs  Lab 08/13/19 0235  INR 1.0   Cardiac Enzymes: No results for input(s): CKTOTAL, CKMB, CKMBINDEX, TROPONINI in the last 168 hours. BNP (last 3 results) No results for input(s): PROBNP in the last 8760  hours. HbA1C: No results for input(s): HGBA1C in the last 72 hours. CBG: Recent Labs  Lab 08/18/19 1944 08/19/19 0015 08/19/19 0435 08/19/19 0742 08/19/19 1104  GLUCAP 165* 198* 192* 189* 174*   Lipid Profile: No results for input(s): CHOL, HDL, LDLCALC, TRIG, CHOLHDL, LDLDIRECT in the last 72 hours. Thyroid Function Tests: No results for input(s): TSH, T4TOTAL, FREET4, T3FREE, THYROIDAB in the last 72 hours. Anemia Panel: No results for input(s): VITAMINB12, FOLATE, FERRITIN, TIBC, IRON, RETICCTPCT in the last 72 hours. Sepsis Labs: Recent Labs  Lab 08/17/19 0751  PROCALCITON 7.29    Recent Results (from the past 240 hour(s))  Expectorated sputum assessment w rflx to resp cult     Status: None   Collection Time: 08/16/19  3:29 PM   Specimen: Tracheal Aspirate; Sputum  Result Value Ref Range Status   Specimen Description TRACHEAL ASPIRATE  Final   Special Requests NONE  Final  Sputum evaluation   Final    THIS SPECIMEN IS ACCEPTABLE FOR SPUTUM CULTURE Performed at [facility], [place] [address] [place], [place] [address]    Report Status 08/16/2019 FINAL  Final  Culture, respiratory     Status: None   Collection Time: 08/16/19  3:29 PM   Specimen: Tracheal Aspirate  Result Value Ref Range Status   Specimen Description TRACHEAL ASPIRATE  Final   Special Requests NONE Reflexed from [id number]  Final   Gram Stain   Final    ABUNDANT SQUAMOUS EPITHELIAL CELLS PRESENT ABUNDANT WBC PRESENT, PREDOMINANTLY PMN ABUNDANT GRAM NEGATIVE RODS RARE GRAM POSITIVE COCCI    Culture   Final    FEW Consistent with normal respiratory flora. Performed at [facility], [place] [address] [place], [place] [address]    Report Status 08/19/2019 FINAL  Final  Culture, blood (routine x 2)     Status: None (Preliminary result)   Collection Time: 08/16/19  4:28 PM   Specimen: BLOOD  Result Value Ref Range Status   Specimen Description BLOOD LEFT ANTECUBITAL  Final   Special  Requests AEROBIC BOTTLE ONLY Blood Culture adequate volume  Final   Culture   Final    NO GROWTH 3 DAYS Performed at [place] [address] [place], [place] [address]    Report Status PENDING  Incomplete  Culture, blood (routine x 2)     Status: None (Preliminary result)   Collection Time: 08/16/19  4:39 PM   Specimen: BLOOD  Result Value Ref Range Status   Specimen Description BLOOD BLOOD RIGHT HAND  Final   Special Requests AEROBIC BOTTLE ONLY Blood Culture adequate volume  Final   Culture   Final    NO GROWTH 3 DAYS Performed at [facility], [place] [address], [place], [place] [address]    Report Status PENDING  Incomplete         Radiology Studies: Dg Chest Port 1 View  Result Date: 08/19/2019 CLINICAL DATA:  Tracheostomy/ventilation present, acute respiratory failure. EXAM: PORTABLE CHEST 1 VIEW COMPARISON:  08/16/2019 FINDINGS: Tracheostomy tube unchanged. Enteric tube courses into the stomach and off the film as tip is not visualized. Right IJ central venous catheter unchanged with tip over the right atrium. Lungs are hypoinflated and otherwise clear. Cardiomediastinal silhouette and remainder of the exam is unchanged. IMPRESSION: Hypoinflation without acute cardiopulmonary disease. Tubes and lines as described. Electronically Signed   By: Marin Olp M.D.   On: 08/19/2019 07:24        Scheduled Meds: . amLODipine  5 mg Per Tube Daily  . calcium acetate (Phos Binder)  1,334 mg Per Tube TID WC  . chlorhexidine gluconate (MEDLINE KIT)  15 mL Mouth Rinse BID  . Chlorhexidine Gluconate Cloth  6 each Topical Daily  . darbepoetin (ARANESP) injection - DIALYSIS  60 mcg Intravenous Q Tue-HD  . famotidine  10 mg Per Tube BID  . feeding supplement (NEPRO CARB STEADY)  1,000 mL Per Tube Q24H  . feeding supplement (PRO-STAT SUGAR FREE 64)  60 mL Per Tube BID  . fentaNYL (SUBLIMAZE) injection  200 mcg Intravenous Once  . heparin  5,000 Units Subcutaneous Q8H   . insulin aspart  0-20 Units Subcutaneous Q4H  . insulin aspart  3 Units Subcutaneous Q4H  . insulin glargine  30 Units Subcutaneous Daily  . mouth rinse  15 mL Mouth Rinse 10 times per day  . metoprolol tartrate  12.5 mg  Per NG tube BID  . sodium chloride flush  3 mL Intravenous Q12H   Continuous Infusions: . sodium chloride 250 mL (08/16/19 1736)  . sodium chloride    . sodium chloride    . sodium chloride    . sodium chloride    . sodium chloride Stopped (08/18/19 1031)  . sodium chloride    . sodium chloride    . levETIRAcetam Stopped (08/19/19 0536)     LOS: 18 days   Time spent= 35 mins    Ricketta Colantonio Arsenio Loader, MD [facility]  If 7PM-7AM, please contact night-coverage [email or web address] 08/19/2019, 11:51 AM

## 2019-08-20 ENCOUNTER — Encounter (HOSPITAL_COMMUNITY): Payer: Self-pay | Admitting: Anesthesiology

## 2019-08-20 ENCOUNTER — Inpatient Hospital Stay (HOSPITAL_COMMUNITY): Payer: 59

## 2019-08-20 ENCOUNTER — Encounter (HOSPITAL_COMMUNITY): Admission: EM | Disposition: E | Payer: Self-pay | Source: Home / Self Care | Attending: Emergency Medicine

## 2019-08-20 DIAGNOSIS — R569 Unspecified convulsions: Secondary | ICD-10-CM

## 2019-08-20 LAB — RENAL FUNCTION PANEL
Albumin: 3.1 g/dL — ABNORMAL LOW (ref 3.5–5.0)
Anion gap: 24 — ABNORMAL HIGH (ref 5–15)
BUN: 179 mg/dL — ABNORMAL HIGH (ref 6–20)
CO2: 21 mmol/L — ABNORMAL LOW (ref 22–32)
Calcium: 9.5 mg/dL (ref 8.9–10.3)
Chloride: 90 mmol/L — ABNORMAL LOW (ref 98–111)
Creatinine, Ser: 10.11 mg/dL — ABNORMAL HIGH (ref 0.61–1.24)
GFR calc Af Amer: 6 mL/min — ABNORMAL LOW (ref >60)
GFR calc non Af Amer: 5 mL/min — ABNORMAL LOW (ref >60)
Glucose, Bld: 110 mg/dL — ABNORMAL HIGH (ref 70–99)
Phosphorus: 9.6 mg/dL — ABNORMAL HIGH (ref 2.5–4.6)
Potassium: 5.4 mmol/L — ABNORMAL HIGH (ref 3.5–5.1)
Sodium: 135 mmol/L (ref 135–145)

## 2019-08-20 LAB — GLUCOSE, CAPILLARY
Glucose-Capillary: 106 mg/dL — ABNORMAL HIGH (ref 70–99)
Glucose-Capillary: 107 mg/dL — ABNORMAL HIGH (ref 70–99)
Glucose-Capillary: 118 mg/dL — ABNORMAL HIGH (ref 70–99)
Glucose-Capillary: 137 mg/dL — ABNORMAL HIGH (ref 70–99)
Glucose-Capillary: 150 mg/dL — ABNORMAL HIGH (ref 70–99)
Glucose-Capillary: 160 mg/dL — ABNORMAL HIGH (ref 70–99)

## 2019-08-20 LAB — CBC
HCT: 32.4 % — ABNORMAL LOW (ref 39.0–52.0)
Hemoglobin: 10.1 g/dL — ABNORMAL LOW (ref 13.0–17.0)
MCH: 28.1 pg (ref 26.0–34.0)
MCHC: 31.2 g/dL (ref 30.0–36.0)
MCV: 90 fL (ref 80.0–100.0)
Platelets: 456 10*3/uL — ABNORMAL HIGH (ref 150–400)
RBC: 3.6 MIL/uL — ABNORMAL LOW (ref 4.22–5.81)
RDW: 14 % (ref 11.5–15.5)
WBC: 11.4 10*3/uL — ABNORMAL HIGH (ref 4.0–10.5)
nRBC: 0 % (ref 0.0–0.2)

## 2019-08-20 LAB — VALPROIC ACID LEVEL: Valproic Acid Lvl: 52 ug/mL (ref 50.0–100.0)

## 2019-08-20 SURGERY — REMOVAL PORT-A-CATH
Anesthesia: General

## 2019-08-20 MED ORDER — LEVETIRACETAM IN NACL 500 MG/100ML IV SOLN
500.0000 mg | Freq: Two times a day (BID) | INTRAVENOUS | Status: DC
  Administered 2019-08-20 – 2019-08-22 (×5): 500 mg via INTRAVENOUS
  Filled 2019-08-20 (×5): qty 100

## 2019-08-20 MED ORDER — SODIUM CHLORIDE 0.9 % IV SOLN
250.0000 mg | Freq: Once | INTRAVENOUS | Status: AC
  Administered 2019-08-20: 250 mg via INTRAVENOUS
  Filled 2019-08-20: qty 2.5

## 2019-08-20 MED ORDER — LORAZEPAM 2 MG/ML IJ SOLN
2.0000 mg | INTRAMUSCULAR | Status: DC | PRN
  Administered 2019-08-20: 1 mg via INTRAVENOUS
  Administered 2019-08-22 – 2019-08-23 (×4): 2 mg via INTRAVENOUS
  Filled 2019-08-20 (×9): qty 1

## 2019-08-20 MED ORDER — LORAZEPAM 2 MG/ML IJ SOLN
1.0000 mg | Freq: Once | INTRAMUSCULAR | Status: AC
  Administered 2019-08-20: 10:00:00 1 mg via INTRAVENOUS
  Filled 2019-08-20: qty 1

## 2019-08-20 MED ORDER — VALPROATE SODIUM 500 MG/5ML IV SOLN
500.0000 mg | Freq: Three times a day (TID) | INTRAVENOUS | Status: DC
  Administered 2019-08-20 – 2019-08-21 (×2): 500 mg via INTRAVENOUS
  Filled 2019-08-20 (×4): qty 5

## 2019-08-20 MED ORDER — HEPARIN SODIUM (PORCINE) 1000 UNIT/ML IJ SOLN
INTRAMUSCULAR | Status: AC
  Filled 2019-08-20: qty 4

## 2019-08-20 MED ORDER — LORAZEPAM 2 MG/ML IJ SOLN
INTRAMUSCULAR | Status: AC
  Administered 2019-08-20: 11:00:00 1 mg via INTRAVENOUS
  Filled 2019-08-20: qty 1

## 2019-08-20 MED ORDER — LORAZEPAM 2 MG/ML IJ SOLN
1.0000 mg | Freq: Once | INTRAMUSCULAR | Status: AC
  Administered 2019-08-20: 11:00:00 1 mg via INTRAVENOUS

## 2019-08-20 MED ORDER — CLONAZEPAM 1 MG PO TABS
1.0000 mg | ORAL_TABLET | Freq: Three times a day (TID) | ORAL | Status: DC
  Administered 2019-08-20 – 2019-08-21 (×6): 1 mg
  Filled 2019-08-20 (×6): qty 1

## 2019-08-20 MED ORDER — VALPROATE SODIUM 500 MG/5ML IV SOLN
2000.0000 mg | Freq: Once | INTRAVENOUS | Status: AC
  Administered 2019-08-20: 2000 mg via INTRAVENOUS
  Filled 2019-08-20: qty 20

## 2019-08-20 NOTE — Progress Notes (Signed)
PROGRESS NOTE    Larry Banks  JKK:[id number] DOB: 07/07/1971 DOA: 07/26/2019 PCP: Curlene Labrum, MD   Brief Narrative:  48 year old with history of essential hypertension, peripheral arterial disease, hyperlipidemia, CKD stage V developed shortness of breath at home came to any pain hospital.  Usually follows with [facility] and recently was transitioned to peritoneal dialysis.  Upon arrival to the ER he went into cardiac arrest with asystole requiring CPR.  Transferred to ICU after ROSC 15 minutes later.  Initially required pressors.  HD catheter placed 8/26.  CT of the head was negative.  CT of the abdomen pelvis initially showed bilateral lower lobes pleural effusions nonobstructive renal stone.  Echocardiogram showed ejection fraction 45-50% with hypokinesis.  EEG showed profound slowing of brain activity but no seizure.  CT of the head repeated on 9/1 showed severe diffuse brain edema with widespread hypoxic injury.  Repeat EEG 9/2 showed profound diffuse encephalopathy secondary to hypoxic brain injury.  MRI brain 9/3 showed hypoxic diffuse brain injury.  Most of the cultures remain negative except sputum culture showed above-noted gram-negative rods.  Initially has received Unasyn, cefepime and vancomycin but currently off antibiotics.  Intubated from 8/29 to 9/10.   Assessment & Plan:   Active Problems:   Cardiac arrest ([place])   Encounter for central line placement   Acute respiratory failure (HCC)   Ventilator dependence ([place])   ESRD needing dialysis ([place])   Anoxic brain damage (HCC)   PEA cardiac arrest Encephalopathy secondary to anoxic brain injury Intermittent focal seizure -CT of the head/MRI and EEG are consistent with anoxic brain injury. -Supportive care. -Status post tracheostomy.  Standard care only -Echocardiogram showed ejection fraction 45-50% with hypokinesis - Keppra 500 mg twice daily, clonazepam 1 mg 3 times daily.  Neurology reconsulted  for their input.  Moderate protein calorie malnutrition Dysphagia due to encephalopathy -Plan for PEG tube placement but postponed for tomorrow due to renal function and seizure-like activity overnight.  ESRD on hemodialysis -Nephrology following.  Has right chest wall catheter.  Plans for dialysis today.  Anemia of chronic disease/iron deficiency -Apparently developed angioedema reaction secondary to iron infusion.  We will hold off on this.  Monitor hemoglobin  Diabetes mellitus type 2, insulin-dependent -Resume home meds.  Insulin sliding scale Accu-Chek -Lantus 30 units daily -NovoLog 3 units every 4 hours  Essential hypertension with sinus tachycardia -On amlodipine and metoprolol.  DVT prophylaxis: Subcu heparin Code Status: Full code Family Communication: Spoke with patient's wife over camera set up in his room disposition Plan: To be determined  Consultants:   Nephrology  General surgery  [name]  Antimicrobials:  Unasyn 8/29 >9/1 Cefepime 9/5> 9/11 vanc 9/5 > 9/9   Subjective: Remains unresponsive.  Seizure-like activity overnight lasting 1.5 minutes.  This morning during my evaluation he was having twitching of his face and right upper extremity lasting for about 1 minute with spontaneous resolution of it.  Review of Systems Otherwise negative except as per HPI, including: Unable to obtain Objective: Vitals:   08/25/2019 1132 08/28/2019 1142 08/30/2019 1200 08/06/2019 1300  BP:  (!) 125/91 114/89 (!) 116/96  Pulse:  100 99 87  Resp:  18 20 (!) 24  Temp: 98.3 F (36.8 C)     TempSrc: Oral     SpO2:  100% 100% 100%  Weight:      Height:        Intake/Output Summary (Last 24 hours) at 09/01/2019 1315 Last data filed at 08/08/2019  1300 Gross per 24 hour  Intake 718.08 ml  Output 43 ml  Net 675.08 ml   Filed Weights   08/18/19 0200 08/19/19 0500 08/27/2019 0726  Weight: 82.8 kg 81.2 kg 85.1 kg    Examination:  General exam: Feeding tube in place.   Remains unresponsive Respiratory system: Tracheostomy in place.  Bibasilar crackles Cardiovascular system: Tachycardia Gastrointestinal system: PD catheter in place without any evidence of obvious infection.  Positive bowel sounds Central nervous system: Unable to perform due to his mentation Extremities: No contractures Skin: No rashes, lesions or ulcers Psychiatry: Very difficult to assess  Condom catheter in place Tracheostomy Right chest wall hemodialysis catheter    Data Reviewed:   CBC: Recent Labs  Lab 08/15/19 0636 08/17/19 0743 08/18/19 0256 08/19/19 0220 08/12/2019 0239  WBC 15.4* 14.7* 13.8* 11.5* 11.4*  HGB 9.8* 9.8* 9.7* 10.6* 10.1*  HCT 31.6* 31.8* 30.0* 34.2* 32.4*  MCV 90.8 90.6 89.3 90.2 90.0  PLT 401* 467* 458* 481* 502*   Basic Metabolic Panel: Recent Labs  Lab 08/14/19 0324 08/15/19 0339 08/16/19 1628 08/17/19 0743 08/18/19 0256 08/18/19 1208 08/19/19 0220 09/01/2019 0537  NA 137 139 135 136 137  --  136  136 135  K 4.1 4.2 5.0 5.3* 4.8  --  4.4  4.4 5.4*  CL 96* 99 92* 92* 92*  --  92*  92* 90*  CO2 22 20* 21* 19* 19*  --  22  21* 21*  GLUCOSE 177* 187* 177* 208* 180*  --  191*  191* 110*  BUN 79* 128* 128* 157* 201* 60* 109*  104* 179*  CREATININE 6.62* 8.77* 7.90* 10.03* 11.54*  --  6.79*  6.70* 10.11*  CALCIUM 9.3 9.8 9.7 9.6 9.4  --  9.7  9.6 9.5  MG 2.6* 2.9*  --   --   --   --  2.8*  --   PHOS  --  7.1*  --  9.2* 10.8*  --  6.3* 9.6*   GFR: Estimated Creatinine Clearance: 9 mL/min (A) (by C-G formula based on SCr of 10.11 mg/dL (H)). Liver Function Tests: Recent Labs  Lab 08/17/19 0743 08/18/19 0256 08/19/19 0220 08/08/2019 0537  ALBUMIN 2.9* 2.8* 3.2* 3.1*   No results for input(s): LIPASE, AMYLASE in the last 168 hours. No results for input(s): AMMONIA in the last 168 hours. Coagulation Profile: No results for input(s): INR, PROTIME in the last 168 hours. Cardiac Enzymes: No results for input(s): CKTOTAL, CKMB,  CKMBINDEX, TROPONINI in the last 168 hours. BNP (last 3 results) No results for input(s): PROBNP in the last 8760 hours. HbA1C: No results for input(s): HGBA1C in the last 72 hours. CBG: Recent Labs  Lab 08/19/19 2015 09/02/2019 0003 08/23/2019 0414 08/19/2019 0728 08/14/2019 1129  GLUCAP 156* 160* 106* 118* 137*   Lipid Profile: No results for input(s): CHOL, HDL, LDLCALC, TRIG, CHOLHDL, LDLDIRECT in the last 72 hours. Thyroid Function Tests: No results for input(s): TSH, T4TOTAL, FREET4, T3FREE, THYROIDAB in the last 72 hours. Anemia Panel: No results for input(s): VITAMINB12, FOLATE, FERRITIN, TIBC, IRON, RETICCTPCT in the last 72 hours. Sepsis Labs: Recent Labs  Lab 08/17/19 0751  PROCALCITON 7.29    Recent Results (from the past 240 hour(s))  Expectorated sputum assessment w rflx to resp cult     Status: None   Collection Time: 08/16/19  3:29 PM   Specimen: Tracheal Aspirate; Sputum  Result Value Ref Range Status   Specimen Description TRACHEAL ASPIRATE  Final  Special Requests NONE  Final   Sputum evaluation   Final    THIS SPECIMEN IS ACCEPTABLE FOR SPUTUM CULTURE Performed at [facility], [place] [address], [place], [place] [address]    Report Status 08/16/2019 FINAL  Final  Culture, respiratory     Status: None   Collection Time: 08/16/19  3:29 PM   Specimen: Tracheal Aspirate  Result Value Ref Range Status   Specimen Description TRACHEAL ASPIRATE  Final   Special Requests NONE Reflexed from [id number]  Final   Gram Stain   Final    ABUNDANT SQUAMOUS EPITHELIAL CELLS PRESENT ABUNDANT WBC PRESENT, PREDOMINANTLY PMN ABUNDANT GRAM NEGATIVE RODS RARE GRAM POSITIVE COCCI    Culture   Final    FEW Consistent with normal respiratory flora. Performed at [facility], [place] [address], [place], [place] [address]    Report Status 08/19/2019 FINAL  Final  Culture, blood (routine x 2)     Status: None (Preliminary result)   Collection Time: 08/16/19  4:28 PM    Specimen: BLOOD  Result Value Ref Range Status   Specimen Description BLOOD LEFT ANTECUBITAL  Final   Special Requests AEROBIC BOTTLE ONLY Blood Culture adequate volume  Final   Culture   Final    NO GROWTH 4 DAYS Performed at [place] [address], [place], [place] [address]    Report Status PENDING  Incomplete  Culture, blood (routine x 2)     Status: None (Preliminary result)   Collection Time: 08/16/19  4:39 PM   Specimen: BLOOD  Result Value Ref Range Status   Specimen Description BLOOD BLOOD RIGHT HAND  Final   Special Requests AEROBIC BOTTLE ONLY Blood Culture adequate volume  Final   Culture   Final    NO GROWTH 4 DAYS Performed at [facility], [place] [address], [place],  [address]    Report Status PENDING  Incomplete         Radiology Studies: Dg Chest Port 1 View  Result Date: 08/19/2019 CLINICAL DATA:  Tracheostomy/ventilation present, acute respiratory failure. EXAM: PORTABLE CHEST 1 VIEW COMPARISON:  08/16/2019 FINDINGS: Tracheostomy tube unchanged. Enteric tube courses into the stomach and off the film as tip is not visualized. Right IJ central venous catheter unchanged with tip over the right atrium. Lungs are hypoinflated and otherwise clear. Cardiomediastinal silhouette and remainder of the exam is unchanged. IMPRESSION: Hypoinflation without acute cardiopulmonary disease. Tubes and lines as described. Electronically Signed   By: Marin Olp M.D.   On: 08/19/2019 07:24        Scheduled Meds: . amLODipine  5 mg Per Tube Daily  . calcium acetate (Phos Binder)  1,334 mg Per Tube TID WC  . chlorhexidine gluconate (MEDLINE KIT)  15 mL Mouth Rinse BID  . Chlorhexidine Gluconate Cloth  6 each Topical Daily  . clonazePAM  1 mg Per Tube TID  . darbepoetin (ARANESP) injection - DIALYSIS  60 mcg Intravenous Q Tue-HD  . famotidine  10 mg Per Tube BID  . fentaNYL (SUBLIMAZE) injection  200 mcg Intravenous Once  . heparin      . insulin  aspart  0-20 Units Subcutaneous Q4H  . insulin aspart  3 Units Subcutaneous Q4H  . insulin glargine  30 Units Subcutaneous Daily  . mouth rinse  15 mL Mouth Rinse 10 times per day  . metoprolol tartrate  12.5 mg Per NG tube BID  . sodium chloride flush  3 mL  Intravenous Q12H   Continuous Infusions: . sodium chloride 250 mL (08/16/19 1736)  . sodium chloride    . sodium chloride    . sodium chloride    . sodium chloride    . sodium chloride Stopped (08/18/19 1031)  . sodium chloride    . sodium chloride    . levETIRAcetam       LOS: 19 days   Time spent= 35 mins    Daimen Shovlin Arsenio Loader, MD [facility]  If 7PM-7AM, please contact night-coverage [email or web address] 08/14/2019, 1:15 PM

## 2019-08-20 NOTE — Progress Notes (Addendum)
Reason for consult: Seizure  Interim summary: 48 year old male with anoxic injury status post cardiac arrest.  He received  targeted temperature management.  MRI showed restricted diffusion throughout the entire cortex.  He also had early myoclonus.  Neurology last saw him on 08/08/2019 and stated that his overall prognosis and chances of recovery to an independent degree of function are poor.  Since then he underwent tracheostomy.  However he now started having seizures again described gaze deviation to either left or right as well as rhythmic twitching of upper and lower extremity lasting about 1 to 2 minutes. Therefore neurology was reconsulted.  Subjective: Patient had another episode of right gaze deviation and right upper and lower extremity rhythmic twitching this morning.  He has received IV Ativan 1 mg x 2.  ROS: Unable to obtain due to poor mental status  Examination  Vital signs in last 24 hours: Temp:  [98 F (36.7 C)-99.6 F (37.6 C)] 98 F (36.7 C) (09/17 0730) Pulse Rate:  [96-127] 127 (09/17 1115) Resp:  [13-43] 18 (09/17 1115) BP: (102-135)/(76-116) 127/96 (09/17 1115) SpO2:  [84 %-100 %] 100 % (09/17 1115) FiO2 (%):  [28 %] 28 % (09/17 0756) Weight:  [85.1 kg] 85.1 kg (09/17 0726)  General: lying in bed, NAD CVS: pulse-normal rate and rhythm RS: breathing comfortably, +Trach Extremities: normal, warm  Neuro: MS: Opens eyes to noxious stimuli and intermittently to verbal stimuli, does not track, does not follow commands CN: pupils equal and reactive, oculocephalic reflex intact, corneal reflex intact, no apparent facial asymmetry, unable to assess rest of the cranial nerves due to encephalopathy  Motor: 3/5 in all extremities, lower extremities more than upper extremity  reflexes: 1+ bilaterally over patella, biceps Coordination: Unable to assess secondary to encephalopathy Gait: not tested  Basic Metabolic Panel: Recent Labs  Lab 08/14/19 0324 08/15/19 0339  08/16/19 1628 08/17/19 0743 08/18/19 0256 08/18/19 1208 08/19/19 0220 08/11/2019 0537  NA 137 139 135 136 137  --  136  136 135  K 4.1 4.2 5.0 5.3* 4.8  --  4.4  4.4 5.4*  CL 96* 99 92* 92* 92*  --  92*  92* 90*  CO2 22 20* 21* 19* 19*  --  22  21* 21*  GLUCOSE 177* 187* 177* 208* 180*  --  191*  191* 110*  BUN 79* 128* 128* 157* 201* 60* 109*  104* 179*  CREATININE 6.62* 8.77* 7.90* 10.03* 11.54*  --  6.79*  6.70* 10.11*  CALCIUM 9.3 9.8 9.7 9.6 9.4  --  9.7  9.6 9.5  MG 2.6* 2.9*  --   --   --   --  2.8*  --   PHOS  --  7.1*  --  9.2* 10.8*  --  6.3* 9.6*    CBC: Recent Labs  Lab 08/15/19 0636 08/17/19 0743 08/18/19 0256 08/19/19 0220 08/22/2019 0239  WBC 15.4* 14.7* 13.8* 11.5* 11.4*  HGB 9.8* 9.8* 9.7* 10.6* 10.1*  HCT 31.6* 31.8* 30.0* 34.2* 32.4*  MCV 90.8 90.6 89.3 90.2 90.0  PLT 401* 467* 458* 481* 456*     Coagulation Studies: No results for input(s): LABPROT, INR in the last 72 hours.  Imaging  MRI brain without contrast 08/06/2019:  Diffuse hypoxic ischemic injury most pronounced affecting the cerebral hemispheric cortex but with some involvement also of the basal ganglia and thalami.   ASSESSMENT AND PLAN: 48 year old male status post cardiac arrest and tracheostomy now with seizure-like activity.   Cardiac arrest Anoxic  brain injury Status post trach Seizures Encephalopathy secondary to diffuse anoxic brain injury -From the description of the episode, they very concerning for seizures likely secondary to anoxic brain injury. -Patient was started on Keppra 250 mg twice daily.  However it appears back that he had at least one most seizure after receiving this dose.  Recommendations -Will obtain EEG and start on LTM for characterization of seizures, monitor for any subclinical seizures -We will increase Keppra to 500 mg twice daily.  It is difficult to increase further due to significant renal impairment -We will also start clonazepam 1 mg 3 times  a day -If patient continues to have seizures the dose of clonazepam can be increased upto 2 mg 3 times a day -It is difficult to say at this point if we will be able to control the seizures with 1-3 antiepileptics or not.  However, given the extent of his brain injury, if he very frequent seizures, I would preferably avoid putting him in burst suppression as this may not affect/improve his quality of life -I spoke with patient's wife on the video in the room and updated her about management and plan. -Seizure precautions PRN IV Ativan 2 mg only to be given for generalized tonic-clonic seizure lasting more than 3 minutes.  Please let neurology team know this is administered   Addendum  - patient continues to have seizures and was loaded with IV VPA and started maintenance 500mg  Q8h - We will have to dicuss with family that this is likely a sign of neurologic worsening. Next step is usually burst suppression but in a patient with diffuse anoxic injury and poor clinical exam, this will likely worsen his recovery and may not help him - Consider ethics consult to discuss if further escalation of care is warranted and beneficial at this time   Thank you for allowing [place] to participate in the care of this patient.  Please page the neuro hospitalist for any further questions.   I have spent a total of  35  minutes with the patient reviewing hospital notes,  test results, labs and examining the patient as well as establishing an assessment and plan that was discussed personally with the patient's wife.  > 50% of time was spent in direct patient care.

## 2019-08-20 NOTE — Progress Notes (Signed)
EEG completed, results pending. 

## 2019-08-20 NOTE — Progress Notes (Addendum)
[facility]    NEPHROLOGY PROGRESS NOTE  SUBJECTIVE:  Initially was going to OR for PD catheter--> then had a couple of seizures in front of team, has been on Keppra, resolved with Ativan.  Neuro called by primary team.  OBJECTIVE:  Vitals:   08/23/2019 0800 08/28/2019 0900  BP: 122/88 115/88  Pulse: 98 99  Resp: 19 13  Temp:    SpO2: 100% 99%    Intake/Output Summary (Last 24 hours) at 08/31/2019 0934 Last data filed at 08/17/2019 0900 Gross per 24 hour  Intake 927.98 ml  Output 43 ml  Net 884.98 ml      General: NAD, unresponsive HEENT: eyes closed Neck:  No JVD, trach in place CV:  Heart RRR, Lungs:  Clear anteriorly Abd:  abd SNT/ND with normal BS, some distention but soft Extremities:  No LE edema. ACCESS: RLQ PD cath, R IJ TDC  MEDICATIONS:  . amLODipine  5 mg Per Tube Daily  . calcium acetate (Phos Binder)  1,334 mg Per Tube TID WC  . chlorhexidine gluconate (MEDLINE KIT)  15 mL Mouth Rinse BID  . Chlorhexidine Gluconate Cloth  6 each Topical Daily  . darbepoetin (ARANESP) injection - DIALYSIS  60 mcg Intravenous Q Tue-HD  . famotidine  10 mg Per Tube BID  . fentaNYL (SUBLIMAZE) injection  200 mcg Intravenous Once  . heparin      . insulin aspart  0-20 Units Subcutaneous Q4H  . insulin aspart  3 Units Subcutaneous Q4H  . insulin glargine  30 Units Subcutaneous Daily  . LORazepam  1 mg Intravenous Once  . mouth rinse  15 mL Mouth Rinse 10 times per day  . metoprolol tartrate  12.5 mg Per NG tube BID  . sodium chloride flush  3 mL Intravenous Q12H       LABS:   CBC Latest Ref Rng & Units 08/04/2019 08/19/2019 08/18/2019  WBC 4.0 - 10.5 K/uL 11.4(H) 11.5(H) 13.8(H)  Hemoglobin 13.0 - 17.0 g/dL 10.1(L) 10.6(L) 9.7(L)  Hematocrit 39.0 - 52.0 % 32.4(L) 34.2(L) 30.0(L)  Platelets 150 - 400 K/uL 456(H) 481(H) 458(H)    CMP Latest Ref Rng & Units 08/17/2019 08/19/2019 08/19/2019  Glucose 70 - 99 mg/dL 110(H) 191(H) 191(H)  BUN 6 - 20 mg/dL 179(H)  109(H) 104(H)  Creatinine 0.61 - 1.24 mg/dL 10.11(H) 6.79(H) 6.70(H)  Sodium 135 - 145 mmol/L 135 136 136  Potassium 3.5 - 5.1 mmol/L 5.4(H) 4.4 4.4  Chloride 98 - 111 mmol/L 90(L) 92(L) 92(L)  CO2 22 - 32 mmol/L 21(L) 22 21(L)  Calcium 8.9 - 10.3 mg/dL 9.5 9.7 9.6  Total Protein 6.5 - 8.1 g/dL - - -  Total Bilirubin 0.3 - 1.2 mg/dL - - -  Alkaline Phos 38 - 126 U/L - - -  AST 15 - 41 U/L - - -  ALT 0 - 44 U/L - - -    Lab Results  Component Value Date   PTH 93 (H) 07/04/2019   CALCIUM 9.5 08/27/2019   CAION 1.00 (L) 08/03/2019   PHOS 9.6 (H) 08/14/2019       Component Value Date/Time   COLORURINE YELLOW 08/08/2019 0106   APPEARANCEUR CLEAR 08/08/2019 0106   LABSPEC 1.014 08/08/2019 0106   PHURINE 5.0 08/08/2019 0106   GLUCOSEU NEGATIVE 08/08/2019 0106   HGBUR MODERATE (A) 08/08/2019 0106   BILIRUBINUR NEGATIVE 08/08/2019 0106   KETONESUR NEGATIVE 08/08/2019 0106   PROTEINUR 100 (A) 08/08/2019 0106   NITRITE NEGATIVE 08/08/2019 0106  LEUKOCYTESUR NEGATIVE 08/08/2019 0106      Component Value Date/Time   PHART 7.424 08/03/2019 0651   PCO2ART 42.0 08/03/2019 0651   PO2ART 133.0 (H) 08/03/2019 0651   HCO3 27.5 08/03/2019 0651   TCO2 29 08/03/2019 0651   ACIDBASEDEF 1.3 07/06/2019 0755   O2SAT 99.0 08/03/2019 0651       Component Value Date/Time   IRON 8 (L) 07/11/2019 1336   TIBC 307 07/16/2019 1336   IRONPCTSAT 3 (L) 07/15/2019 1336       ASSESSMENT/PLAN:     1. ESRD THS Via R IJ TDC with [facility].  Marked Bun elevation noted, likely from catabolic state/ TF.  HD MWF schedule, next HD today. 2. Seizures: on Keppra, had recurrence early this AM, resolved with Ativan, neuro going to come back 3. S/p PD cath placement 8/27: no longer a PD candidate given anoxic brain injury.  This is a nidus of infection.  Given low-grade temp spikes, need for PEG tube (which can increase risk of peritonitis if both are present), PD cath needs to be removed.  Appreciate the assistance  of gen surg--> on hold d/t seizures.  4. VDRF - on TC this AM 5. Anoxic brain injury - continue scope of care with all aggressive  6. Hyperphosphatemia on binders 7. Fevers/Leukocytsois, B Cx NGTD, s/p CEfepime/Vanc; fever curve has improved overall; noting continued elevated temps with Tmax yesterday 99.9. 8. Anemia of CKD.  Hgb stable.  Iron low, had angioedema to ferrlicet vs antibiotic.  Listed as allergy, d/w pharmacy. ON aranesp 60 q week

## 2019-08-20 NOTE — Progress Notes (Signed)
Treatment terminated after 3 hours and 6 minutes due to system clotting. Machine re-strung once but clotted again within minutes.

## 2019-08-20 NOTE — Progress Notes (Signed)
Per report, pt spouse did not want pt to have an IV attempt without her permission. IV difficult to flush this AM for Keppra dose. Spouse called, permission rec'd to obtain access- secondary to wife's concern regarding the potential number of attempts, consult initiated for IV team

## 2019-08-20 NOTE — Progress Notes (Signed)
EEG LTM running following spot EEG. Notified Neuro

## 2019-08-20 NOTE — Progress Notes (Addendum)
[facility] Office:  [phone] General Surgery Progress Note   LOS: 19 days  POD -  Day of Surgery  Chief Complaint: Anoxic brain injury  Assessment and Plan: 1.  Plan for g tube for long term feeding and removal of recently placed PD catheter  Patient seized this AM - IV Keppra started, labs don't look as good as yesterday  Discussed with anesthesia - will delay surgery until tomorrow  Get dialysis today, get neurology's input  2.  Anoxic brain injury  Seizure on 9/17 (actually seizing now) 3.  End-stage renal disease-on hemodialysis now             He is currently on dialysis TThS 4.  Has trach 5.  Type 2 diabetes 6.  Hypertension 7.  Hyperlipidemia 8.  Recently had a PD catheter placed on 07/30/2019 - at [facility]    Active Problems:   Cardiac arrest [facility])   Encounter for central line placement   Acute respiratory failure ([place])   Ventilator dependence ([place])   ESRD needing dialysis ([place])   Anoxic brain damage (HCC)   Subjective:  Does not respond.  I talked to his wife about delaying surgery for now.  Probably plan for surgery tomorrow.  Objective:   Vitals:   08/12/2019 0730 08/19/2019 0756  BP:    Pulse:  (!) 105  Resp:  (!) 21  Temp: 98 F (36.7 C)   SpO2:  97%     Intake/Output from previous day:  09/16 0701 - 09/17 0700 In: 903.6 [NG/GT:710; IV Piggyback:193.6] Out: 43 [Urine:43]  Intake/Output this shift:  No intake/output data recorded.   Physical Exam:   General: AA M who has a trach and does not respond   HEENT: Normal. Pupils equal. .   Lungs: Clear   Abdomen: PD catheter   Lab Results:    Recent Labs    08/19/19 0220 08/22/2019 0239  WBC 11.5* 11.4*  HGB 10.6* 10.1*  HCT 34.2* 32.4*  PLT 481* 456*    BMET   Recent Labs    08/19/19 0220 08/09/2019 0537  NA 136  136 135  K 4.4  4.4 5.4*  CL 92*  92* 90*  CO2 22  21* 21*  GLUCOSE 191*  191* 110*  BUN 109*  104* 179*  CREATININE 6.79*   6.70* 10.11*  CALCIUM 9.7  9.6 9.5    PT/INR  No results for input(s): LABPROT, INR in the last 72 hours.  ABG  No results for input(s): PHART, HCO3 in the last 72 hours.  Invalid input(s): PCO2, PO2   Studies/Results:  Dg Chest Port 1 View  Result Date: 08/19/2019 CLINICAL DATA:  Tracheostomy/ventilation present, acute respiratory failure. EXAM: PORTABLE CHEST 1 VIEW COMPARISON:  08/16/2019 FINDINGS: Tracheostomy tube unchanged. Enteric tube courses into the stomach and off the film as tip is not visualized. Right IJ central venous catheter unchanged with tip over the right atrium. Lungs are hypoinflated and otherwise clear. Cardiomediastinal silhouette and remainder of the exam is unchanged. IMPRESSION: Hypoinflation without acute cardiopulmonary disease. Tubes and lines as described. Electronically Signed   By: Marin Olp M.D.   On: 08/19/2019 07:24     Anti-infectives:   Anti-infectives (From admission, onward)   Start     Dose/Rate Route Frequency Ordered Stop   08/12/19 1200  ceFEPIme (MAXIPIME) 1 g in sodium chloride 0.9 % 100 mL IVPB     1 g 200 mL/hr over 30 Minutes Intravenous Every 24  hours 08/12/19 0930 08/14/19 1142   08/12/19 1147  ceFEPIme (MAXIPIME) 1 g in sodium chloride 0.9 % 100 mL IVPB  Status:  Discontinued     1 g 200 mL/hr over 30 Minutes Intravenous Every 24 hours 08/11/19 1603 08/12/19 0930   08/11/19 1200  vancomycin (VANCOCIN) IVPB 1000 mg/200 mL premix  Status:  Discontinued     1,000 mg 200 mL/hr over 60 Minutes Intravenous Every T-Th-Sa (Hemodialysis) 08/08/19 1054 08/12/19 0857   08/08/19 1300  vancomycin (VANCOCIN) 2,000 mg in sodium chloride 0.9 % 500 mL IVPB     2,000 mg 250 mL/hr over 120 Minutes Intravenous  Once 08/08/19 1054 08/08/19 1632   08/08/19 1200  ceFEPIme (MAXIPIME) 2 g in sodium chloride 0.9 % 100 mL IVPB  Status:  Discontinued     2 g 200 mL/hr over 30 Minutes Intravenous Every T-Th-Sa (Hemodialysis) 08/08/19 1054 08/11/19 1603    07/17/2019 1115  ampicillin-sulbactam (UNASYN) 1.5 g in sodium chloride 0.9 % 100 mL IVPB  Status:  Discontinued     1.5 g 200 mL/hr over 30 Minutes Intravenous Every 12 hours 07/26/2019 1104 08/05/19 0846      Alphonsa Overall, MD, FACS Pager: [facility] Office: [phone] 08/17/2019

## 2019-08-20 NOTE — Progress Notes (Signed)
eLink Physician-Brief Progress Note Patient Name: Larry Banks DOB: 12-Nov-1971 MRN: [id number]   Date of Service  08/27/2019  HPI/Events of Note  Patient seized X 1.5 minutes. Now not seizing. Sat = 100% on TC O2. Now opening eyes.   eICU Interventions  Plan: 1. Please give scheduled Keppra dose.  2. Bedside nurse to contract Neurology who is following patient for further management.      Intervention Category Major Interventions: Seizures - evaluation and management  Sommer,Steven Eugene 08/24/2019, 6:28 AM

## 2019-08-20 NOTE — Progress Notes (Signed)
Assisted tele visit to patient with wife.  Terie Lear, [name], RN

## 2019-08-20 NOTE — Progress Notes (Signed)
As IV team was attempting to obtain access, pt had approx 90 seconds of seizure activity. Pt head and eyes turned right and twitching noted RUE. Elink contacted, seizure resolved as MD came online. Keppra infused when IV access obtained.

## 2019-08-20 NOTE — Procedures (Signed)
Patient Name: Larry Banks  MRN: [id number]  Epilepsy Attending: Lora Havens  Referring Physician/Provider: Dr Zeb Comfort Date: 08/04/2019 Duration: 24.15 mins  Patient history: 48yo M with cardiac arrest. EEG to evaluate for seizure.  Level of alertness: obtunded  AEDs during EEG study: LEV  Technical aspects: This EEG study was done with scalp electrodes positioned according to the 10-20 International system of electrode placement. Electrical activity was acquired at a sampling rate of 500Hz  and reviewed with a high frequency filter of 70Hz  and a low frequency filter of 1Hz . EEG data were recorded continuously and digitally stored.   DESCRIPTION: EEG showed diffuse background suppression. EEG was reactive to tactile stimulation. Hyperventilation and photic stimulation were not performed.  IMPRESSION:  This study is suggestive of severe to profound diffuse encephalopathy. No seizures or definite  epileptiform discharges were seen throughout the recording.  Carvel Huskins [name]

## 2019-08-20 NOTE — Progress Notes (Signed)
Assisted tele visit to patient with wife.  Schawn Byas R, RN  

## 2019-08-20 NOTE — Anesthesia Preprocedure Evaluation (Deleted)
Anesthesia Evaluation  Patient identified by MRN, date of birth, ID band Patient awake    Reviewed: Allergy & Precautions, NPO status , Patient's Chart, lab work & pertinent test results  Airway        Dental   Pulmonary neg pulmonary ROS,           Cardiovascular hypertension, negative cardio ROS       Neuro/Psych negative neurological ROS  negative psych ROS   GI/Hepatic negative GI ROS, Neg liver ROS,   Endo/Other  negative endocrine ROSdiabetes  Renal/GU negative Renal ROS  negative genitourinary   Musculoskeletal negative musculoskeletal ROS (+)   Abdominal   Peds  Hematology  (+) Blood dyscrasia (Hgb 10.1), anemia ,   Anesthesia Other Findings   Reproductive/Obstetrics                             Anesthesia Physical Anesthesia Plan Anesthesia Quick Evaluation

## 2019-08-21 ENCOUNTER — Encounter (HOSPITAL_COMMUNITY): Payer: Self-pay | Admitting: Certified Registered"

## 2019-08-21 ENCOUNTER — Encounter (HOSPITAL_COMMUNITY): Admission: EM | Disposition: E | Payer: 59 | Source: Home / Self Care | Attending: Emergency Medicine

## 2019-08-21 DIAGNOSIS — R569 Unspecified convulsions: Secondary | ICD-10-CM

## 2019-08-21 DIAGNOSIS — Z452 Encounter for adjustment and management of vascular access device: Secondary | ICD-10-CM

## 2019-08-21 LAB — RENAL FUNCTION PANEL
Albumin: 3.1 g/dL — ABNORMAL LOW (ref 3.5–5.0)
Anion gap: 21 — ABNORMAL HIGH (ref 5–15)
BUN: 91 mg/dL — ABNORMAL HIGH (ref 6–20)
CO2: 21 mmol/L — ABNORMAL LOW (ref 22–32)
Calcium: 9.3 mg/dL (ref 8.9–10.3)
Chloride: 92 mmol/L — ABNORMAL LOW (ref 98–111)
Creatinine, Ser: 6.93 mg/dL — ABNORMAL HIGH (ref 0.61–1.24)
GFR calc Af Amer: 10 mL/min — ABNORMAL LOW (ref >60)
GFR calc non Af Amer: 8 mL/min — ABNORMAL LOW (ref >60)
Glucose, Bld: 117 mg/dL — ABNORMAL HIGH (ref 70–99)
Phosphorus: 8.2 mg/dL — ABNORMAL HIGH (ref 2.5–4.6)
Potassium: 5.3 mmol/L — ABNORMAL HIGH (ref 3.5–5.1)
Sodium: 134 mmol/L — ABNORMAL LOW (ref 135–145)

## 2019-08-21 LAB — CBC
HCT: 35 % — ABNORMAL LOW (ref 39.0–52.0)
Hemoglobin: 10.6 g/dL — ABNORMAL LOW (ref 13.0–17.0)
MCH: 27.6 pg (ref 26.0–34.0)
MCHC: 30.3 g/dL (ref 30.0–36.0)
MCV: 91.1 fL (ref 80.0–100.0)
Platelets: 401 10*3/uL — ABNORMAL HIGH (ref 150–400)
RBC: 3.84 MIL/uL — ABNORMAL LOW (ref 4.22–5.81)
RDW: 14.1 % (ref 11.5–15.5)
WBC: 11.6 10*3/uL — ABNORMAL HIGH (ref 4.0–10.5)
nRBC: 0 % (ref 0.0–0.2)

## 2019-08-21 LAB — CULTURE, BLOOD (ROUTINE X 2)
Culture: NO GROWTH
Culture: NO GROWTH
Special Requests: ADEQUATE
Special Requests: ADEQUATE

## 2019-08-21 LAB — BASIC METABOLIC PANEL
Anion gap: 20 — ABNORMAL HIGH (ref 5–15)
BUN: 90 mg/dL — ABNORMAL HIGH (ref 6–20)
CO2: 22 mmol/L (ref 22–32)
Calcium: 9.4 mg/dL (ref 8.9–10.3)
Chloride: 93 mmol/L — ABNORMAL LOW (ref 98–111)
Creatinine, Ser: 6.85 mg/dL — ABNORMAL HIGH (ref 0.61–1.24)
GFR calc Af Amer: 10 mL/min — ABNORMAL LOW (ref >60)
GFR calc non Af Amer: 9 mL/min — ABNORMAL LOW (ref >60)
Glucose, Bld: 120 mg/dL — ABNORMAL HIGH (ref 70–99)
Potassium: 5.3 mmol/L — ABNORMAL HIGH (ref 3.5–5.1)
Sodium: 135 mmol/L (ref 135–145)

## 2019-08-21 LAB — GLUCOSE, CAPILLARY
Glucose-Capillary: 127 mg/dL — ABNORMAL HIGH (ref 70–99)
Glucose-Capillary: 135 mg/dL — ABNORMAL HIGH (ref 70–99)
Glucose-Capillary: 176 mg/dL — ABNORMAL HIGH (ref 70–99)
Glucose-Capillary: 74 mg/dL (ref 70–99)
Glucose-Capillary: 85 mg/dL (ref 70–99)
Glucose-Capillary: 86 mg/dL (ref 70–99)
Glucose-Capillary: 95 mg/dL (ref 70–99)

## 2019-08-21 SURGERY — CONTINUOUS AMBULATORY PERITONEAL DIALYSIS  (CAPD) CATHETER REMOVAL
Anesthesia: General

## 2019-08-21 MED ORDER — VALPROATE SODIUM 500 MG/5ML IV SOLN
500.0000 mg | Freq: Two times a day (BID) | INTRAVENOUS | Status: DC
  Administered 2019-08-21: 21:00:00 500 mg via INTRAVENOUS
  Filled 2019-08-21 (×3): qty 5

## 2019-08-21 MED ORDER — SIMETHICONE 40 MG/0.6ML PO SUSP
ORAL | Status: AC
  Filled 2019-08-21: qty 0.6

## 2019-08-21 MED ORDER — LORAZEPAM 2 MG/ML IJ SOLN
1.0000 mg | Freq: Once | INTRAMUSCULAR | Status: AC
  Administered 2019-08-21: 23:00:00 1 mg via INTRAVENOUS
  Filled 2019-08-21: qty 1

## 2019-08-21 MED ORDER — LORAZEPAM 2 MG/ML IJ SOLN
1.0000 mg | Freq: Once | INTRAMUSCULAR | Status: AC
  Administered 2019-08-21: 21:00:00 1 mg via INTRAVENOUS
  Filled 2019-08-21: qty 1

## 2019-08-21 MED ORDER — SODIUM ZIRCONIUM CYCLOSILICATE 10 G PO PACK
10.0000 g | PACK | Freq: Once | ORAL | Status: AC
  Administered 2019-08-21: 10 g via ORAL
  Filled 2019-08-21: qty 1

## 2019-08-21 MED ORDER — VALPROATE SODIUM 500 MG/5ML IV SOLN
750.0000 mg | INTRAVENOUS | Status: DC
  Administered 2019-08-21 – 2019-08-22 (×2): 750 mg via INTRAVENOUS
  Filled 2019-08-21 (×2): qty 7.5

## 2019-08-21 NOTE — Progress Notes (Signed)
Suture ties on trach were removed without complication.

## 2019-08-21 NOTE — Progress Notes (Signed)
Nutrition Follow-up  DOCUMENTATION CODES:   Not applicable  INTERVENTION:   - RD will follow for [place] discussions  If appropriate, recommend resuming TF: - Nepro @ 40 ml/hr (960 ml/day) via Cortrak - Pro-stat 60 ml BID  Tube feeding regimen provides2128kcal, 138grams of protein, and 678ml of H2O.   NUTRITION DIAGNOSIS:   Increased nutrient needs related to chronic illness (ESRD on dialysis) as evidenced by estimated needs.  Ongoing  GOAL:   Patient will meet greater than or equal to 90% of their needs  Unmet at this time  MONITOR:   Labs, Weight trends, TF tolerance, Skin, I & O's  REASON FOR ASSESSMENT:   Consult Enteral/tube feeding initiation and management  ASSESSMENT:   48 year old male with medical history significant for hypertension, PAD, hyperlipidemia, CKD-stage 5 presented to [facility] emergency room with respiratory distress/hypoxia. On arrival to ER suffered a cardiac arrest with asystole with ROSC in 15 minutes.  9/10 - s/p trach 9/11 - Cortrak placed, tip gastric per Cortrak team  Tube feeding has been held since midnight on 9/16 for possible PEG placement and PD cath removal on 9/17. However, pt has been actively seizing and PEG placement has been postponed. Noted plan for Ethics involvement. Per Neurology note, ongoing seizures are a sign of neurologic worsening and question whether any further escalation of care is beneficial at this time.  General Surgery is holding any surgical intervention at this time and will reassess on Monday.  Spoke with RN who reports TF is not infusing due to concern for aspiration with ongoing seizures.  Pt remains on TC.  Weight down a total of 32 lbs since admit. Pt continues to have edema to BUE and BLE.  Medications reviewed and include: Phoslyra 1334 mg TID, Pepcid, SSI q 4 hours, Novolog 3 units q 4 hours, Lantus 30 units daily, Lokelma 10 grams once, Keppra, Depacon  Labs reviewed: sodium 134, potassium  5.2, phosphorus 8.2 CBG's: 85-150 x 24 hours  HD net UF 9/17: 1700 ml I/O's: -12.0 L since admit  Diet Order:   Diet Order            Diet NPO time specified  Diet effective now              EDUCATION NEEDS:   Not appropriate for education at this time  Skin:  Skin Assessment: Skin Integrity Issues: Skin Integrity Issues: Other: pressure injury to tongue  Last BM:  08/19/19  Height:   Ht Readings from Last 1 Encounters:  08/18/19 5\' 6"  (1.676 m)    Weight:   Wt Readings from Last 1 Encounters:  08/08/2019 78.2 kg    Ideal Body Weight:  64.5 kg  BMI:  Body mass index is 27.83 kg/m.  Estimated Nutritional Needs:   Kcal:  2100-2300  Protein:  120-135 grams  Fluid:  UOP + 1000 ml    Gaynell Face, MS, RD, LDN Inpatient Clinical Dietitian Pager: [phone] Weekend/After Hours: [phone]

## 2019-08-21 NOTE — Progress Notes (Signed)
[facility] Office:  [phone] General Surgery Progress Note   LOS: 20 days  POD -  Day of Surgery  Chief Complaint: Anoxic brain injury  Assessment and Plan: 1.  Plan for possible g tube for long term feeding and removal of recently placed PD catheter  He has done poorly over the last 24 hours.  Seen by neurology, Dr. Hortense Ramal.  I agree with her whether it is worth escalating his care.  To possibly meet with ethics committee.  Discussed with Dr. Reesa Chew - will hold any surgical intervention for now.  Will reassess Monday.  2.  Anoxic brain injury  With seizures which are being treated.  EEG - 08/30/2019 - shows profound diffuse encephalopathy 3.  End-stage renal disease-on hemodialysis now             He is currently on dialysis TThS 4.  Has trach 5.  Type 2 diabetes 6.  Hypertension 7.  Hyperlipidemia 8.  Recently had a PD catheter placed on 07/30/2019 - at [facility]    Active Problems:   Cardiac arrest [facility])   Encounter for central line placement   Acute respiratory failure ([place])   Ventilator dependence ([place])   ESRD needing dialysis ([place])   Anoxic brain damage (HCC)   Subjective:  Does not respond.   Objective:   Vitals:   09/02/2019 0700 08/05/2019 0846  BP: 107/80 98/77  Pulse:  (!) 110  Resp: (!) 25 (!) 22  Temp:    SpO2:  100%     Intake/Output from previous day:  09/17 0701 - 09/18 0700 In: 522.1 [NG/GT:70; IV Piggyback:452.1] Out: 1700   Intake/Output this shift:  No intake/output data recorded.   Physical Exam:   General: AA M who has a trach and does not respond   HEENT: Normal. Pupils equal. .   Lungs: Clear   Abdomen: PD catheter   Lab Results:    Recent Labs    08/15/2019 0239 08/12/2019 0219  WBC 11.4* 11.6*  HGB 10.1* 10.6*  HCT 32.4* 35.0*  PLT 456* 401*    BMET   Recent Labs    08/15/2019 0537 08/31/2019 0219  NA 135 134*  135  K 5.4* 5.3*  5.3*  CL 90* 92*  93*  CO2 21* 21*  22  GLUCOSE 110*  117*  120*  BUN 179* 91*  90*  CREATININE 10.11* 6.93*  6.85*  CALCIUM 9.5 9.3  9.4    PT/INR  No results for input(s): LABPROT, INR in the last 72 hours.  ABG  No results for input(s): PHART, HCO3 in the last 72 hours.  Invalid input(s): PCO2, PO2   Studies/Results:  No results found.   Anti-infectives:   Anti-infectives (From admission, onward)   Start     Dose/Rate Route Frequency Ordered Stop   08/12/19 1200  ceFEPIme (MAXIPIME) 1 g in sodium chloride 0.9 % 100 mL IVPB     1 g 200 mL/hr over 30 Minutes Intravenous Every 24 hours 08/12/19 0930 08/14/19 1142   08/12/19 1147  ceFEPIme (MAXIPIME) 1 g in sodium chloride 0.9 % 100 mL IVPB  Status:  Discontinued     1 g 200 mL/hr over 30 Minutes Intravenous Every 24 hours 08/11/19 1603 08/12/19 0930   08/11/19 1200  vancomycin (VANCOCIN) IVPB 1000 mg/200 mL premix  Status:  Discontinued     1,000 mg 200 mL/hr over 60 Minutes Intravenous Every T-Th-Sa (Hemodialysis) 08/08/19 1054 08/12/19 0857   08/08/19 1300  vancomycin (VANCOCIN) 2,000 mg in sodium chloride 0.9 % 500 mL IVPB     2,000 mg 250 mL/hr over 120 Minutes Intravenous  Once 08/08/19 1054 08/08/19 1632   08/08/19 1200  ceFEPIme (MAXIPIME) 2 g in sodium chloride 0.9 % 100 mL IVPB  Status:  Discontinued     2 g 200 mL/hr over 30 Minutes Intravenous Every T-Th-Sa (Hemodialysis) 08/08/19 1054 08/11/19 1603   07/30/2019 1115  ampicillin-sulbactam (UNASYN) 1.5 g in sodium chloride 0.9 % 100 mL IVPB  Status:  Discontinued     1.5 g 200 mL/hr over 30 Minutes Intravenous Every 12 hours 07/17/2019 1104 08/05/19 0846      Alphonsa Overall, MD, FACS Pager: [facility] Office: [phone] 08/25/2019

## 2019-08-21 NOTE — Progress Notes (Signed)
vLTM EEG maintenance complete. No skin breakdown at [place] FP2 A1 continue to monitor

## 2019-08-21 NOTE — Progress Notes (Addendum)
Palliative:  HPI: 48 y.o.malewith past medical history of PAD, HLD, ESRD began HD 2 weeks prior to admission with plan to transition to peritoneal dialysis, peritoneal catheter placed 8/27admitted on 8/29/2020with SOB and PEA arrest en-route to ED with ROSC after 15 min down.Continues vent dependent and tolerating HD. Hospitalization complicated further by fevers/sepsis and angioedema of unknown etiology. Unfortunately MRI brain and neurological assessment concerning for extensive anoxic brain injury with poor prognosis for significant neurological recovery.Trach placed 9/10.Wean to trach collar 9/14. Plans for PEG placement in near future. 9/18 Plans for PEG have been cancelled for now as he has had ongoing issues with seizure activity since yesterday.   I met today at "Larry Banks's" bedside with wife [name] along with Dr. Hortense Ramal. Dr. Hortense Ramal spent some time explaining concern for ongoing seizures and that the next option to control seizures would be with burst suppression using versed and propofol. We discussed that this would likely require to be back on ventilator. Dr. Hortense Ramal was clear about her concerns for anti-seizure medications furthering any neurological improvement (as well as seizures themselves). We discussed that this is a step backwards and a huge step backwards if needing infusions and vent.   During this conversation we also readdressed [place] and concern about going backwards. She was very clear, very quickly that the goal is to keep him alive - whatever this means. She is open to all interventions to keep him alive. Unable to discuss if there is a limit to when they do not consider this living because she is also convinced that God will heal him and he will improve. For [name] there are no other options than he will eventually improve.   All questions/concerns addressed. Emotional support provided. Discussed above with Dr. Reesa Chew.   Exam: Unresponsive. Twitch in hand or feet occasionally.  No following commands, tracking, or even opening eyes today. Breathing regular, unlabored, trach collar. Abd soft.   Plan: - Restart tube feeding as surgical plans have been postponed - discussed with Dr. Reesa Chew.  - Continue full aggressive care per wife request.   [id number] 75 min  Vinie Sill, NP Palliative Medicine Team Pager [phone] (Please see [email or web address] for schedule) Team Phone [phone]    Greater than 50%  of this time was spent counseling and coordinating care related to the above assessment and plan

## 2019-08-21 NOTE — Procedures (Addendum)
Patient Name: Larry Banks  MRN: [id number]  Epilepsy Attending: Lora Havens  Referring Physician/Provider: Dr Zeb Comfort Duration: 08/12/2019 1331 to 08/12/2019 1332  Patient history: 48yo M with cardiac arrest. EEG to evaluate for seizure.  Level of alertness: obtunded  AEDs during EEG study: LEV  Technical aspects: This EEG study was done with scalp electrodes positioned according to the 10-20 International system of electrode placement. Electrical activity was acquired at a sampling rate of 500Hz  and reviewed with a high frequency filter of 70Hz  and a low frequency filter of 1Hz . EEG data were recorded continuously and digitally stored.   DESCRIPTION: EEG showed three seizures on 08/16/2019 at 1342, 1415 and 1145 during which patient was noted to have right gaze deviation and head turn to right followed by right sided jerking, lasted 3 minutes. Concomitant EEG showed significant myogenic artifact and no clear evolving seizure pattern was seen.  EEG was reactive to tactile stimulation. Hyperventilation and photic stimulation were not performed.  Event button was once pressed on 08/17/2019 at 1648 for unclear reason. Concomitant EEG didn't show any EEG change before, during and after the event.  ABNORMALITY 1. Seizure 2. Generalized suppression  IMPRESSION:  This study showed three seizures, lasting about 3 minutes as described above.  There is also severe to profound diffuse encephalopathy. No definite  epileptiform discharges were seen throughout the recording.  [name] [name]

## 2019-08-21 NOTE — Progress Notes (Signed)
Patient had 5 seizures within an hour. MD was notified. Verbal orders were to given 1mg  of Ativan. Will continue to monitor.

## 2019-08-21 NOTE — Progress Notes (Signed)
[facility]    NEPHROLOGY PROGRESS NOTE  SUBJECTIVE:  Continues to have seizures-- on routine EEG and now on video EEG.  HD yesterday, clotted system.    OBJECTIVE:  Vitals:   08/13/2019 0846 08/11/2019 0900  BP: 98/77   Pulse: (!) 110   Resp: (!) 22   Temp:  98.9 F (37.2 C)  SpO2: 100%     Intake/Output Summary (Last 24 hours) at 08/27/2019 1031 Last data filed at 08/25/2019 0600 Gross per 24 hour  Intake 497.7 ml  Output 1700 ml  Net -1202.3 ml      General: NAD, unresponsive HEENT: eyes closed, EEG leads in place Neck:  No JVD, trach in place CV:  Heart RRR Lungs:  Clear anteriorly Abd:  abd SNT/ND with normal BS, some distention but soft Extremities:  No LE edema. ACCESS: RLQ PD cath, R IJ TDC  MEDICATIONS:  . amLODipine  5 mg Per Tube Daily  . calcium acetate (Phos Binder)  1,334 mg Per Tube TID WC  . chlorhexidine gluconate (MEDLINE KIT)  15 mL Mouth Rinse BID  . Chlorhexidine Gluconate Cloth  6 each Topical Daily  . clonazePAM  1 mg Per Tube TID  . darbepoetin (ARANESP) injection - DIALYSIS  60 mcg Intravenous Q Tue-HD  . famotidine  10 mg Per Tube BID  . fentaNYL (SUBLIMAZE) injection  200 mcg Intravenous Once  . insulin aspart  0-20 Units Subcutaneous Q4H  . insulin aspart  3 Units Subcutaneous Q4H  . insulin glargine  30 Units Subcutaneous Daily  . mouth rinse  15 mL Mouth Rinse 10 times per day  . metoprolol tartrate  12.5 mg Per NG tube BID  . sodium chloride flush  3 mL Intravenous Q12H       LABS:   CBC Latest Ref Rng & Units 08/11/2019 08/18/2019 08/19/2019  WBC 4.0 - 10.5 K/uL 11.6(H) 11.4(H) 11.5(H)  Hemoglobin 13.0 - 17.0 g/dL 10.6(L) 10.1(L) 10.6(L)  Hematocrit 39.0 - 52.0 % 35.0(L) 32.4(L) 34.2(L)  Platelets 150 - 400 K/uL 401(H) 456(H) 481(H)    CMP Latest Ref Rng & Units 08/18/2019 08/28/2019 09/01/2019  Glucose 70 - 99 mg/dL 117(H) 120(H) 110(H)  BUN 6 - 20 mg/dL 91(H) 90(H) 179(H)  Creatinine 0.61 - 1.24 mg/dL 6.93(H)  6.85(H) 10.11(H)  Sodium 135 - 145 mmol/L 134(L) 135 135  Potassium 3.5 - 5.1 mmol/L 5.3(H) 5.3(H) 5.4(H)  Chloride 98 - 111 mmol/L 92(L) 93(L) 90(L)  CO2 22 - 32 mmol/L 21(L) 22 21(L)  Calcium 8.9 - 10.3 mg/dL 9.3 9.4 9.5  Total Protein 6.5 - 8.1 g/dL - - -  Total Bilirubin 0.3 - 1.2 mg/dL - - -  Alkaline Phos 38 - 126 U/L - - -  AST 15 - 41 U/L - - -  ALT 0 - 44 U/L - - -    Lab Results  Component Value Date   PTH 93 (H) 07/28/2019   CALCIUM 9.3 08/24/2019   CALCIUM 9.4 08/05/2019   CAION 1.00 (L) 08/03/2019   PHOS 8.2 (H) 08/25/2019       Component Value Date/Time   COLORURINE YELLOW 08/08/2019 0106   APPEARANCEUR CLEAR 08/08/2019 0106   LABSPEC 1.014 08/08/2019 0106   PHURINE 5.0 08/08/2019 0106   GLUCOSEU NEGATIVE 08/08/2019 0106   HGBUR MODERATE (A) 08/08/2019 0106   BILIRUBINUR NEGATIVE 08/08/2019 0106   KETONESUR NEGATIVE 08/08/2019 0106   PROTEINUR 100 (A) 08/08/2019 0106   NITRITE NEGATIVE 08/08/2019 0106   LEUKOCYTESUR NEGATIVE 08/08/2019  0106      Component Value Date/Time   PHART 7.424 08/03/2019 0651   PCO2ART 42.0 08/03/2019 0651   PO2ART 133.0 (H) 08/03/2019 0651   HCO3 27.5 08/03/2019 0651   TCO2 29 08/03/2019 0651   ACIDBASEDEF 1.3 08/03/2019 0755   O2SAT 99.0 08/03/2019 0651       Component Value Date/Time   IRON 8 (L) 07/23/2019 1336   TIBC 307 07/31/2019 1336   IRONPCTSAT 3 (L) 07/30/2019 1336       ASSESSMENT/PLAN:     1. ESRD THS Via R IJ TDC with [facility].  Azotemia noted, likely from catabolic state/ TF.  HD previously on MWF schedule, off schedule yesterday, will have next HD planned for tomorrow (Sat will be HD #3 for the week).  2. Seizures: on Keppra, had recurrence early this AM, resolved with Ativan, neuro reconsulted --> still having seizures, appreciate their assistance  3. Anoxic brain injury - continue scope of care with all aggressive interventions per family- EEG with essentially devastating injury, temporal lobe spikes, low  likelihood of recovery.  He is unlikely to achieve prior level of functioning and quality of life.  Support ethics c/s.   4. S/p PD cath placement 8/27: no longer a PD candidate given anoxic brain injury.  Was going to have PD cath removed.  Appreciate the assistance of gen surg--> on hold d/t recurrent seizures over the last 24 hours and clinical deterioration.  Agree with Dr. Pollie Friar note this AM, further procedures/ invasive interventions likely to achieve overall benefit.  Per notes, ethics being consulted.   5. VDRF - on TC this AM  6. Hyperphosphatemia on binders  7. Fevers/Leukocytsois, B Cx NGTD, s/p CEfepime/Vanc; fever curve has improved overall; noting continued elevated temps with Tmax yesterday 99.9.  8. Anemia of CKD.  Hgb stable.  Iron low, had angioedema to ferrlicet vs antibiotic.  Listed as allergy, d/w pharmacy. ON aranesp 60 q week

## 2019-08-21 NOTE — TOC Progression Note (Signed)
Transition of Care [facility]) - Progression Note    Patient Details  Name: Larry Banks MRN: [id number] Date of Birth: July 18, 1971  [facility] of [facility]) CM/SW Contact  Graves-Bigelow, Ocie Cornfield, RN Phone Number: 08/31/2019, 10:58 AM  Clinical Narrative:   CM following patient for transition of care needs. [place] has been following the patient daily. [facility] does not have a HD bed available at this time. CM will continue to monitor.    Expected Discharge Plan: Long Term Acute Care (LTAC) Barriers to Discharge: Continued Medical Work up  Expected Discharge Plan and Services Expected Discharge Plan: Long Term Acute Care (LTAC)   Discharge Planning Services: CM Consult Post Acute Care Choice: Dialysis(Dialysis outpatient- new prior to admission.) Living arrangements for the past 2 months: Single Family Home                     Social Determinants of Health (SDOH) Interventions    Readmission Risk Interventions Readmission Risk Prevention Plan 08/05/2019  Transportation Screening Complete  HRI or Home Care Consult Complete  Medication Review (RN Care Manager) Complete  Some recent data might be hidden

## 2019-08-21 NOTE — Progress Notes (Signed)
Reason for consult: seizure  Subjective: Patient had more seizures yesterday and was loaded with VPA. Wifr at bedside today.    ROS: Unable to obtain due to poor mental status  Examination  Vital signs in last 24 hours: Temp:  [98.2 F (36.8 C)-99.3 F (37.4 C)] 99.1 F (37.3 C) (09/18 1122) Pulse Rate:  [38-113] 96 (09/18 1153) Resp:  [19-32] 22 (09/18 1153) BP: (82-112)/(58-88) 86/71 (09/18 1153) SpO2:  [91 %-100 %] 100 % (09/18 1153) FiO2 (%):  [28 %] 28 % (09/18 1153) Weight:  [78.2 kg-83.7 kg] 78.2 kg (09/18 0500)  General: lying in bed, NAD CVS: pulse-normal rate and rhythm RS: breathing comfortably, +Trach Extremities: normal, warm  Neuro: MS: Does Not open eyes to noxious stimuli, does not track, does not follow commands CN: pupils equal and reactive, oculocephalic reflex intact, corneal reflex intact, no apparent facial asymmetry, unable to assess rest of the cranial nerves due to encephalopathy  Motor: withdraws to noxious stimuli in all extremities reflexes: 1+ bilaterally over patella, biceps Coordination: Unable to assess secondary to encephalopathy Gait: not tested  Basic Metabolic Panel: Recent Labs  Lab 08/15/19 0339  08/17/19 0743 08/18/19 0256 08/18/19 1208 08/19/19 0220 08/11/2019 0537 09/02/2019 0219  NA 139   < > 136 137  --  136  136 135 134*  135  K 4.2   < > 5.3* 4.8  --  4.4  4.4 5.4* 5.3*  5.3*  CL 99   < > 92* 92*  --  92*  92* 90* 92*  93*  CO2 20*   < > 19* 19*  --  22  21* 21* 21*  22  GLUCOSE 187*   < > 208* 180*  --  191*  191* 110* 117*  120*  BUN 128*   < > 157* 201* 60* 109*  104* 179* 91*  90*  CREATININE 8.77*   < > 10.03* 11.54*  --  6.79*  6.70* 10.11* 6.93*  6.85*  CALCIUM 9.8   < > 9.6 9.4  --  9.7  9.6 9.5 9.3  9.4  MG 2.9*  --   --   --   --  2.8*  --   --   PHOS 7.1*  --  9.2* 10.8*  --  6.3* 9.6* 8.2*   < > = values in this interval not displayed.    CBC: Recent Labs  Lab 08/17/19 0743 08/18/19 0256  08/19/19 0220 08/29/2019 0239 08/21/19 0219  WBC 14.7* 13.8* 11.5* 11.4* 11.6*  HGB 9.8* 9.7* 10.6* 10.1* 10.6*  HCT 31.8* 30.0* 34.2* 32.4* 35.0*  MCV 90.6 89.3 90.2 90.0 91.1  PLT 467* 458* 481* 456* 401*     Imaging  MRI brain without contrast 08/06/2019:  Diffuse hypoxic ischemic injury most pronounced affecting the cerebral hemispheric cortex but with some involvement also of the basal ganglia and thalami.   ASSESSMENT AND PLAN: 48 year old male status post cardiac arrest and tracheostomy now with seizure-like activity.   Cardiac arrest Anoxic brain injury Status post trach Seizures Encephalopathy secondary to diffuse anoxic brain injury - patient had more seizures overnight and was loaded with VPA. Had another seizure this afternoon.  Recommendations -Continue LTM to assess for seizures - Contiue LEV 500mg  BID - Continue clonazepam 1mg  tid, can be increased to 2mg  TID if seizure recurs - Will increase VPA to 500-750-500mg  and check level tomorrow am - had a lengthy discussion with wife while palliative team was present about goals of care  of patient has status epilepticus. Wife states she would like for [place] to maximize his current AEds and then if needed to proceed with versed/propofol. We also discussed that if he needs versed/propofol to control his seizures, he may need to go back on vent and this will most likely worsen his neurologic recovery. She states he would have wanted to live and she wants to give him the best chance. She will also discuss this with the rest of family members and let us know  -We will also start clonazepam 1 mg 3 times a day -Seizure precautions PRN IV Ativan 2 mg only to be given for generalized tonic-clonic seizure lasting more than 3 minutes.  Please let neurology team know this is administered    Thank you for allowing [place] to participate in the care of this patient.  Please page the neuro hospitalist for any further questions.   I  have spent a total of45  minuteswith the patient reviewing hospitalnotes,  test results, labs and examining the patient as well as establishing an assessment and plan that was discussed personally with the patient's wife.>50% of time was spent in direct patient care.

## 2019-08-22 ENCOUNTER — Inpatient Hospital Stay (HOSPITAL_COMMUNITY): Payer: 59

## 2019-08-22 LAB — BASIC METABOLIC PANEL
Anion gap: 30 — ABNORMAL HIGH (ref 5–15)
BUN: 148 mg/dL — ABNORMAL HIGH (ref 6–20)
CO2: 15 mmol/L — ABNORMAL LOW (ref 22–32)
Calcium: 9.4 mg/dL (ref 8.9–10.3)
Chloride: 89 mmol/L — ABNORMAL LOW (ref 98–111)
Creatinine, Ser: 9.74 mg/dL — ABNORMAL HIGH (ref 0.61–1.24)
GFR calc Af Amer: 7 mL/min — ABNORMAL LOW (ref >60)
GFR calc non Af Amer: 6 mL/min — ABNORMAL LOW (ref >60)
Glucose, Bld: 74 mg/dL (ref 70–99)
Potassium: 6.2 mmol/L — ABNORMAL HIGH (ref 3.5–5.1)
Sodium: 134 mmol/L — ABNORMAL LOW (ref 135–145)

## 2019-08-22 LAB — POCT I-STAT 7, (LYTES, BLD GAS, ICA,H+H)
Acid-base deficit: 10 mmol/L — ABNORMAL HIGH (ref 0.0–2.0)
Bicarbonate: 16.6 mmol/L — ABNORMAL LOW (ref 20.0–28.0)
Calcium, Ion: 1.02 mmol/L — ABNORMAL LOW (ref 1.15–1.40)
HCT: 30 % — ABNORMAL LOW (ref 39.0–52.0)
Hemoglobin: 10.2 g/dL — ABNORMAL LOW (ref 13.0–17.0)
O2 Saturation: 100 %
Patient temperature: 99
Potassium: 7.4 mmol/L (ref 3.5–5.1)
Sodium: 128 mmol/L — ABNORMAL LOW (ref 135–145)
TCO2: 18 mmol/L — ABNORMAL LOW (ref 22–32)
pCO2 arterial: 38 mmHg (ref 32.0–48.0)
pH, Arterial: 7.249 — ABNORMAL LOW (ref 7.350–7.450)
pO2, Arterial: 303 mmHg — ABNORMAL HIGH (ref 83.0–108.0)

## 2019-08-22 LAB — GLUCOSE, CAPILLARY
Glucose-Capillary: 73 mg/dL (ref 70–99)
Glucose-Capillary: 88 mg/dL (ref 70–99)
Glucose-Capillary: 76 mg/dL (ref 70–99)
Glucose-Capillary: 79 mg/dL (ref 70–99)
Glucose-Capillary: 71 mg/dL (ref 70–99)
Glucose-Capillary: 74 mg/dL (ref 70–99)

## 2019-08-22 LAB — VALPROIC ACID LEVEL: Valproic Acid Lvl: 70 ug/mL (ref 50.0–100.0)

## 2019-08-22 MED ORDER — CLONAZEPAM 0.5 MG PO TBDP
2.0000 mg | ORAL_TABLET | Freq: Three times a day (TID) | ORAL | Status: DC
  Administered 2019-08-22 – 2019-08-24 (×7): 2 mg
  Filled 2019-08-22 (×4): qty 4
  Filled 2019-08-22: qty 8
  Filled 2019-08-22 (×2): qty 4

## 2019-08-22 MED ORDER — CHLORHEXIDINE GLUCONATE 0.12% ORAL RINSE (MEDLINE KIT)
15.0000 mL | Freq: Two times a day (BID) | OROMUCOSAL | Status: DC
  Administered 2019-08-23 – 2019-08-26 (×7): 15 mL via OROMUCOSAL

## 2019-08-22 MED ORDER — LORAZEPAM 2 MG/ML IJ SOLN
2.0000 mg | Freq: Once | INTRAMUSCULAR | Status: AC
  Administered 2019-08-22: 2 mg via INTRAVENOUS
  Filled 2019-08-22: qty 1

## 2019-08-22 MED ORDER — CLONAZEPAM 1 MG PO TABS: 2.0000 mg | ORAL_TABLET | Freq: Three times a day (TID) | ORAL | Status: DC

## 2019-08-22 MED ORDER — VALPROATE SODIUM 500 MG/5ML IV SOLN
750.0000 mg | Freq: Three times a day (TID) | INTRAVENOUS | Status: DC
  Administered 2019-08-23 – 2019-08-24 (×5): 750 mg via INTRAVENOUS
  Filled 2019-08-22 (×9): qty 7.5

## 2019-08-22 MED ORDER — LEVETIRACETAM IN NACL 1000 MG/100ML IV SOLN
1000.0000 mg | Freq: Two times a day (BID) | INTRAVENOUS | Status: DC
  Administered 2019-08-23 – 2019-08-26 (×7): 1000 mg via INTRAVENOUS
  Filled 2019-08-22 (×7): qty 100

## 2019-08-22 MED ORDER — ORAL CARE MOUTH RINSE
15.0000 mL | OROMUCOSAL | Status: DC
  Administered 2019-08-23 – 2019-08-26 (×36): 15 mL via OROMUCOSAL

## 2019-08-22 NOTE — Progress Notes (Signed)
MD at bedside for bronch.

## 2019-08-22 NOTE — Progress Notes (Addendum)
[name] Brief Interval Note   48 yo man s/p cardiac arrest with ROSC, s/p TTM, with seizures. Widespread hypoxic ischemic injury evident on MRI Brain 08/06/19.  Patient transferred from ICU to floor 08/22/19. Patient with ongoing seizure on floor 9/19 evening, with associated desaturation. Patient being transferred back to ICU.   P -CXR -ABG  -Pulm hygiene -Neurology aware and are increasing AEDs -Oxygen support for SpO2 > 90%, patient s/p tracheostomy, can support with vent if needed based on ABG/Sats  -Per neurology notes, no burst suppression   ________________________________________  [name] Brief Addendum  Hypoxia P -CXR with L sided whiteout, suspect mucus plugging as etiology for hypoxia  -Attempts made to reach wife regarding consent for bedside bronchoscopy, unable to reach -Previous care notes reviewed including palliative care note which expresses family's desire for all medical interventions. [name] will proceed with bedside bronchoscopy for suspected mucus plugging, using slim or pediatric scope given  trach size   Over 35 minutes spent in chart review, bedside evaluation and critical care planning and discussion with NP Bowser.  Will ventilate and provide pulmonary toilet over night, will be reevaluated in am for discontinuing ventilatory support dependent on status of seizure activity.  Patients prognosis is extremely poor.   Eliseo Gum MSN, AGACNP-BC [place] [phone] If no answer, [phone] 08/22/2019, 10:36 PM

## 2019-08-22 NOTE — Significant Event (Addendum)
Rapid Response Event Note  Overview: Asked to see pt during RR rounds with concerns of seizure. Pt had arrived from [facility] about 30 minutes prior to my assessment. Pt here with cardiac arrest/s/p cooling/ now with anoxic with frequent seizures. Time Called: 2115(During rounds) Arrival Time: 2115 Event Type: Neurologic  Initial Focused Assessment: Pt laying in bed with eyes/eyesbrows, mouth and upper extremities twitching(this had been occurring since transfer per bedside RN). Pt unresponsive with downward gaze, pupils 3 and sluggish, breathing shallow, lung sounds very diminished with very little air mvmt. T-99.1, HR-118, BP-159/98, RR-31, SpO2-99% on .28 TC. Not sure if twitching is myoclonus or a seizure, however, description of seizure in MD note matching current pt activity. 2mg  ativan given at 2133 with little change in seizure-like twitching. Dr. Leonel Ramsay called and ordered additional 2mg  ativan to be given. This was given at 2153 with little change as well. After 2nd dose of ativan, pt's SpO2 began to fluctuate between 60% and back to 98%. .28 TC increased to .98 TC however SpO2 still fluctuating. BP-83/64 post ativan. Bodenheimer to beside and [name] notified. Will move back to 2H.  Interventions: 2mg  ativan X 2 .28 TC >.98 TC Dr. Leonel Ramsay, Silas Sacramento, NP to bedside 500cc NS bolus started and pt transferred to [place] (if not transferred): Transfer to [place] Event Summary: Name of Physician Notified: Silas Sacramento, NP at 2208  Name of Consulting Physician Notified: Dr. Leonel Ramsay and Dr. Jimmy Footman at [id number] to [facility])  Outcome: Transferred (Comment)(2H02)  Event End Time: 2250  Dillard [place]

## 2019-08-22 NOTE — Plan of Care (Signed)
  Problem: Clinical Measurements: Goal: Ability to maintain clinical measurements within normal limits will improve Outcome: Progressing Goal: Will remain free from infection Outcome: Progressing Goal: Respiratory complications will improve Outcome: Progressing Goal: Cardiovascular complication will be avoided Outcome: Progressing   Problem: Activity: Goal: Risk for activity intolerance will decrease Outcome: Not Progressing   Problem: Elimination: Goal: Will not experience complications related to bowel motility Outcome: Progressing   Problem: Pain Managment: Goal: General experience of comfort will improve Outcome: Progressing   Problem: Skin Integrity: Goal: Risk for impaired skin integrity will decrease Outcome: Progressing   Problem: Respiratory: Goal: Ability to maintain a clear airway and adequate ventilation will improve Outcome: Progressing   Problem: Neurologic: Goal: Promote progressive neurologic recovery Outcome: Not Progressing

## 2019-08-22 NOTE — Progress Notes (Signed)
Reason for consult:   Subjective:   ROS: Unable to obtain due to poor mental status  Examination  Vital signs in last 24 hours: Temp:  [98.2 F (36.8 C)-99.1 F (37.3 C)] 98.2 F (36.8 C) (09/19 0400) Pulse Rate:  [94-110] 101 (09/19 0730) Resp:  [18-35] 22 (09/19 0730) BP: (75-112)/(62-89) 98/85 (09/19 0730) SpO2:  [95 %-100 %] 100 % (09/19 0730) FiO2 (%):  [28 %-40 %] 40 % (09/19 0730) Weight:  [76.9 kg] 76.9 kg (09/19 0500)  General: lying in bed CVS: pulse-normal rate and rhythm RS: breathing comfortably Extremities: normal   Neuro: Mental Status: Patient's eyes are open to noxious stimulus.  Does not follow any commands Cranial Nerves: II: Pupils are equal and reactive III,IV,VI: doll's response present V,VII: corneal reflex: Present Motor: Extremities flaccid throughout.  No spontaneous movement noted.  No purposeful movements noted Sensory: Does not respond to noxious stimuli in any extremity. Plantars: Mute Cerebellar: Unable to perform  Basic Metabolic Panel: Recent Labs  Lab 08/17/19 0743 08/18/19 0256 08/18/19 1208 08/19/19 0220 08/04/2019 0537 08/04/2019 0219 08/22/19 0219  NA 136 137  --  136  136 135 134*  135 134*  K 5.3* 4.8  --  4.4  4.4 5.4* 5.3*  5.3* 6.2*  CL 92* 92*  --  92*  92* 90* 92*  93* 89*  CO2 19* 19*  --  22  21* 21* 21*  22 15*  GLUCOSE 208* 180*  --  191*  191* 110* 117*  120* 74  BUN 157* 201* 60* 109*  104* 179* 91*  90* 148*  CREATININE 10.03* 11.54*  --  6.79*  6.70* 10.11* 6.93*  6.85* 9.74*  CALCIUM 9.6 9.4  --  9.7  9.6 9.5 9.3  9.4 9.4  MG  --   --   --  2.8*  --   --   --   PHOS 9.2* 10.8*  --  6.3* 9.6* 8.2*  --     CBC: Recent Labs  Lab 08/17/19 0743 08/18/19 0256 08/19/19 0220 08/23/2019 0239 08/19/2019 0219  WBC 14.7* 13.8* 11.5* 11.4* 11.6*  HGB 9.8* 9.7* 10.6* 10.1* 10.6*  HCT 31.8* 30.0* 34.2* 32.4* 35.0*  MCV 90.6 89.3 90.2 90.0 91.1  PLT 467* 458* 481* 456* 401*     Coagulation  Studies: No results for input(s): LABPROT, INR in the last 72 hours.  Imaging Reviewed: MRI brain shows severe anoxic brain damage  EEG: Discussed with Dr. Hortense Ramal, reviewed EEG myself.  No obvious epileptiform discharges during clinical event.  ASSESSMENT AND PLAN  Severe anoxic brain injury Recurrent clinical seizure activity with no clear EEG correlate: (possibly representing subcortical seizures )  Recommendations - increased VPA to 750 mg TID - Increased Klonapin to 2mg  TID - Increase Leviteracetam to 1g BID - Had conversation with wife, discussed about burst suppression since we are not able to control his seizure activity with 3 agents, it is less likely we can be able to achieve control with additional agents. Will maximize current medications. Discussed again about burst suppression and while that may help in controlling seizures, that in my opinion, pursuing burst suppression will unlikely improve neurological outcome.     After discussion, patient's wife stated she would not want to pursue burst suppression, atleast at this time.   This patient is neurologically critically ill due to severe anoxic brain injury and recurrent seizure-like activity.  Care of this patient includes reviewing imaging, chart review, reviewing and discussing EEG, reviewing  vital signs and lab work discussion with family, complex decision making.  I spent 35 minutes of critical care time with this patient.  Karena Addison Tahjai Schetter [facility] Pager Number [phone] For questions after 7pm please refer to AMION to reach the Neurologist on call

## 2019-08-22 NOTE — Progress Notes (Addendum)
PROGRESS NOTE    Larry Banks  OIZ:[id number] DOB: 25-Oct-1971 DOA: 07/31/2019 PCP: Curlene Labrum, MD   Brief Narrative:  48 year old with history of essential hypertension, peripheral arterial disease, hyperlipidemia, CKD stage V developed shortness of breath at home came to any pain hospital.  Usually follows with [facility] and recently was transitioned to peritoneal dialysis.  Upon arrival to the ER he went into cardiac arrest with asystole requiring CPR.  Transferred to ICU after ROSC 15 minutes later.  Initially required pressors.  HD catheter placed 8/26.  CT of the head was negative.  CT of the abdomen pelvis initially showed bilateral lower lobes pleural effusions nonobstructive renal stone.  Echocardiogram showed ejection fraction 45-50% with hypokinesis.  EEG showed profound slowing of brain activity but no seizure.  CT of the head repeated on 9/1 showed severe diffuse brain edema with widespread hypoxic injury.  Repeat EEG 9/2 showed profound diffuse encephalopathy secondary to hypoxic brain injury.  MRI brain 9/3 showed hypoxic diffuse brain injury.  Most of the cultures remain negative except sputum culture showed above-noted gram-negative rods.  Initially has received Unasyn, cefepime and vancomycin but currently off antibiotics.  Intubated from 8/29 to 9/10.  Having recurrent seizures   Assessment & Plan:   Active Problems:   Cardiac arrest ([place])   Encounter for central line placement   Acute respiratory failure (HCC)   Ventilator dependence ([place])   ESRD needing dialysis ([place])   Anoxic brain damage (HCC)   Seizures (HCC)   PEA cardiac arrest Encephalopathy secondary to anoxic brain injury Recurrent focal seizures, increasing frequency -CT of the head/MRI and EEG are consistent with anoxic brain injury.  Neurology team following. -Supportive care. -Status post tracheostomy.  Standard care only -Echocardiogram showed ejection fraction 45-50% with  hypokinesis - Continue Keppra and clonazepam.  Ongoing EEG.   Moderate protein calorie malnutrition Dysphagia due to encephalopathy -Plans for PEG tube next week once seizures are better  ESRD on hemodialysis -Nephrology following.  Ongoing dialysis  Anemia of chronic disease/iron deficiency -Apparently developed angioedema reaction secondary to iron infusion.  We will hold off on this.  Monitor hemoglobin  Diabetes mellitus type 2, insulin-dependent, relatively stable -Resume home meds.  Insulin sliding scale Accu-Chek -Lantus 30 units daily -NovoLog 3 units every 4 hours  Essential hypertension with sinus tachycardia -On amlodipine and metoprolol.  I spoke with the patient's wife yesterday over the phone.  We plan on meeting in person today when she arrives so we can have further discussion.  DVT prophylaxis: Subcu heparin Code Status: Full code Family Communication: Plan on speaking with wife when she arrives at bedside today. disposition Plan: To be determined   UPDATE 245PM Met with the wife and son in the conference room. Explained them his very poor prognosis and current medical condition. They understand but are very hopeful about his recovery and would like to give him a fighting chance as much as possible. They are not ready to give up on him yet. Would want to pursue aggressive measures to control his seizure, get Peg tube in place and find placement at [facility]. In the meantime continue dialysis.   Consultants:   Nephrology  General surgery  [name]  Antimicrobials:  Unasyn 8/29 >9/1 Cefepime 9/5> 9/11 vanc 9/5 > 9/9   Subjective: When I walked in the room patient was having seizure with twitching of his eyes, eyebrows and upper extremity.  Spoke with the nursing staff at bedside, tells me he  has had at least 20 episodes this morning each lasting probably less than 2 minutes. During these times he also desaturates  Review of Systems Otherwise negative except as  per HPI, including: Unable to obtain Objective: Vitals:   08/22/19 0730 08/22/19 0752 08/22/19 0800 08/22/19 1000  BP: 98/85  94/74 (!) 87/67  Pulse: (!) 101   82  Resp: (!) 22  20 (!) 23  Temp:  98.5 F (36.9 C)    TempSrc:  Oral    SpO2: 100%  100% 100%  Weight:      Height:        Intake/Output Summary (Last 24 hours) at 08/22/2019 1129 Last data filed at 08/22/2019 0900 Gross per 24 hour  Intake 467.48 ml  Output -  Net 467.48 ml   Filed Weights   08/24/2019 1413 09/01/2019 0500 08/22/19 0500  Weight: 83.7 kg 78.2 kg 76.9 kg    Examination:  General exam: Remains unresponsive, was having active focal seizures Respiratory system: Anterior rhonchorous breath sounds.  Tracheostomy in place Cardiovascular system: Tachycardia Gastrointestinal system: Has bowel sounds.  PD catheter in place Central nervous system: Having focal seizures but difficult to assess overall. Extremities: No contractures Skin: No rashes, lesions or ulcers Psychiatry: Unable to assess  Condom catheter in place Tracheostomy Right chest wall hemodialysis catheter   Data Reviewed:   CBC: Recent Labs  Lab 08/17/19 0743 08/18/19 0256 08/19/19 0220 08/14/2019 0239 08/15/2019 0219  WBC 14.7* 13.8* 11.5* 11.4* 11.6*  HGB 9.8* 9.7* 10.6* 10.1* 10.6*  HCT 31.8* 30.0* 34.2* 32.4* 35.0*  MCV 90.6 89.3 90.2 90.0 91.1  PLT 467* 458* 481* 456* 818*   Basic Metabolic Panel: Recent Labs  Lab 08/17/19 0743 08/18/19 0256 08/18/19 1208 08/19/19 0220 08/28/2019 0537 08/19/2019 0219 08/22/19 0219  NA 136 137  --  136  136 135 134*  135 134*  K 5.3* 4.8  --  4.4  4.4 5.4* 5.3*  5.3* 6.2*  CL 92* 92*  --  92*  92* 90* 92*  93* 89*  CO2 19* 19*  --  22  21* 21* 21*  22 15*  GLUCOSE 208* 180*  --  191*  191* 110* 117*  120* 74  BUN 157* 201* 60* 109*  104* 179* 91*  90* 148*  CREATININE 10.03* 11.54*  --  6.79*  6.70* 10.11* 6.93*  6.85* 9.74*  CALCIUM 9.6 9.4  --  9.7  9.6 9.5 9.3  9.4 9.4   MG  --   --   --  2.8*  --   --   --   PHOS 9.2* 10.8*  --  6.3* 9.6* 8.2*  --    GFR: Estimated Creatinine Clearance: 9 mL/min (A) (by C-G formula based on SCr of 9.74 mg/dL (H)). Liver Function Tests: Recent Labs  Lab 08/17/19 0743 08/18/19 0256 08/19/19 0220 08/11/2019 0537 08/22/2019 0219  ALBUMIN 2.9* 2.8* 3.2* 3.1* 3.1*   No results for input(s): LIPASE, AMYLASE in the last 168 hours. No results for input(s): AMMONIA in the last 168 hours. Coagulation Profile: No results for input(s): INR, PROTIME in the last 168 hours. Cardiac Enzymes: No results for input(s): CKTOTAL, CKMB, CKMBINDEX, TROPONINI in the last 168 hours. BNP (last 3 results) No results for input(s): PROBNP in the last 8760 hours. HbA1C: No results for input(s): HGBA1C in the last 72 hours. CBG: Recent Labs  Lab 08/12/2019 1525 08/22/2019 1927 08/22/2019 2330 08/22/19 0350 08/22/19 0751  GLUCAP 85 74 86 73 88  Lipid Profile: No results for input(s): CHOL, HDL, LDLCALC, TRIG, CHOLHDL, LDLDIRECT in the last 72 hours. Thyroid Function Tests: No results for input(s): TSH, T4TOTAL, FREET4, T3FREE, THYROIDAB in the last 72 hours. Anemia Panel: No results for input(s): VITAMINB12, FOLATE, FERRITIN, TIBC, IRON, RETICCTPCT in the last 72 hours. Sepsis Labs: Recent Labs  Lab 08/17/19 0751  PROCALCITON 7.29    Recent Results (from the past 240 hour(s))  Expectorated sputum assessment w rflx to resp cult     Status: None   Collection Time: 08/16/19  3:29 PM   Specimen: Tracheal Aspirate; Sputum  Result Value Ref Range Status   Specimen Description TRACHEAL ASPIRATE  Final   Special Requests NONE  Final   Sputum evaluation   Final    THIS SPECIMEN IS ACCEPTABLE FOR SPUTUM CULTURE Performed at [facility], [address], [place], [place] [address]    Report Status 08/16/2019 FINAL  Final  Culture, respiratory     Status: None   Collection Time: 08/16/19  3:29 PM   Specimen: Tracheal Aspirate   Result Value Ref Range Status   Specimen Description TRACHEAL ASPIRATE  Final   Special Requests NONE Reflexed from [id number]  Final   Gram Stain   Final    ABUNDANT SQUAMOUS EPITHELIAL CELLS PRESENT ABUNDANT WBC PRESENT, PREDOMINANTLY PMN ABUNDANT GRAM NEGATIVE RODS RARE GRAM POSITIVE COCCI    Culture   Final    FEW Consistent with normal respiratory flora. Performed at [facility], [place] [address], [place], [place] [address]    Report Status 08/19/2019 FINAL  Final  Culture, blood (routine x 2)     Status: None   Collection Time: 08/16/19  4:28 PM   Specimen: BLOOD  Result Value Ref Range Status   Specimen Description BLOOD LEFT ANTECUBITAL  Final   Special Requests AEROBIC BOTTLE ONLY Blood Culture adequate volume  Final   Culture   Final    NO GROWTH 5 DAYS Performed at [facility] [address], [place], [place] [address]    Report Status 08/07/2019 FINAL  Final  Culture, blood (routine x 2)     Status: None   Collection Time: 08/16/19  4:39 PM   Specimen: BLOOD  Result Value Ref Range Status   Specimen Description BLOOD BLOOD RIGHT HAND  Final   Special Requests AEROBIC BOTTLE ONLY Blood Culture adequate volume  Final   Culture   Final    NO GROWTH 5 DAYS Performed at [facility], [place] [address], [place], [place] [address]    Report Status 08/27/2019 FINAL  Final         Radiology Studies: No results found.      Scheduled Meds: . amLODipine  5 mg Per Tube Daily  . calcium acetate (Phos Binder)  1,334 mg Per Tube TID WC  . chlorhexidine gluconate (MEDLINE KIT)  15 mL Mouth Rinse BID  . Chlorhexidine Gluconate Cloth  6 each Topical Daily  . clonazepam  2 mg Per Tube TID  . darbepoetin (ARANESP) injection - DIALYSIS  60 mcg Intravenous Q Tue-HD  . famotidine  10 mg Per Tube BID  . fentaNYL (SUBLIMAZE) injection  200 mcg Intravenous Once  . insulin aspart  0-20 Units Subcutaneous Q4H  . insulin aspart  3 Units Subcutaneous Q4H  .  insulin glargine  30 Units Subcutaneous Daily  . mouth rinse  15 mL Mouth Rinse 10 times per day  . metoprolol tartrate  12.5  mg Per NG tube BID  . sodium chloride flush  3 mL Intravenous Q12H   Continuous Infusions: . sodium chloride 250 mL (08/16/19 1736)  . sodium chloride    . sodium chloride    . sodium chloride    . sodium chloride    . sodium chloride 10 mL/hr at 08/22/19 0700  . sodium chloride    . sodium chloride    . levETIRAcetam Stopped (08/22/19 0524)  . valproate sodium 750 mg (08/19/2019 1400)     LOS: 21 days   Time spent= 35 mins    Tanmay Halteman Arsenio Loader, MD [facility]  If 7PM-7AM, please contact night-coverage [email or web address] 08/22/2019, 11:29 AM

## 2019-08-22 NOTE — Progress Notes (Signed)
[facility]    NEPHROLOGY PROGRESS NOTE  SUBJECTIVE:  For HD today.  Neurology and pall care notes reviewed and appreciated.  OBJECTIVE:  Vitals:   08/22/19 0730 08/22/19 0752  BP: 98/85   Pulse: (!) 101   Resp: (!) 22   Temp:  98.5 F (36.9 C)  SpO2: 100%     Intake/Output Summary (Last 24 hours) at 08/22/2019 6606 Last data filed at 08/22/2019 2200 Gross per 24 hour  Intake 377.48 ml  Output -  Net 377.48 ml      General: NAD, unresponsive HEENT: eyes closed, EEG leads in place Neck:  No JVD, trach in place CV:  Heart RRR Lungs:  Clear anteriorly Abd:  abd SNT/ND with normal BS, some distention but soft Extremities:  Trace LE edema ACCESS: RLQ PD cath, R IJ TDC  MEDICATIONS:  . amLODipine  5 mg Per Tube Daily  . calcium acetate (Phos Binder)  1,334 mg Per Tube TID WC  . chlorhexidine gluconate (MEDLINE KIT)  15 mL Mouth Rinse BID  . Chlorhexidine Gluconate Cloth  6 each Topical Daily  . clonazePAM  2 mg Per Tube TID  . darbepoetin (ARANESP) injection - DIALYSIS  60 mcg Intravenous Q Tue-HD  . famotidine  10 mg Per Tube BID  . fentaNYL (SUBLIMAZE) injection  200 mcg Intravenous Once  . insulin aspart  0-20 Units Subcutaneous Q4H  . insulin aspart  3 Units Subcutaneous Q4H  . insulin glargine  30 Units Subcutaneous Daily  . mouth rinse  15 mL Mouth Rinse 10 times per day  . metoprolol tartrate  12.5 mg Per NG tube BID  . sodium chloride flush  3 mL Intravenous Q12H       LABS:   CBC Latest Ref Rng & Units 08/05/2019 08/10/2019 08/19/2019  WBC 4.0 - 10.5 K/uL 11.6(H) 11.4(H) 11.5(H)  Hemoglobin 13.0 - 17.0 g/dL 10.6(L) 10.1(L) 10.6(L)  Hematocrit 39.0 - 52.0 % 35.0(L) 32.4(L) 34.2(L)  Platelets 150 - 400 K/uL 401(H) 456(H) 481(H)    CMP Latest Ref Rng & Units 08/22/2019 08/29/2019 08/24/2019  Glucose 70 - 99 mg/dL 74 117(H) 120(H)  BUN 6 - 20 mg/dL 148(H) 91(H) 90(H)  Creatinine 0.61 - 1.24 mg/dL 9.74(H) 6.93(H) 6.85(H)  Sodium 135 - 145  mmol/L 134(L) 134(L) 135  Potassium 3.5 - 5.1 mmol/L 6.2(H) 5.3(H) 5.3(H)  Chloride 98 - 111 mmol/L 89(L) 92(L) 93(L)  CO2 22 - 32 mmol/L 15(L) 21(L) 22  Calcium 8.9 - 10.3 mg/dL 9.4 9.3 9.4  Total Protein 6.5 - 8.1 g/dL - - -  Total Bilirubin 0.3 - 1.2 mg/dL - - -  Alkaline Phos 38 - 126 U/L - - -  AST 15 - 41 U/L - - -  ALT 0 - 44 U/L - - -    Lab Results  Component Value Date   PTH 93 (H) 07/14/2019   CALCIUM 9.4 08/22/2019   CAION 1.00 (L) 08/03/2019   PHOS 8.2 (H) 08/11/2019       Component Value Date/Time   COLORURINE YELLOW 08/08/2019 0106   APPEARANCEUR CLEAR 08/08/2019 0106   LABSPEC 1.014 08/08/2019 0106   PHURINE 5.0 08/08/2019 0106   GLUCOSEU NEGATIVE 08/08/2019 0106   HGBUR MODERATE (A) 08/08/2019 0106   BILIRUBINUR NEGATIVE 08/08/2019 0106   KETONESUR NEGATIVE 08/08/2019 0106   PROTEINUR 100 (A) 08/08/2019 0106   NITRITE NEGATIVE 08/08/2019 0106   LEUKOCYTESUR NEGATIVE 08/08/2019 0106      Component Value Date/Time   PHART 7.424  08/03/2019 0651   PCO2ART 42.0 08/03/2019 0651   PO2ART 133.0 (H) 08/03/2019 0651   HCO3 27.5 08/03/2019 0651   TCO2 29 08/03/2019 0651   ACIDBASEDEF 1.3 07/19/2019 0755   O2SAT 99.0 08/03/2019 0651       Component Value Date/Time   IRON 8 (L) 08/03/2019 1336   TIBC 307 07/31/2019 1336   IRONPCTSAT 3 (L) 07/30/2019 1336       ASSESSMENT/PLAN:     1. ESRD THS Via R IJ TDC with [facility].  Azotemia noted, likely from catabolic state/ TF.  HD previously on MWF converted to TTS sched (Sat will be HD #3 for the week).  2. Seizures: on Keppra, had recurrence early this AM, resolved with Ativan, neuro reconsulted --> still having seizures, appreciate their assistance--> may require burst suppression per neurology notes.  3. Anoxic brain injury - continue scope of care with all aggressive interventions per family- EEG with essentially devastating injury, temporal lobe spikes, low likelihood of recovery.  He is unlikely to achieve prior  level of functioning and quality of life.  Support ethics c/s if deemed appropriate   4. S/p PD cath placement 8/27: no longer a PD candidate given anoxic brain injury.  Was going to have PD cath removed.  Appreciate the assistance of gen surg--> on hold d/t recurrent seizures over the last 24 hours and clinical deterioration.  Agree with Dr. Pollie Friar note, further procedures/ invasive interventions likely to achieve overall benefit.  Per notes, ethics being consulted.   5. VDRF - on TC this AM  6. Hyperphosphatemia on binders  7. Fevers/Leukocytsois, B Cx NGTD, s/p CEfepime/Vanc; fever curve has improved overall; noting continued elevated temps with Tmax yesterday 99.9.  8. Anemia of CKD.  Hgb stable.  Iron low, had angioedema to ferrlicet vs antibiotic.  Listed as allergy, d/w pharmacy. ON aranesp 60 q week  9. Dispo: he has a devastating neurologic injury with poor prognosis and slim hope of functional recovery.  Dialysis is not adding to his QOL and he remains a suboptimal candidate for short or long term dialysis.

## 2019-08-22 NOTE — Procedures (Signed)
Bronchoscopy Procedure Note TOMI DERMAN [id number] May 09, 1971  Procedure: Bronchoscopy Indications: Remove secretions Patient was emergently transferred to the ICU due to persistent seizure activity and desaturation into the low 70% range.  Attempted to obtain consent for procedure from wife but was unable to reach her by phone.  Proceeded with the bronchoscopy for secretion removal due to persistently low O2 saturations in the 70s once transferred to the ICU. Procedure Details Consent: Unable to obtain consent because of emergent medical necessity. Time Out: Verified patient identification, verified procedure, site/side was marked, verified correct patient position, special equipment/implants available, medications/allergies/relevent history reviewed, required imaging and test results available.  Performed  In preparation for procedure, bronchoscope lubricated. Sedation: NONE  Airway entered and the following bronchi were examined: RUL, RML, RLL, LUL, LLL and Bronchi.   Procedures performed: Large quantity of mucus was suctioned from both mainstem.  There was distal impacted mucus in the airways associated with the left mainstem bronchus.  These were not particularly dry mostly thick to thin and were easily removed.  All mucus that was impacted distally was removed to the best of my ability with serial lavage and suction.  Patient tolerated procedure well with O2 saturations of 100% at the end of procedure. Bronchoscope removed.  , Patient placed back on 100% FiO2 at conclusion of procedure.    Evaluation Hemodynamic Status: BP stable throughout; O2 sats: transiently fell during during procedure Patient's Current Condition: stable Specimens:  Sent serosanguinous fluid Complications: No apparent complications Patient did tolerate procedure well.  We will continue vigorous pulmonary toilet through the night with mechanical ventilation to ensure adequate secretion removal and adequate  oxygenation.   Shellia Cleverly 08/22/2019

## 2019-08-22 NOTE — Progress Notes (Signed)
Pt arrived from [facility] ICU. Pt with no noted responses other than in voluntary "twitching" like movements of extremities and eye/ face. Left eye opens more than right. Respirations rapid and shallow, heart rate tachy. Rapid Response RN at bedside, communicated with doctors and respiratory. Order obtained to return pt to [facility] ICU. Patient and his belongings returned to unit to same nurse that brought pt to 3W. MD in ICU attempted to contact wife.

## 2019-08-22 NOTE — Progress Notes (Signed)
PULMONARY / CRITICAL CARE MEDICINE   NAME:  Larry Banks, MRN:  [id number], DOB:  May 14, 1971, LOS: 21 ADMISSION DATE:  07/10/2019, CONSULTATION DATE:  07/18/2019  REFERRING MD:  EDP Dr. Tomi Banks   CHIEF COMPLAINT:  Cardiac Arrest/Resp Failure   BRIEF HISTORY:    48 year old male with medical history significant for hypertension, PAD, hyperlipidemia, CKD Stage 5 developed shortness of breath at home.  Patient does have CKD Stage 5 .  Is followed at [facility].  Recently had peritoneal dialysis catheter placed on August 27. Has not started Dialysis yet.  On arrival to the emergency room on 8/29  patient suffered a cardiac arrest with asystole.  He received CPR x15 minutes.  He receieved hyperkalemic protocol due to his underlying chronic kidney disease with 1 amp bicarb, 1 amp D50, insulin 10 units regular IV.  He received epi x3.  He had ROSC after 15 minutes.  He did require very brief Neo-Synephrine for hypotension.  He had significant agitation was started on sedation with Versed and Fentanyl.    Patient was transported to [facility] ICU for [name] to admit.  Remains intubated without significant neurologic recovery. EEG and CT suggestive of severe anoxic brain injury  SIGNIFICANT PAST MEDICAL HISTORY   Hypertension, hyperlipidemia, PAD, chronic kidney disease stage V  SIGNIFICANT EVENTS:  8/26-  TTS HD - last 07/29/2019 Larry Banks (old tunneled Rt HD cah) 8/27 - PD cath - for a planned change from iHD to PD 8/29- Cardiac arrest, CPR x15 minutes, epi x3. Per renal CXR- c/w pulmonary edema 8/31- 1 unit PRBC, paralyzed due to vent dysynchrony 9/1- off pressors 9/5 antibiotics restarted- fevers, leukocytosis, arrhythmias  STUDIES:   8/29 CT head  > no acute abnormality 8/29 CT abdomen pelvis > bilateral lower lobe consolidation with small pleural effusion.  Groundglass opacities lower portion of the lung.  Nonobstructive renal calculus, 8/29 Echo > ef 45-50%, hypokineses of the mid-inferior  and lateral wall 8/29 - EEG> diffuse slowing and generalized suppression suggestive of profound encephalopathy, no seizure activity 9/1 CT head>severe diffuse brain edema compatible with widespread hypoxic injury 9/2 EEG> suggestive ofprofound diffuse encephalopathy,could besecondary to anoxic/hypoxic brain injury.No seizures or epileptiform discharges were seen throughout the recording.  9/3 MRI brain>Diffuse hypoxic ischemic injury most pronounced affecting the cerebral hemispheric cortex but with some involvement also of the basal ganglia and thalami. 9/4 & 9/5: CXR- no opacities 9/13: CXR-no focal infiltrates or opacities.New feeding catheter extending into the stomach. No acute abnormality is seen. 9/16 CXR: no acute changes  CULTURES:  8/29 BC >>Negative  8/29 Sputum >> Rare GNR, GPC>> Normal [place]ora 8/29 COVID 19 >> NEG  MRSA surveillance negative  9/5 blood culture >>NGTD 9/5 urine culture >> negative 9/13 Sputum>> abundant GNR, rare GPC 9/13 Blood>>   ANTIBIOTICS:  Unasyn 8/29 >9/1 Cefepime 9/5> 9/11 vanc 9/5 > 9/9  LINES/TUBES:  Peritoneal cath 8/27 (PTA )  R HD cath ? >>  ------- 8/29 ETT >> 9/10 Trach (Larry Banks) 9/10 >>  8/29 L IJ CVL> 9/4 8/29 A line>>8/30 No foley. Unclear when D/ced.   CONSULTANTS:  Nephrology  Neurology  No events overnight, no new complaints, completely unresponsive  CONSTITUTIONAL: BP 94/74 (BP Location: Left Arm)   Pulse (!) 101   Temp 98.5 F (36.9 C) (Oral)   Resp 20   Ht 5\' 6"  (1.676 m)   Wt 76.9 kg   SpO2 100%   BMI 27.36 kg/m   I/O last 3 completed shifts: In:  836.9 [NG/GT:295; IV Piggyback:541.9] Out: -    PE General: Chronically ill appearing male, NAD, completely unresponsive HENT: [place]/AT, PERRL, EOM-spontaneous and MMM, trach in place cuffed 6 with cuff down CV: IIRIR, Nl S1/S2 and -M/R/G Lungs: Coarse bilaterally ABD: Rounded, PD cath in place. + BS x4.  Skin: warm. No rash. Neuro: not alert. Does not respond  to voice or follow commands. Withdraws to pain. PERRL.   I reviewed CXR myself, no acute disease noted from prior, trach in place  RESOLVED PROBLEM LIST  Discussed with neuro-MD and bedside RN  ASSESSMENT AND PLAN   48 year old male with history of ESRD on dialysis who presented to [facility] for 2d history of SOB and subsequently had PEA cardiac arrest while in ED. ROSC achieved after 74min of CPR and was transferred to [facility] ICU for further management.  PEA cardiac arrest in hospital. ROSC achieved after 83min of CPR. Etiology of arrest unclear. Transitioned from HD to PD and had missed dialysis the day of admission, presenting K was WNL.  S/p 36 degree TTM.    - Tele monitoring - Recommend withdrawal and comfort measures at this point - No changes in trach size or type  Malnutrition - Would recommend against G-tube unless family really wants to continue aggressive care, very little to add here  Acute hypoxic respiratory failure. Trached on 9/10 for prolonged ETT time. CXR today neg for acute changes. - Continue standard trach care - VAP protocol  Encephalopathy. EEG most consistent with anoxic brain injury. MRI with restricted diffusion throughout the entire cortex. - Prolonged support desired by family to allow for possible neurological improvement.  -Appreciate palliative care input  Rhythmic L hand movements: renally dosed keppra   ESRD stage 5, urinary retention requiring frequent straight catheterization. Hyperphosphatemia. -PD cath placed the end of august however is no longer a candidate in light of anoxic brain injury -MWF HD schedule P: -PD cath removal tomorrow -continue binders for hyperphosphatemia   Anemia of chronic disease and iron deficiency-stable. Avoid iron infusions 2/2 possible reaction/angioadema after receiving a dose  Hypertension-improved. Continue amlodipine and metoprolol  Type 2 DM, uncontrolled, but improved. Continue same Lantus 30 + SSI  Best  Practice / Goals of Care / Disposition.   Diet: Tube feeds restart when NG tube placed, will need PEG Pain/Anxiety/Delirium protocol (if indicated): prn meds  VAP protocol (if indicated): ordered DVT prophylaxis: heparin SQ GI prophylaxis: famotidine Glucose control: SSI, Lantus Mobility: Bed Code Status: Full Family Communication: No family at bedside 9/15 am Disposition: ICU  Rush Farmer, M.D. [facility] Pulmonary/Critical Care Medicine. Pager: [phone]. After hours pager: [phone].

## 2019-08-22 NOTE — Progress Notes (Signed)
Rebecka Apley, RN called and informed the patient's HD treatment has been moved to 08/23/19.

## 2019-08-22 NOTE — Progress Notes (Signed)
vLTM discontinued.  No skin breakdown noted 

## 2019-08-22 NOTE — Progress Notes (Signed)
PROGRESS NOTE    Larry Banks  IOX:[id number] DOB: [birth year] DOA: 07/28/2019 PCP: Curlene Labrum, MD   Brief Narrative:  48 year old with history of essential hypertension, peripheral arterial disease, hyperlipidemia, CKD stage V developed shortness of breath at home came to any pain hospital.  Usually follows with [facility] and recently was transitioned to peritoneal dialysis.  Upon arrival to the ER he went into cardiac arrest with asystole requiring CPR.  Transferred to ICU after ROSC 15 minutes later.  Initially required pressors.  HD catheter placed 8/26.  CT of the head was negative.  CT of the abdomen pelvis initially showed bilateral lower lobes pleural effusions nonobstructive renal stone.  Echocardiogram showed ejection fraction 45-50% with hypokinesis.  EEG showed profound slowing of brain activity but no seizure.  CT of the head repeated on 9/1 showed severe diffuse brain edema with widespread hypoxic injury.  Repeat EEG 9/2 showed profound diffuse encephalopathy secondary to hypoxic brain injury.  MRI brain 9/3 showed hypoxic diffuse brain injury.  Most of the cultures remain negative except sputum culture showed above-noted gram-negative rods.  Initially has received Unasyn, cefepime and vancomycin but currently off antibiotics.  Intubated from 8/29 to 9/10.   Assessment & Plan:   Active Problems:   Cardiac arrest ([place])   Encounter for central line placement   Acute respiratory failure (HCC)   Ventilator dependence ([place])   ESRD needing dialysis ([place])   Anoxic brain damage (HCC)   Seizures (HCC)   PEA cardiac arrest Encephalopathy secondary to anoxic brain injury Intermittent focal seizure, increasing frequency -CT of the head/MRI and EEG are consistent with anoxic brain injury. -Supportive care. -Status post tracheostomy.  Standard care only -Echocardiogram showed ejection fraction 45-50% with hypokinesis - Keppra 500 mg twice daily, clonazepam 1  mg 3 times daily.  Neurology reconsulted for their input.  Moderate protein calorie malnutrition Dysphagia due to encephalopathy -Plans for PEG tube placement postponed until next week given his seizures  ESRD on hemodialysis -Nephrology following.  Ongoing dialysis  Anemia of chronic disease/iron deficiency -Apparently developed angioedema reaction secondary to iron infusion.  We will hold off on this.  Monitor hemoglobin  Diabetes mellitus type 2, insulin-dependent -Resume home meds.  Insulin sliding scale Accu-Chek -Lantus 30 units daily -NovoLog 3 units every 4 hours  Essential hypertension with sinus tachycardia -On amlodipine and metoprolol.  Goals of care discussion- spoke with palliative care service and also patient's wife over the phone.  At this time she would like [place] to proceed with full scope of medical treatment and remain aggressive.  DVT prophylaxis: Subcu heparin Code Status: Full code Family Communication: Spoke with the patient's wife over the phone disposition Plan: To be determined  Consultants:   Nephrology  General surgery  [name]  Antimicrobials:  Unasyn 8/29 >9/1 Cefepime 9/5> 9/11 vanc 9/5 > 9/9   Subjective: Remains unresponsive and continues to have intermittent seizures and twitching  Review of Systems Otherwise negative except as per HPI, including: Unable to obtain Objective: Vitals:   08/22/19 0730 08/22/19 0752 08/22/19 0800 08/22/19 1000  BP: 98/85  94/74 (!) 87/67  Pulse: (!) 101   82  Resp: (!) 22  20 (!) 23  Temp:  98.5 F (36.9 C)    TempSrc:  Oral    SpO2: 100%  100% 100%  Weight:      Height:        Intake/Output Summary (Last 24 hours) at 08/22/2019 1126 Last data filed at  08/22/2019 0900 Gross per 24 hour  Intake 467.48 ml  Output -  Net 467.48 ml   Filed Weights   08/07/2019 1413 09/02/2019 0500 08/22/19 0500  Weight: 83.7 kg 78.2 kg 76.9 kg    Examination:  General exam: Feeding tube in place.  Remains  unresponsive Respiratory system: Bibasilar crackles with tracheostomy in place Cardiovascular system: Tachycardia Gastrointestinal system: PD catheter in place with any evidence of infection.  Positive bowel sounds Central nervous system: Unable to perform full examination Extremities: No contractures Skin: No rashes, lesions or ulcers Psychiatry: Very difficult to assess  Condom catheter in place Tracheostomy Right chest wall hemodialysis catheter    Data Reviewed:   CBC: Recent Labs  Lab 08/17/19 0743 08/18/19 0256 08/19/19 0220 08/25/2019 0239 08/07/2019 0219  WBC 14.7* 13.8* 11.5* 11.4* 11.6*  HGB 9.8* 9.7* 10.6* 10.1* 10.6*  HCT 31.8* 30.0* 34.2* 32.4* 35.0*  MCV 90.6 89.3 90.2 90.0 91.1  PLT 467* 458* 481* 456* 497*   Basic Metabolic Panel: Recent Labs  Lab 08/17/19 0743 08/18/19 0256 08/18/19 1208 08/19/19 0220 08/18/2019 0537 08/10/2019 0219 08/22/19 0219  NA 136 137  --  136  136 135 134*  135 134*  K 5.3* 4.8  --  4.4  4.4 5.4* 5.3*  5.3* 6.2*  CL 92* 92*  --  92*  92* 90* 92*  93* 89*  CO2 19* 19*  --  22  21* 21* 21*  22 15*  GLUCOSE 208* 180*  --  191*  191* 110* 117*  120* 74  BUN 157* 201* 60* 109*  104* 179* 91*  90* 148*  CREATININE 10.03* 11.54*  --  6.79*  6.70* 10.11* 6.93*  6.85* 9.74*  CALCIUM 9.6 9.4  --  9.7  9.6 9.5 9.3  9.4 9.4  MG  --   --   --  2.8*  --   --   --   PHOS 9.2* 10.8*  --  6.3* 9.6* 8.2*  --    GFR: Estimated Creatinine Clearance: 9 mL/min (A) (by C-G formula based on SCr of 9.74 mg/dL (H)). Liver Function Tests: Recent Labs  Lab 08/17/19 0743 08/18/19 0256 08/19/19 0220 09/02/2019 0537 08/13/2019 0219  ALBUMIN 2.9* 2.8* 3.2* 3.1* 3.1*   No results for input(s): LIPASE, AMYLASE in the last 168 hours. No results for input(s): AMMONIA in the last 168 hours. Coagulation Profile: No results for input(s): INR, PROTIME in the last 168 hours. Cardiac Enzymes: No results for input(s): CKTOTAL, CKMB, CKMBINDEX,  TROPONINI in the last 168 hours. BNP (last 3 results) No results for input(s): PROBNP in the last 8760 hours. HbA1C: No results for input(s): HGBA1C in the last 72 hours. CBG: Recent Labs  Lab 08/16/2019 1525 08/09/2019 1927 08/07/2019 2330 08/22/19 0350 08/22/19 0751  GLUCAP 85 74 86 73 88   Lipid Profile: No results for input(s): CHOL, HDL, LDLCALC, TRIG, CHOLHDL, LDLDIRECT in the last 72 hours. Thyroid Function Tests: No results for input(s): TSH, T4TOTAL, FREET4, T3FREE, THYROIDAB in the last 72 hours. Anemia Panel: No results for input(s): VITAMINB12, FOLATE, FERRITIN, TIBC, IRON, RETICCTPCT in the last 72 hours. Sepsis Labs: Recent Labs  Lab 08/17/19 0751  PROCALCITON 7.29    Recent Results (from the past 240 hour(s))  Expectorated sputum assessment w rflx to resp cult     Status: None   Collection Time: 08/16/19  3:29 PM   Specimen: Tracheal Aspirate; Sputum  Result Value Ref Range Status   Specimen Description TRACHEAL ASPIRATE  Final   Special Requests NONE  Final   Sputum evaluation   Final    THIS SPECIMEN IS ACCEPTABLE FOR SPUTUM CULTURE Performed at [facility], [address], [place], [place] [address]    Report Status 08/16/2019 FINAL  Final  Culture, respiratory     Status: None   Collection Time: 08/16/19  3:29 PM   Specimen: Tracheal Aspirate  Result Value Ref Range Status   Specimen Description TRACHEAL ASPIRATE  Final   Special Requests NONE Reflexed from [id number]  Final   Gram Stain   Final    ABUNDANT SQUAMOUS EPITHELIAL CELLS PRESENT ABUNDANT WBC PRESENT, PREDOMINANTLY PMN ABUNDANT GRAM NEGATIVE RODS RARE GRAM POSITIVE COCCI    Culture   Final    FEW Consistent with normal respiratory flora. Performed at [facility], [place] [address], [place], [place] [address]    Report Status 08/19/2019 FINAL  Final  Culture, blood (routine x 2)     Status: None   Collection Time: 08/16/19  4:28 PM   Specimen: BLOOD  Result Value Ref Range  Status   Specimen Description BLOOD LEFT ANTECUBITAL  Final   Special Requests AEROBIC BOTTLE ONLY Blood Culture adequate volume  Final   Culture   Final    NO GROWTH 5 DAYS Performed at [place] [address], [place], [place] [address]    Report Status 08/28/2019 FINAL  Final  Culture, blood (routine x 2)     Status: None   Collection Time: 08/16/19  4:39 PM   Specimen: BLOOD  Result Value Ref Range Status   Specimen Description BLOOD BLOOD RIGHT HAND  Final   Special Requests AEROBIC BOTTLE ONLY Blood Culture adequate volume  Final   Culture   Final    NO GROWTH 5 DAYS Performed at [facility], [place] [address], [place], [place] [address]    Report Status 08/25/2019 FINAL  Final         Radiology Studies: No results found.      Scheduled Meds: . amLODipine  5 mg Per Tube Daily  . calcium acetate (Phos Binder)  1,334 mg Per Tube TID WC  . chlorhexidine gluconate (MEDLINE KIT)  15 mL Mouth Rinse BID  . Chlorhexidine Gluconate Cloth  6 each Topical Daily  . clonazepam  2 mg Per Tube TID  . darbepoetin (ARANESP) injection - DIALYSIS  60 mcg Intravenous Q Tue-HD  . famotidine  10 mg Per Tube BID  . fentaNYL (SUBLIMAZE) injection  200 mcg Intravenous Once  . insulin aspart  0-20 Units Subcutaneous Q4H  . insulin aspart  3 Units Subcutaneous Q4H  . insulin glargine  30 Units Subcutaneous Daily  . mouth rinse  15 mL Mouth Rinse 10 times per day  . metoprolol tartrate  12.5 mg Per NG tube BID  . sodium chloride flush  3 mL Intravenous Q12H   Continuous Infusions: . sodium chloride 250 mL (08/16/19 1736)  . sodium chloride    . sodium chloride    . sodium chloride    . sodium chloride    . sodium chloride 10 mL/hr at 08/22/19 0700  . sodium chloride    . sodium chloride    . levETIRAcetam Stopped (08/22/19 0524)  . valproate sodium 750 mg (08/16/2019 1400)     LOS: 21 days   Time spent= 35 mins    Lilleigh Hechavarria Arsenio Loader, MD [facility]   If 7PM-7AM, please contact night-coverage [email or web address]  08/22/2019, 11:26 AM

## 2019-08-22 NOTE — Plan of Care (Signed)
   Problem: Activity: Goal: Risk for activity intolerance will decrease Outcome: Not Progressing   Problem: Nutrition: Goal: Adequate nutrition will be maintained Outcome: Not Progressing-TF held due to aspiration risk, plan for PEG   Problem: Elimination: Goal: Will not experience complications related to urinary retention Outcome: Not Progressing-required I&O cath

## 2019-08-23 ENCOUNTER — Encounter (HOSPITAL_COMMUNITY): Payer: Self-pay

## 2019-08-23 ENCOUNTER — Inpatient Hospital Stay (HOSPITAL_COMMUNITY): Payer: 59

## 2019-08-23 DIAGNOSIS — R569 Unspecified convulsions: Secondary | ICD-10-CM

## 2019-08-23 DIAGNOSIS — Z930 Tracheostomy status: Secondary | ICD-10-CM

## 2019-08-23 DIAGNOSIS — J810 Acute pulmonary edema: Secondary | ICD-10-CM

## 2019-08-23 LAB — CBC
HCT: 28.9 % — ABNORMAL LOW (ref 39.0–52.0)
Hemoglobin: 9.3 g/dL — ABNORMAL LOW (ref 13.0–17.0)
MCH: 29.1 pg (ref 26.0–34.0)
MCHC: 32.2 g/dL (ref 30.0–36.0)
MCV: 90.3 fL (ref 80.0–100.0)
Platelets: 377 10*3/uL (ref 150–400)
RBC: 3.2 MIL/uL — ABNORMAL LOW (ref 4.22–5.81)
RDW: 14.3 % (ref 11.5–15.5)
WBC: 12.7 10*3/uL — ABNORMAL HIGH (ref 4.0–10.5)
nRBC: 0 % (ref 0.0–0.2)

## 2019-08-23 LAB — GLUCOSE, CAPILLARY
Glucose-Capillary: 119 mg/dL — ABNORMAL HIGH (ref 70–99)
Glucose-Capillary: 120 mg/dL — ABNORMAL HIGH (ref 70–99)
Glucose-Capillary: 133 mg/dL — ABNORMAL HIGH (ref 70–99)
Glucose-Capillary: 96 mg/dL (ref 70–99)
Glucose-Capillary: 96 mg/dL (ref 70–99)
Glucose-Capillary: 98 mg/dL (ref 70–99)

## 2019-08-23 LAB — POCT I-STAT 7, (LYTES, BLD GAS, ICA,H+H)
Acid-Base Excess: 1 mmol/L (ref 0.0–2.0)
Bicarbonate: 26 mmol/L (ref 20.0–28.0)
Calcium, Ion: 1.02 mmol/L — ABNORMAL LOW (ref 1.15–1.40)
HCT: 31 % — ABNORMAL LOW (ref 39.0–52.0)
Hemoglobin: 10.5 g/dL — ABNORMAL LOW (ref 13.0–17.0)
O2 Saturation: 63 %
Potassium: 4.4 mmol/L (ref 3.5–5.1)
Sodium: 135 mmol/L (ref 135–145)
TCO2: 27 mmol/L (ref 22–32)
pCO2 arterial: 40.4 mmHg (ref 32.0–48.0)
pH, Arterial: 7.417 (ref 7.350–7.450)
pO2, Arterial: 32 mmHg — CL (ref 83.0–108.0)

## 2019-08-23 LAB — BASIC METABOLIC PANEL
Anion gap: 32 — ABNORMAL HIGH (ref 5–15)
BUN: 195 mg/dL — ABNORMAL HIGH (ref 6–20)
CO2: 13 mmol/L — ABNORMAL LOW (ref 22–32)
Calcium: 8.6 mg/dL — ABNORMAL LOW (ref 8.9–10.3)
Chloride: 88 mmol/L — ABNORMAL LOW (ref 98–111)
Creatinine, Ser: 13.02 mg/dL — ABNORMAL HIGH (ref 0.61–1.24)
GFR calc Af Amer: 5 mL/min — ABNORMAL LOW (ref >60)
GFR calc non Af Amer: 4 mL/min — ABNORMAL LOW (ref >60)
Glucose, Bld: 112 mg/dL — ABNORMAL HIGH (ref 70–99)
Potassium: >7.5 mmol/L (ref 3.5–5.1)
Sodium: 133 mmol/L — ABNORMAL LOW (ref 135–145)

## 2019-08-23 LAB — ALBUMIN: Albumin: 3 g/dL — ABNORMAL LOW (ref 3.5–5.0)

## 2019-08-23 LAB — PHOSPHORUS: Phosphorus: >30 mg/dL — ABNORMAL HIGH (ref 2.5–4.6)

## 2019-08-23 LAB — VALPROIC ACID LEVEL: Valproic Acid Lvl: 70 ug/mL (ref 50.0–100.0)

## 2019-08-23 MED ORDER — SODIUM CHLORIDE 0.9 % IV SOLN
200.0000 mg | INTRAVENOUS | Status: AC
  Administered 2019-08-23: 200 mg via INTRAVENOUS
  Filled 2019-08-23: qty 20

## 2019-08-23 MED ORDER — SODIUM CHLORIDE 0.9 % IV SOLN
100.0000 mg | Freq: Two times a day (BID) | INTRAVENOUS | Status: DC
  Administered 2019-08-23 – 2019-08-24 (×3): 100 mg via INTRAVENOUS
  Filled 2019-08-23 (×4): qty 10

## 2019-08-23 MED ORDER — LORAZEPAM 2 MG/ML IJ SOLN
2.0000 mg | Freq: Once | INTRAMUSCULAR | Status: AC
  Administered 2019-08-23: 2 mg via INTRAVENOUS

## 2019-08-23 NOTE — Progress Notes (Signed)
[facility]    NEPHROLOGY PROGRESS NOTE  SUBJECTIVE:  Bumped from HD yesterday d/t emergencies, for HD today.  Multiple seizures, requiring increased AEDs.  Per notes, burst suppression was declined.  Back on vent.    OBJECTIVE:  Vitals:   08/23/19 0915 08/23/19 0930  BP: (!) 88/57 93/76  Pulse: (!) 103 (!) 105  Resp:    Temp:    SpO2:      Intake/Output Summary (Last 24 hours) at 08/23/2019 7893 Last data filed at 08/23/2019 0400 Gross per 24 hour  Intake 172.49 ml  Output 401 ml  Net -228.51 ml      General: NAD, unresponsive, actively seizing HEENT: eyes closed, trach in place, on vent CV:  Heart RRR Lungs:  Clear anteriorly Abd:  abd SNT/ND with normal BS, some distention but soft Extremities:  Trace LE edema NEURO: stereotypic movements observed, improved with Ativan  ACCESS: RLQ PD cath, R IJ TDC  MEDICATIONS:  . amLODipine  5 mg Per Tube Daily  . calcium acetate (Phos Binder)  1,334 mg Per Tube TID WC  . chlorhexidine gluconate (MEDLINE KIT)  15 mL Mouth Rinse BID  . Chlorhexidine Gluconate Cloth  6 each Topical Daily  . clonazepam  2 mg Per Tube TID  . darbepoetin (ARANESP) injection - DIALYSIS  60 mcg Intravenous Q Tue-HD  . famotidine  10 mg Per Tube BID  . fentaNYL (SUBLIMAZE) injection  200 mcg Intravenous Once  . insulin aspart  0-20 Units Subcutaneous Q4H  . insulin aspart  3 Units Subcutaneous Q4H  . insulin glargine  30 Units Subcutaneous Daily  . mouth rinse  15 mL Mouth Rinse 10 times per day  . metoprolol tartrate  12.5 mg Per NG tube BID  . sodium chloride flush  3 mL Intravenous Q12H       LABS:   CBC Latest Ref Rng & Units 08/23/2019 08/22/2019 08/09/2019  WBC 4.0 - 10.5 K/uL 12.7(H) - 11.6(H)  Hemoglobin 13.0 - 17.0 g/dL 9.3(L) 10.2(L) 10.6(L)  Hematocrit 39.0 - 52.0 % 28.9(L) 30.0(L) 35.0(L)  Platelets 150 - 400 K/uL 377 - 401(H)    CMP Latest Ref Rng & Units 08/23/2019 08/22/2019 08/22/2019  Glucose 70 - 99 mg/dL 112(H)  - 74  BUN 6 - 20 mg/dL 195(H) - 148(H)  Creatinine 0.61 - 1.24 mg/dL 13.02(H) - 9.74(H)  Sodium 135 - 145 mmol/L 133(L) 128(L) 134(L)  Potassium 3.5 - 5.1 mmol/L >7.5(HH) 7.4(HH) 6.2(H)  Chloride 98 - 111 mmol/L 88(L) - 89(L)  CO2 22 - 32 mmol/L 13(L) - 15(L)  Calcium 8.9 - 10.3 mg/dL 8.6(L) - 9.4  Total Protein 6.5 - 8.1 g/dL - - -  Total Bilirubin 0.3 - 1.2 mg/dL - - -  Alkaline Phos 38 - 126 U/L - - -  AST 15 - 41 U/L - - -  ALT 0 - 44 U/L - - -    Lab Results  Component Value Date   PTH 93 (H) 07/26/2019   CALCIUM 8.6 (L) 08/23/2019   CAION 1.02 (L) 08/22/2019   PHOS >30.0 (H) 08/23/2019       Component Value Date/Time   COLORURINE YELLOW 08/08/2019 0106   APPEARANCEUR CLEAR 08/08/2019 0106   LABSPEC 1.014 08/08/2019 0106   PHURINE 5.0 08/08/2019 0106   GLUCOSEU NEGATIVE 08/08/2019 0106   HGBUR MODERATE (A) 08/08/2019 0106   BILIRUBINUR NEGATIVE 08/08/2019 0106   KETONESUR NEGATIVE 08/08/2019 0106   PROTEINUR 100 (A) 08/08/2019 0106   NITRITE NEGATIVE  08/08/2019 0106   LEUKOCYTESUR NEGATIVE 08/08/2019 0106      Component Value Date/Time   PHART 7.249 (L) 08/22/2019 2338   PCO2ART 38.0 08/22/2019 2338   PO2ART 303.0 (H) 08/22/2019 2338   HCO3 16.6 (L) 08/22/2019 2338   TCO2 18 (L) 08/22/2019 2338   ACIDBASEDEF 10.0 (H) 08/22/2019 2338   O2SAT 100.0 08/22/2019 2338       Component Value Date/Time   IRON 8 (L) 07/09/2019 1336   TIBC 307 07/21/2019 1336   IRONPCTSAT 3 (L) 07/19/2019 1336       ASSESSMENT/PLAN:     1. ESRD THS Via R IJ TDC with [facility].  Azotemia noted, likely from catabolic state/ TF.  HD previously on MWF converted to TTS sched.  Next planned HD Tuesday.  2. Seizures: on Keppra, worsening seizures occurring over the week--> neuro reconsulted --> still having seizures, appreciate their assistance--> Adding AEDs and benzos, burst suppression declined previously.    3. Anoxic brain injury - continue scope of care with all aggressive interventions  per family- EEG with essentially devastating injury, temporal lobe spikes, low likelihood of recovery.  He is unlikely to achieve prior level of functioning and quality of life.  Support ethics c/s if deemed appropriate.  4. S/p PD cath placement 8/27: no longer a PD candidate given anoxic brain injury.  Was going to have PD cath removed.  Appreciate the assistance of gen surg-->Agree with Dr. Pollie Friar note, further procedures/ invasive interventions likely to achieve overall benefit.  Will not pursue at this time esp with increased seizure activity     5. VDRF - back on the vent  6. Hyperphosphatemia on binders  7. Anemia of CKD.  Hgb stable.  Iron low, had angioedema to ferrlicet vs antibiotic.  Listed as allergy, d/w pharmacy. ON aranesp 60 q week  8. Dispo: he has a devastating neurologic injury with poor prognosis and slim hope of functional recovery.  Dialysis is not adding to his QOL and he remains a suboptimal candidate for short or long term dialysis.

## 2019-08-23 NOTE — Progress Notes (Addendum)
I initially was called to the floor due to the patient having continuous seizure activity.  He continues to have twitching and intermittent myoclonic activity.  Though I am not certain that this is seizure, I do not think that a stat EEG would help to clarify given that previous clinical events have been EEG negative as well.  Earlier the team had discussed with his wife and decided to not pursue burst suppression.  Given that the activity was now constant, I did call his wife to confirm this, and but am not certain how much of the conversation she understood given the late hour.   She continually asked me "do not give him anything that will kill him."  I did discuss that there are risks associated with propofol including hypotension, bradycardia, but the intention was not to kill him but rather to stop the twitching.  Given that the team had already discussed this and decided against it, as well as the fact that my suspicion is that his prognosis is dismal and that aggressive treatment of this would be futile in any case, I do think that it is reasonable to continue with avoiding propofol or other general anesthetics in this case.  Given that his prognosis is so likely to be dismal and the degree of care that he is continuing to require, I do wonder if we are reaching a point where ethics needs to be involved.   I will give a trial of Vimpat to see if it helps and she was in agreement with this plan.  This patient is critically ill and at significant risk of neurological worsening, death and care requires constant monitoring of vital signs, hemodynamics,respiratory and cardiac monitoring, neurological assessment, discussion with family, other specialists and medical decision making of high complexity. I spent 50 minutes of neurocritical care time  in the care of  this patient. This was time spent independent of any time provided by nurse practitioner or PA.  Roland Rack, MD [facility] [phone]  If 7pm- 7am, please page neurology on call as listed in [place]. 08/23/2019  12:43 AM

## 2019-08-23 NOTE — Plan of Care (Signed)
  Problem: Clinical Measurements: Goal: Ability to maintain clinical measurements within normal limits will improve Outcome: Progressing Goal: Respiratory complications will improve Outcome: Not Progressing Note: Patient had a bronchoscopy performed due to heavy secretions and oxygen desaturation. Patient placed back on ventilator. Goal: Cardiovascular complication will be avoided Outcome: Progressing   Problem: Activity: Goal: Risk for activity intolerance will decrease Outcome: Not Progressing   Problem: Nutrition: Goal: Adequate nutrition will be maintained Outcome: Not Progressing   Problem: Elimination: Goal: Will not experience complications related to urinary retention Outcome: Not Progressing   Problem: Skin Integrity: Goal: Risk for impaired skin integrity will decrease Outcome: Progressing   Problem: Respiratory: Goal: Ability to maintain a clear airway and adequate ventilation will improve Outcome: Not Progressing   Problem: Role Relationship: Goal: Method of communication will improve Outcome: Not Progressing   Problem: Neurologic: Goal: Promote progressive neurologic recovery Outcome: Not Progressing

## 2019-08-23 NOTE — Procedures (Addendum)
Patient Name:Larry Banks BLOOD [id number] Epilepsy Attending:Brittny Spangle [name] Referring Physician/Provider:Dr Zeb Comfort Duration: 08/05/2019 1331 to 08/22/2019 1200  Patient history:48yo M with cardiac arrest. EEG to evaluate for seizure.  Level of alertness:obtunded  AEDs during EEG study:LEV  Technical aspects: This EEG study was done with scalp electrodes positioned according to the 10-20 International system of electrode placement. Electrical activity was acquired at a sampling rate of 500Hz  and reviewed with a high frequency filter of 70Hz  and a low frequency filter of 1Hz . EEG data were recorded continuously and digitally stored.  DESCRIPTION:EEG showed generalized suppression reactive to tactile stimulation. After around 1427 on 08/23/2019 patient started having seizures during which he was noted to have right or left gaze deviation and head turn to right or left followed by right or left sided jerking, lasting avg 3 minutes. Concomitant EEG showed significant myogenic artifact and no clear evolving seizure pattern was seen.    ABNORMALITY 1. Seizure 2. Generalized suppression  IMPRESSION:  This study showed severe to profound diffuse encephalopathy. Frequent episodes as described above with no clear EEG change were seen. The semiology was consistent with seizures in setting of diffuse hypoxic/anoxic brain injury. Clinical correlation is recommended.  [name] [name]

## 2019-08-23 NOTE — Procedures (Signed)
Patient Name: Larry Banks  MRN: [id number]  Epilepsy Attending: Lora Havens  Referring Physician/Provider: Dr Remi Haggard Aroor Date: 08/23/2019  Duration: 21.08 mins  Patient history: 48yo M post cardiac arrest with diffuse anoxic/hypoxic brain injury. EEG to evaluate for seizure  Level of alertness: comatose  AEDs during EEG study: LEV, VPA, clonazepam, Vimpat  Technical aspects: This EEG study was done with scalp electrodes positioned according to the 10-20 International system of electrode placement. Electrical activity was acquired at a sampling rate of 500Hz  and reviewed with a high frequency filter of 70Hz  and a low frequency filter of 1Hz . EEG data were recorded continuously and digitally stored.   DESCRIPTION: EEG showed generalized background suppression. EEG was not reactive to stimulation. Hyperventilation and photic stimulation were not performed.  IMPRESSION: This study is suggestive of profound diffuse encephalopathy. No seizures or epileptiform discharges were seen throughout the recording.  Larry Banks

## 2019-08-23 NOTE — Progress Notes (Signed)
EEG complete - results pending 

## 2019-08-23 NOTE — Progress Notes (Addendum)
Reason for consult: Seizures, Anoxic brain injury   Subjective: Patient no longer having clinical seizure activity this morning upon my assessment.   ROS: Unable to obtain due to poor mental status  Examination  Vital signs in last 24 hours: Temp:  [98.3 F (36.8 C)-99.8 F (37.7 C)] 98.3 F (36.8 C) (09/20 1128) Pulse Rate:  [20-132] 105 (09/20 1215) Resp:  [14-43] 22 (09/20 1215) BP: (83-180)/(32-158) 87/71 (09/20 1215) SpO2:  [39 %-100 %] 100 % (09/20 1111) FiO2 (%):  [28 %-100 %] 40 % (09/20 1111) Weight:  [76.8 kg-79 kg] 79 kg (09/20 0410)  General: lying in bed CVS: pulse-normal rate and rhythm RS: breathing comfortably Extremities: normal   Neuro: MS: Eyes closed, not following any commands, nonverbal does not track examiner CN: Pupils are equal and reactive, oculocephalic and corneal reflexes present, gag reflex present Motor: Minimal withdrawal to bilateral upper extremities, no spontaneous movement Reflexes: 2+ reflexes over bilateral biceps, 2+over b/l patella Plantar: Extensor bilaterally Gait: not tested  Basic Metabolic Panel: Recent Labs  Lab 08/18/19 0256  08/19/19 0220 08/05/2019 0537 08/25/2019 0219 08/22/19 0219 08/22/19 2338 08/23/19 0544 08/23/19 0940  NA 137  --  136  136 135 134*  135 134* 128* 133* 135  K 4.8  --  4.4  4.4 5.4* 5.3*  5.3* 6.2* 7.4* >7.5* 4.4  CL 92*  --  92*  92* 90* 92*  93* 89*  --  88*  --   CO2 19*  --  22  21* 21* 21*  22 15*  --  13*  --   GLUCOSE 180*  --  191*  191* 110* 117*  120* 74  --  112*  --   BUN 201*   < > 109*  104* 179* 91*  90* 148*  --  195*  --   CREATININE 11.54*  --  6.79*  6.70* 10.11* 6.93*  6.85* 9.74*  --  13.02*  --   CALCIUM 9.4  --  9.7  9.6 9.5 9.3  9.4 9.4  --  8.6*  --   MG  --   --  2.8*  --   --   --   --   --   --   PHOS 10.8*  --  6.3* 9.6* 8.2*  --   --  >30.0*  --    < > = values in this interval not displayed.    CBC: Recent Labs  Lab 08/18/19 0256 08/19/19 0220  08/16/2019 0239 08/27/2019 0219 08/22/19 2338 08/23/19 0544 08/23/19 0940  WBC 13.8* 11.5* 11.4* 11.6*  --  12.7*  --   HGB 9.7* 10.6* 10.1* 10.6* 10.2* 9.3* 10.5*  HCT 30.0* 34.2* 32.4* 35.0* 30.0* 28.9* 31.0*  MCV 89.3 90.2 90.0 91.1  --  90.3  --   PLT 458* 481* 456* 401*  --  377  --      Coagulation Studies: No results for input(s): LABPROT, INR in the last 72 hours.      ASSESSMENT AND PLAN  48 year old male s/p cardiac arrest with resultant severe anoxic brain injury and neurology reconsulted for frequent seizure like activity.  Severe anoxic brain injury Recurrent clinical seizure activity with no clear EEG correlate: (could possibly represent subcortical seizures )  Recommendations -  VPA to 750 mg TID, level 70 on 9/20 -  Klonapin to 2mg  TID - Continue Leviteracetam to 1g BID - Continue Vimpat 100mg  BID - Ativan PRN 1 mg for seizure lasting greater than >  23min  - Goals of care discussion below   I had a long detailed conversation with the wife regarding further management of seizures.  Currently patient does not appear to be clinically seizing however did discuss the high possibility that his seizures could return and he could possibly seize continuously.  I stated that we currently have him on 4 seizure medicatons (Keppra, valproic acid, Vimpat and Klonopin) and that additional antiepileptic agents would less likely help.  The alternative would be to pursue burst suppression.  I explained to the patient's wife, her son and her sister-in-law who was on the phone what that would entail, with complications including increasing risk for infection and cardiac arrest with a  small hope that this could help stop seizures with no guarantee that it would change neurological recovery.  She also understood that if he continued to seize, that he would be at risk for aspiration and other complications, could prevent procedure such as PEG tube but we were in agreement not to pursue  burst suppression.   I also offered to show her the patient's MRI of brain to help her but better understand the reason for his continued seizures, however she declined and she has understood that he has had a severe injury stating along the lines " I can see it with my own eyes".     She stated that the plan going forward would be to continue the seizure medications that he is and she would pray that he has no further seizures and continue to make improvements.   Goals of care were established regarding seizure management/status epilepticus would be along the lines of adjusting AEDs as needed to help control seizures as much as possible without pursuing burst suppression.  CRITICAL CARE Performed by: Lanice Schwab Myer Bohlman   Total critical care time: 45  minutes  Critical care time was exclusive of separately billable procedures and treating other patients.  Critical care was necessary to treat or prevent imminent or life-threatening deterioration.  Critical care was time spent personally by me on the following activities: development of treatment plan with patient and/or surrogate as well as nursing, discussions with consultants, evaluation of patient's response to treatment, examination of patient, obtaining history from patient or surrogate, ordering and performing treatments and interventions, ordering and review of laboratory studies, ordering and review of radiographic studies, pulse oximetry and re-evaluation of patient's condition.  Greater than 30 minutes was spent discussing goals of care with patient.    Karena Addison Mackie Holness [facility] Pager Number [phone] For questions after 7pm please refer to AMION to reach the Neurologist on call

## 2019-08-23 NOTE — Progress Notes (Signed)
PULMONARY / CRITICAL CARE MEDICINE   NAME:  Larry Banks, MRN:  [id number], DOB:  02-Oct-1971, LOS: 67 ADMISSION DATE:  08/02/2019, CONSULTATION DATE:  07/21/2019  REFERRING MD:  EDP Dr. Tomi Bamberger   CHIEF COMPLAINT:  Cardiac Arrest/Resp Failure   BRIEF HISTORY:    48 year old male with medical history significant for hypertension, PAD, hyperlipidemia, CKD Stage 5 developed shortness of breath at home.  Patient does have CKD Stage 5 .  Is followed at [facility].  Recently had peritoneal dialysis catheter placed on August 27. Has not started Dialysis yet.  On arrival to the emergency room on 8/29  patient suffered a cardiac arrest with asystole.  He received CPR x15 minutes.  He receieved hyperkalemic protocol due to his underlying chronic kidney disease with 1 amp bicarb, 1 amp D50, insulin 10 units regular IV.  He received epi x3.  He had ROSC after 15 minutes.  He did require very brief Neo-Synephrine for hypotension.  He had significant agitation was started on sedation with Versed and Fentanyl.    Patient was transported to [facility] ICU for [name] to admit.  Remains intubated without significant neurologic recovery. EEG and CT suggestive of severe anoxic brain injury  SIGNIFICANT PAST MEDICAL HISTORY   Hypertension, hyperlipidemia, PAD, chronic kidney disease stage V  SIGNIFICANT EVENTS:  8/26-  TTS HD - last 07/29/2019 [name] (old tunneled Rt HD cah) 8/27 - PD cath - for a planned change from iHD to PD 8/29- Cardiac arrest, CPR x15 minutes, epi x3. Per renal CXR- c/w pulmonary edema 8/31- 1 unit PRBC, paralyzed due to vent dysynchrony 9/1- off pressors 9/5 antibiotics restarted- fevers, leukocytosis, arrhythmias  STUDIES:   8/29 CT head  > no acute abnormality 8/29 CT abdomen pelvis > bilateral lower lobe consolidation with small pleural effusion.  Groundglass opacities lower portion of the lung.  Nonobstructive renal calculus, 8/29 Echo > ef 45-50%, hypokineses of the mid-inferior  and lateral wall 8/29 - EEG> diffuse slowing and generalized suppression suggestive of profound encephalopathy, no seizure activity 9/1 CT head>severe diffuse brain edema compatible with widespread hypoxic injury 9/2 EEG> suggestive ofprofound diffuse encephalopathy,could besecondary to anoxic/hypoxic brain injury.No seizures or epileptiform discharges were seen throughout the recording.  9/3 MRI brain>Diffuse hypoxic ischemic injury most pronounced affecting the cerebral hemispheric cortex but with some involvement also of the basal ganglia and thalami. 9/4 & 9/5: CXR- no opacities 9/13: CXR-no focal infiltrates or opacities.New feeding catheter extending into the stomach. No acute abnormality is seen. 9/16 CXR: no acute changes  CULTURES:  8/29 BC >>Negative  8/29 Sputum >> Rare GNR, GPC>> Normal [place]ora 8/29 COVID 19 >> NEG  MRSA surveillance negative  9/5 blood culture >>NGTD 9/5 urine culture >> negative 9/13 Sputum>> abundant GNR, rare GPC 9/13 Blood>>   ANTIBIOTICS:  Unasyn 8/29 >9/1 Cefepime 9/5> 9/11 vanc 9/5 > 9/9  LINES/TUBES:  Peritoneal cath 8/27 (PTA )  R HD cath ? >>  ------- 8/29 ETT >> 9/10 Trach (JY) 9/10 >>  8/29 L IJ CVL> 9/4 8/29 A line>>8/30 No foley. Unclear when D/ced.   CONSULTANTS:  Nephrology  Neurology  Seizures overnight with complete whiteout required placement back on the vent and bronchoscopy Neurology evaluated and wife refused burst suppression  CONSTITUTIONAL: BP (!) 180/158   Pulse 98   Temp 99.4 F (37.4 C) (Axillary)   Resp 19   Ht 5\' 6"  (1.676 m)   Wt 79 kg   SpO2 100%   BMI 28.11 kg/m  I/O last 3 completed shifts: In: 482.5 [NG/GT:225; IV Piggyback:257.5] Out: 401 [Urine:400; Stool:1]  PE General: Acute on chronically ill appearing male, actively seizing but wife is refusing offered burst suppression HENT: [place]/AT, pupils are non-reactive with no dolls eyes, trach in place CV: IRIR, Nl S1/S2 and -M/R/G Lungs: CTA  bilaterally ABD: Soft, NT, ND and +BS Skin: warm. No rash. Neuro: Not arousable, actively seizing vs myoclonus, responds some to ativan and lack of stimulation (quited down once no longer being stimulate).  I reviewed CXR myself, no acute disease noted from prior, trach in place  RESOLVED PROBLEM LIST  Discussed with neuro-MD and bedside RN  ASSESSMENT AND PLAN   48 year old male with history of ESRD on dialysis who presented to [facility] for 2d history of SOB and subsequently had PEA cardiac arrest while in ED. ROSC achieved after 11min of CPR and was transferred to [facility] ICU for further management.  PEA cardiac arrest in hospital. ROSC achieved after 27min of CPR. Etiology of arrest unclear. Transitioned from HD to PD and had missed dialysis the day of admission, presenting K was WNL.  S/p 36 degree TTM.    - Continue tele monitoring - Anti-HTN as ordered with holding parameters - Will involve ethics in AM as there is not reasonable chance at any recovery here but I do not believe wife quite understands the extent of the patient's illness - No changes in trach size or type - Will need palliative as well  Malnutrition - Would recommend against G-tube unless family really wants to continue aggressive care, very little to add here  Acute hypoxic respiratory failure. Trached on 9/10 for prolonged ETT time. CXR today neg for acute changes. - Continue standard trach care - VAP protocol  Encephalopathy. EEG most consistent with anoxic brain injury. MRI with restricted diffusion throughout the entire cortex. - Prolonged support desired by family to allow for possible neurological improvement.  -Appreciate palliative care input  Rhythmic L hand movements: renally dosed keppra   ESRD stage 5, urinary retention requiring frequent straight catheterization. Hyperphosphatemia. -PD cath placed the end of august however is no longer a candidate in light of anoxic brain injury -MWF HD  schedule P: -PD cath removal tomorrow -continue binders for hyperphosphatemia   Anemia of chronic disease and iron deficiency-stable. Avoid iron infusions 2/2 possible reaction/angioadema after receiving a dose  Hypertension-improved. Continue amlodipine and metoprolol  Type 2 DM, uncontrolled, but improved. Continue same Lantus 30 + SSI  Best Practice / Goals of Care / Disposition.   Diet: Tube feeds restart when NG tube placed, will need PEG Pain/Anxiety/Delirium protocol (if indicated): prn meds  VAP protocol (if indicated): ordered DVT prophylaxis: heparin SQ GI prophylaxis: famotidine Glucose control: SSI, Lantus Mobility: Bed Code Status: Full Family Communication: No family at bedside 9/15 am Disposition: ICU  The patient is critically ill with multiple organ systems failure and requires high complexity decision making for assessment and support, frequent evaluation and titration of therapies, application of advanced monitoring technologies and extensive interpretation of multiple databases.   Critical Care Time devoted to patient care services described in this note is  32  Minutes. This time reflects time of care of this signee Dr Jennet Maduro. This critical care time does not reflect procedure time, or teaching time or supervisory time of PA/NP/Med student/Med Resident etc but could involve care discussion time.  Rush Farmer, M.D. [facility] Pulmonary/Critical Care Medicine. Pager: [phone]. After hours pager: [phone].

## 2019-08-23 NOTE — Progress Notes (Signed)
PROGRESS NOTE    Larry Banks  EQA:[id number] DOB: 1971-01-30 DOA: 07/25/2019 PCP: Curlene Labrum, MD   Brief Narrative:  48 year old with history of essential hypertension, peripheral arterial disease, hyperlipidemia, CKD stage V developed shortness of breath at home came to any pain hospital.  Usually follows with [facility] and recently was transitioned to peritoneal dialysis.  Upon arrival to the ER he went into cardiac arrest with asystole requiring CPR.  Transferred to ICU after ROSC 15 minutes later.  Initially required pressors.  HD catheter placed 8/26.  CT of the head was negative.  CT of the abdomen pelvis initially showed bilateral lower lobes pleural effusions nonobstructive renal stone.  Echocardiogram showed ejection fraction 45-50% with hypokinesis.  EEG showed profound slowing of brain activity but no seizure.  CT of the head repeated on 9/1 showed severe diffuse brain edema with widespread hypoxic injury.  Repeat EEG 9/2 showed profound diffuse encephalopathy secondary to hypoxic brain injury.  MRI brain 9/3 showed hypoxic diffuse brain injury.  Most of the cultures remain negative except sputum culture showed above-noted gram-negative rods.  Initially has received Unasyn, cefepime and vancomycin but currently off antibiotics.  Intubated from 8/29 to 9/10.  Having recurrent seizures   Assessment & Plan:   Active Problems:   Cardiac arrest ([place])   Encounter for central line placement   Acute respiratory failure (HCC)   Ventilator dependence ([place])   ESRD needing dialysis ([place])   Anoxic brain damage (HCC)   Seizures (HCC)   PEA cardiac arrest Encephalopathy secondary to anoxic brain injury Recurrent focal seizures, increasing frequency -CT of the head/MRI and EEG are consistent with anoxic brain injury.  Neurology team following. - Supportive care.  Status post tracheostomy -Echocardiogram-ejection fraction 45-50% - Seizure medicines per neuro team.   Option for burst suppression being discussed with family by their team. -Until seizure under control, keep him n.p.o.  Acute hypoxic respiratory failure secondary to mucous plugging -Status post bronchoscopy overnight on 9/19.  Improved this morning.  Moderate protein calorie malnutrition Dysphagia due to encephalopathy -Plans for PEG tube next week once seizures are better  ESRD on hemodialysis -Nephrology following.  Ongoing dialysis  Anemia of chronic disease/iron deficiency -Apparently developed angioedema reaction secondary to iron infusion.  We will hold off on this.  Monitor hemoglobin  Diabetes mellitus type 2, insulin-dependent, relatively stable -Resume home meds.  Insulin sliding scale Accu-Chek -Lantus 30 units daily -NovoLog 3 units every 4 hours  Essential hypertension with sinus tachycardia -On amlodipine and metoprolol.  Very poor prognosis.  Wife wants aggressive measures.  If neurology determines there is no further treatment option available from their standpoint, we will get ethics involved.  DVT prophylaxis: Subcu heparin Code Status: Full code Family Communication: None at bedside disposition Plan: To be determined   Consultants:   Nephrology  General surgery  [name]  Antimicrobials:  Unasyn 8/29 >9/1 Cefepime 9/5> 9/11 vanc 9/5 > 9/9   Subjective: Still having frequent seizures lasting short.  He received multiple doses of IV Ativan. Acutely became hypoxic overnight requiring bronchoscopy due to significant amount of mucus plugging.  Review of Systems Otherwise negative except as per HPI, including: Unable to obtain Objective: Vitals:   08/23/19 1100 08/23/19 1111 08/23/19 1115 08/23/19 1128  BP: 105/83 105/83 109/89   Pulse: (!) 103 100 (!) 102   Resp:  (!) 22    Temp:    98.3 F (36.8 C)  TempSrc:    Oral  SpO2:  100%    Weight:      Height:        Intake/Output Summary (Last 24 hours) at 08/23/2019 1136 Last data filed at  08/23/2019 1000 Gross per 24 hour  Intake 329.99 ml  Output 401 ml  Net -71.01 ml   Filed Weights   08/22/19 0500 08/22/19 2112 08/23/19 0410  Weight: 76.9 kg 76.8 kg 79 kg    Examination:  Constitutional: Unresponsive, at core track in place Eyes: Eyes are closed ENMT: Unable to fully assess Neck: normal, supple, no masses, no thyromegaly Respiratory: Rhonchorous breath sounds anteriorly Cardiovascular: Regular rate and rhythm, no murmurs / rubs / gallops. No extremity edema. 2+ pedal pulses. No carotid bruits.  Abdomen: nondistended abdomen Musculoskeletal: no clubbing / cyanosis. No joint deformity upper and lower extremities. Good ROM, no contractures. Normal muscle tone.  Skin: no rashes, lesions, ulcers. No induration Neurologic: Unable to assess Psychiatric: Unable to assess   Condom catheter in place Tracheostomy Right chest wall hemodialysis catheter   Data Reviewed:   CBC: Recent Labs  Lab 08/18/19 0256 08/19/19 0220 08/16/2019 0239 08/24/2019 0219 08/22/19 2338 08/23/19 0544 08/23/19 0940  WBC 13.8* 11.5* 11.4* 11.6*  --  12.7*  --   HGB 9.7* 10.6* 10.1* 10.6* 10.2* 9.3* 10.5*  HCT 30.0* 34.2* 32.4* 35.0* 30.0* 28.9* 31.0*  MCV 89.3 90.2 90.0 91.1  --  90.3  --   PLT 458* 481* 456* 401*  --  377  --    Basic Metabolic Panel: Recent Labs  Lab 08/18/19 0256  08/19/19 0220 09/02/2019 0537 08/06/2019 0219 08/22/19 0219 08/22/19 2338 08/23/19 0544 08/23/19 0940  NA 137  --  136  136 135 134*  135 134* 128* 133* 135  K 4.8  --  4.4  4.4 5.4* 5.3*  5.3* 6.2* 7.4* >7.5* 4.4  CL 92*  --  92*  92* 90* 92*  93* 89*  --  88*  --   CO2 19*  --  22  21* 21* 21*  22 15*  --  13*  --   GLUCOSE 180*  --  191*  191* 110* 117*  120* 74  --  112*  --   BUN 201*   < > 109*  104* 179* 91*  90* 148*  --  195*  --   CREATININE 11.54*  --  6.79*  6.70* 10.11* 6.93*  6.85* 9.74*  --  13.02*  --   CALCIUM 9.4  --  9.7  9.6 9.5 9.3  9.4 9.4  --  8.6*  --   MG   --   --  2.8*  --   --   --   --   --   --   PHOS 10.8*  --  6.3* 9.6* 8.2*  --   --  >30.0*  --    < > = values in this interval not displayed.   GFR: Estimated Creatinine Clearance: 6.8 mL/min (A) (by C-G formula based on SCr of 13.02 mg/dL (H)). Liver Function Tests: Recent Labs  Lab 08/18/19 0256 08/19/19 0220 08/08/2019 0537 08/06/2019 0219 08/23/19 0544  ALBUMIN 2.8* 3.2* 3.1* 3.1* 3.0*   No results for input(s): LIPASE, AMYLASE in the last 168 hours. No results for input(s): AMMONIA in the last 168 hours. Coagulation Profile: No results for input(s): INR, PROTIME in the last 168 hours. Cardiac Enzymes: No results for input(s): CKTOTAL, CKMB, CKMBINDEX, TROPONINI in the last 168 hours. BNP (last 3 results)  No results for input(s): PROBNP in the last 8760 hours. HbA1C: No results for input(s): HGBA1C in the last 72 hours. CBG: Recent Labs  Lab 08/22/19 2122 08/23/19 0019 08/23/19 0309 08/23/19 0740 08/23/19 1127  GLUCAP 74 98 120* 96 96   Lipid Profile: No results for input(s): CHOL, HDL, LDLCALC, TRIG, CHOLHDL, LDLDIRECT in the last 72 hours. Thyroid Function Tests: No results for input(s): TSH, T4TOTAL, FREET4, T3FREE, THYROIDAB in the last 72 hours. Anemia Panel: No results for input(s): VITAMINB12, FOLATE, FERRITIN, TIBC, IRON, RETICCTPCT in the last 72 hours. Sepsis Labs: Recent Labs  Lab 08/17/19 0751  PROCALCITON 7.29    Recent Results (from the past 240 hour(s))  Expectorated sputum assessment w rflx to resp cult     Status: None   Collection Time: 08/16/19  3:29 PM   Specimen: Tracheal Aspirate; Sputum  Result Value Ref Range Status   Specimen Description TRACHEAL ASPIRATE  Final   Special Requests NONE  Final   Sputum evaluation   Final    THIS SPECIMEN IS ACCEPTABLE FOR SPUTUM CULTURE Performed at [facility], [address] [place], [place] [address]    Report Status 08/16/2019 FINAL  Final  Culture, respiratory     Status: None    Collection Time: 08/16/19  3:29 PM   Specimen: Tracheal Aspirate  Result Value Ref Range Status   Specimen Description TRACHEAL ASPIRATE  Final   Special Requests NONE Reflexed from [id number]  Final   Gram Stain   Final    ABUNDANT SQUAMOUS EPITHELIAL CELLS PRESENT ABUNDANT WBC PRESENT, PREDOMINANTLY PMN ABUNDANT GRAM NEGATIVE RODS RARE GRAM POSITIVE COCCI    Culture   Final    FEW Consistent with normal respiratory flora. Performed at [facility], [place] [address], [place], [place] [address]    Report Status 08/19/2019 FINAL  Final  Culture, blood (routine x 2)     Status: None   Collection Time: 08/16/19  4:28 PM   Specimen: BLOOD  Result Value Ref Range Status   Specimen Description BLOOD LEFT ANTECUBITAL  Final   Special Requests AEROBIC BOTTLE ONLY Blood Culture adequate volume  Final   Culture   Final    NO GROWTH 5 DAYS Performed at [facility] [address] [place], [place] [address]    Report Status 08/17/2019 FINAL  Final  Culture, blood (routine x 2)     Status: None   Collection Time: 08/16/19  4:39 PM   Specimen: BLOOD  Result Value Ref Range Status   Specimen Description BLOOD BLOOD RIGHT HAND  Final   Special Requests AEROBIC BOTTLE ONLY Blood Culture adequate volume  Final   Culture   Final    NO GROWTH 5 DAYS Performed at [facility], [place] [address] [place], [place] [address]    Report Status 08/11/2019 FINAL  Final  Culture, respiratory (non-expectorated)     Status: None (Preliminary result)   Collection Time: 08/22/19 11:27 PM   Specimen: Tracheal Aspirate; Respiratory  Result Value Ref Range Status   Specimen Description TRACHEAL ASPIRATE  Final   Special Requests NONE  Final   Gram Stain   Final    FEW SQUAMOUS EPITHELIAL CELLS PRESENT MODERATE WBC PRESENT, PREDOMINANTLY PMN FEW GRAM POSITIVE COCCI MODERATE FEW GRAM POSITIVE RODS RARE GRAM NEGATIVE RODS Performed at [facility], [place] [address] [place], [place]  [address]    Culture PENDING  Incomplete   Report Status PENDING  Incomplete         Radiology Studies: Dg Chest Port 1 View  Result Date: 08/22/2019 CLINICAL DATA:  Respiratory failure with hypoxia EXAM: PORTABLE CHEST 1 VIEW COMPARISON:  08/19/2019, 08/16/2019 FINDINGS: Tracheostomy tube remains in place. Esophageal tube tip is below the diaphragm but non included. Right-sided central venous catheter tip over the right atrium. Interval diffuse opacity/white out of left thorax. Shift of mediastinal contents to the left, compatible with component of volume loss. Cardiomediastinal silhouette is obscured. Aortic atherosclerosis. No pneumothorax. IMPRESSION: 1. Interim development of diffuse white out/opacity of the left thorax. Mediastinal shift to the left suggests at least some component of volume loss and atelectasis. Electronically Signed   By: Donavan Foil M.D.   On: 08/22/2019 23:14        Scheduled Meds: . amLODipine  5 mg Per Tube Daily  . calcium acetate (Phos Binder)  1,334 mg Per Tube TID WC  . chlorhexidine gluconate (MEDLINE KIT)  15 mL Mouth Rinse BID  . Chlorhexidine Gluconate Cloth  6 each Topical Daily  . clonazepam  2 mg Per Tube TID  . darbepoetin (ARANESP) injection - DIALYSIS  60 mcg Intravenous Q Tue-HD  . famotidine  10 mg Per Tube BID  . fentaNYL (SUBLIMAZE) injection  200 mcg Intravenous Once  . insulin aspart  0-20 Units Subcutaneous Q4H  . insulin aspart  3 Units Subcutaneous Q4H  . insulin glargine  30 Units Subcutaneous Daily  . mouth rinse  15 mL Mouth Rinse 10 times per day  . metoprolol tartrate  12.5 mg Per NG tube BID  . sodium chloride flush  3 mL Intravenous Q12H   Continuous Infusions: . sodium chloride 250 mL (08/16/19 1736)  . sodium chloride    . sodium chloride    . sodium chloride    . sodium chloride    . sodium chloride 10 mL/hr at 08/22/19 0700  . sodium chloride    . sodium chloride    . levETIRAcetam Stopped (08/23/19 0711)  .  valproate sodium Stopped (08/23/19 0916)     LOS: 22 days   Time spent= 35 mins    Rayvon Dakin Arsenio Loader, MD [facility]  If 7PM-7AM, please contact night-coverage [email or web address] 08/23/2019, 11:36 AM

## 2019-08-23 NOTE — Progress Notes (Signed)
Per wife, post conversation with neurologist, continue to use current anti seizure medications to treat seizure activitiy. She does not want to add any burst suppression at this time.  She tells this RN that she thinks that the seizures will pass and that she knows others who have had this same situation and have recovered. She asks that we continue to do what we can for her husband. Wife given emotional support.

## 2019-08-23 NOTE — Progress Notes (Addendum)
Hypertensive versus inaccurate cuff measurement. Antihypertensives given. HD initiated, will monitor before giving additional IV beta blocker as patient has been soft sbp 80's all night MD aware.  Seizing. Unable to get accurate BP on any extremity, remains high. Ativan given. BP now running 90/40's. Seizure has stopped for now.

## 2019-08-23 NOTE — Progress Notes (Signed)
Palliative:  HPI:48 y.o.malewith past medical history of PAD, HLD, ESRD began HD 2 weeks prior to admission with plan to transition to peritoneal dialysis, peritoneal catheter placed 8/27admitted on 8/29/2020with SOB and PEA arrest en-route to ED with ROSC after 15 min down.Continues vent dependent and tolerating HD. Hospitalization complicated further by fevers/sepsis and angioedema of unknown etiology. Unfortunately MRI brain and neurological assessment concerning for extensiveanoxicbrain injury with poor prognosis for significantneurologicalrecovery.Trach placed 9/10.Wean to trach collar 9/14. Plans for PEG placement in near future. 9/18 Plans for PEG have been cancelled for now as he has had ongoing issues with seizure activity since 9/17. He has since continued with concern for ongoing seizure activity (currently stable at this moment). He has since moved out of ICU only to return with needs for bronchoscopy and back on vent.   Larry Banks has declined since a couple days ago. Concern for ongoing seizures and back on vent. No family at bedside. I spoke with RN who notes that wife was here earlier but clearly was not interested in speaking with anyone. She did speak with neurology. I believe that wife is avoiding and frustrated because she truly believes that he will eventually improve and is trying to hang onto this belief.   I did not call her today given her avoidant behaviors and she was clear with RN that she has no need to speak with anyone and questioned why the doctors want to keep talking to her. Of note, she has shared with me that she is part of a facebook support group for "anoxic brain injury caregivers." She sees the improvements in these patients and compares this to her husband although I reminded her that there are drastically varying degrees of brain injury and her husband's is very severe.   At this stage I do not feel that wife will desire any changes in Larry Banks outside of full  code, full scope anytime in the near future.   Exam: Spontaneous eye opening to verbal stimuli but no tracking. No purposeful movements. Full body "twitching" witnessed earlier today. ?Seizure activity. Back on vent via trach.   Plan: - Continue full aggressive care.  - Palliative to follow peripherally now for opportunity to re-engage wife in [place] conversations.   [place], NP Palliative Medicine Team Pager [phone] (Please see [email or web address] for schedule) Team Phone [phone]    Greater than 50%  of this time was spent counseling and coordinating care related to the above assessment and plan

## 2019-08-24 DIAGNOSIS — Z515 Encounter for palliative care: Secondary | ICD-10-CM

## 2019-08-24 DIAGNOSIS — Z7189 Other specified counseling: Secondary | ICD-10-CM

## 2019-08-24 LAB — GLUCOSE, CAPILLARY
Glucose-Capillary: 113 mg/dL — ABNORMAL HIGH (ref 70–99)
Glucose-Capillary: 78 mg/dL (ref 70–99)
Glucose-Capillary: 80 mg/dL (ref 70–99)
Glucose-Capillary: 82 mg/dL (ref 70–99)
Glucose-Capillary: 86 mg/dL (ref 70–99)
Glucose-Capillary: 92 mg/dL (ref 70–99)
Glucose-Capillary: 98 mg/dL (ref 70–99)

## 2019-08-24 LAB — VALPROIC ACID LEVEL: Valproic Acid Lvl: 86 ug/mL (ref 50.0–100.0)

## 2019-08-24 LAB — BASIC METABOLIC PANEL
Anion gap: 23 — ABNORMAL HIGH (ref 5–15)
BUN: 85 mg/dL — ABNORMAL HIGH (ref 6–20)
CO2: 20 mmol/L — ABNORMAL LOW (ref 22–32)
Calcium: 8.3 mg/dL — ABNORMAL LOW (ref 8.9–10.3)
Chloride: 93 mmol/L — ABNORMAL LOW (ref 98–111)
Creatinine, Ser: 7.36 mg/dL — ABNORMAL HIGH (ref 0.61–1.24)
GFR calc Af Amer: 9 mL/min — ABNORMAL LOW (ref >60)
GFR calc non Af Amer: 8 mL/min — ABNORMAL LOW (ref >60)
Glucose, Bld: 104 mg/dL — ABNORMAL HIGH (ref 70–99)
Potassium: 5.2 mmol/L — ABNORMAL HIGH (ref 3.5–5.1)
Sodium: 136 mmol/L (ref 135–145)

## 2019-08-24 MED ORDER — SODIUM CHLORIDE 0.9 % IV SOLN
200.0000 mg | Freq: Two times a day (BID) | INTRAVENOUS | Status: DC
  Administered 2019-08-24 – 2019-08-25 (×3): 200 mg via INTRAVENOUS
  Filled 2019-08-24 (×5): qty 20

## 2019-08-24 MED ORDER — SODIUM CHLORIDE 0.9 % IV SOLN
100.0000 mg | Freq: Once | INTRAVENOUS | Status: AC
  Filled 2019-08-24: qty 10

## 2019-08-24 MED ORDER — CLONAZEPAM 0.5 MG PO TBDP
3.0000 mg | ORAL_TABLET | Freq: Three times a day (TID) | ORAL | Status: DC
  Administered 2019-08-24 – 2019-08-25 (×5): 3 mg
  Filled 2019-08-24 (×5): qty 6

## 2019-08-24 MED ORDER — VALPROATE SODIUM 500 MG/5ML IV SOLN
750.0000 mg | Freq: Two times a day (BID) | INTRAVENOUS | Status: DC
  Administered 2019-08-24 – 2019-08-25 (×3): 750 mg via INTRAVENOUS
  Filled 2019-08-24 (×5): qty 7.5

## 2019-08-24 MED ORDER — VALPROATE SODIUM 500 MG/5ML IV SOLN
1000.0000 mg | Freq: Every day | INTRAVENOUS | Status: DC
  Administered 2019-08-24 – 2019-08-25 (×2): 1000 mg via INTRAVENOUS
  Filled 2019-08-24 (×3): qty 10

## 2019-08-24 NOTE — Progress Notes (Signed)
PROGRESS NOTE    Larry Banks  GDJ:[id number] DOB: Aug 29, 1971 DOA: 07/29/2019 PCP: Curlene Labrum, MD   Brief Narrative:  48 year old with history of essential hypertension, peripheral arterial disease, hyperlipidemia, CKD stage V developed shortness of breath at home came to any pain hospital.  Usually follows with [facility] and recently was transitioned to peritoneal dialysis.  Upon arrival to the ER he went into cardiac arrest with asystole requiring CPR.  Transferred to ICU after ROSC 15 minutes later.  Initially required pressors.  HD catheter placed 8/26.  CT of the head was negative.  CT of the abdomen pelvis initially showed bilateral lower lobes pleural effusions nonobstructive renal stone.  Echocardiogram showed ejection fraction 45-50% with hypokinesis.  EEG showed profound slowing of brain activity but no seizure.  CT of the head repeated on 9/1 showed severe diffuse brain edema with widespread hypoxic injury.  Repeat EEG 9/2 showed profound diffuse encephalopathy secondary to hypoxic brain injury.  MRI brain 9/3 showed hypoxic diffuse brain injury.  Most of the cultures remain negative except sputum culture showed above-noted gram-negative rods.  Initially has received Unasyn, cefepime and vancomycin but currently off antibiotics.  Intubated from 8/29 to 9/10.  Having recurrent seizures secondary to anoxic brain injury.  Difficult to control.   Assessment & Plan:   Active Problems:   Cardiac arrest ([place])   Encounter for central line placement   Acute respiratory failure ([place])   Ventilator dependence ([place])   ESRD needing dialysis ([place])   Anoxic brain damage ([facility])   Seizures ([place])   Acute pulmonary edema ([facility])   Tracheostomy status ([facility])   PEA cardiac arrest Encephalopathy secondary to anoxic brain injury Recurrent focal seizures, increasing frequency -CT of the head/MRI and EEG are consistent with anoxic brain injury.  Neurology team following. -  Supportive care.  Status post tracheostomy -Echocardiogram-ejection fraction 45-50% - Seizure medicines per neuro team.  At this time family does not want burst suppression. -Ethics committee notified  Acute hypoxic respiratory failure secondary to mucous plugging, stable for now -Status post bronchoscopy overnight on 9/19.    Moderate protein calorie malnutrition Dysphagia due to encephalopathy -Hold off on PEG tube at this time given ongoing seizures  ESRD on hemodialysis -Nephrology team following  Anemia of chronic disease/iron deficiency -Apparently developed angioedema reaction secondary to iron infusion.  We will hold off on this.  Monitor hemoglobin  Diabetes mellitus type 2, insulin-dependent, relatively stable -Resume home meds.  Insulin sliding scale Accu-Chek -Lantus 30 units daily -NovoLog 3 units every 4 hours  Essential hypertension with sinus tachycardia -On amlodipine and metoprolol.  At this point there is no scope of recovery given extent of anoxic brain injury resulting in recurrent seizures.  Any further treatment is considered futile.  From my previous discussions with the patient's spouse, she is remained optimistic.  Very poor prognosis.  Documentation reviewed by critical care physician regarding his conversation with wife this morning.  DVT prophylaxis: Subcu heparin Code Status: Full code Family Communication: None at bedside disposition Plan: Ethics committee to get involved to help with disposition planning.   Consultants:   Nephrology  General surgery  [name]  Antimicrobials:  Unasyn 8/29 >9/1 Cefepime 9/5> 9/11 vanc 9/5 > 9/9   Subjective: Continues to have frequent seizures.  Review of Systems Otherwise negative except as per HPI, including: Unable to obtain Objective: Vitals:   08/24/19 0800 08/24/19 0900 08/24/19 1116 08/24/19 1120  BP: 102/87 105/85    Pulse:  100   86  Resp: '14 17  14  '$ Temp:   98.2 F (36.8 C)    TempSrc:   Oral   SpO2: 100%   100%  Weight:      Height:        Intake/Output Summary (Last 24 hours) at 08/24/2019 1124 Last data filed at 08/24/2019 0900 Gross per 24 hour  Intake 774.99 ml  Output 951 ml  Net -176.01 ml   Filed Weights   08/22/19 2112 08/23/19 0410 08/24/19 0600  Weight: 76.8 kg 79 kg 78.2 kg    Examination:  Constitutional: Unresponsive to any stimuli.  NG tube currently in place Respiratory- anterior breath sounds clear to auscultation bilaterally Cardiovascular- normal sinus rhythm Abdomen-nontender nondistended.  Positive bowel sounds Neurologic- no meaningful interaction.  Remains unresponsive  Condom catheter in place Tracheostomy Right chest wall hemodialysis catheter   Data Reviewed:   CBC: Recent Labs  Lab 08/18/19 0256 08/19/19 0220 08/19/2019 0239 08/13/2019 0219 08/22/19 2338 08/23/19 0544 08/23/19 0940  WBC 13.8* 11.5* 11.4* 11.6*  --  12.7*  --   HGB 9.7* 10.6* 10.1* 10.6* 10.2* 9.3* 10.5*  HCT 30.0* 34.2* 32.4* 35.0* 30.0* 28.9* 31.0*  MCV 89.3 90.2 90.0 91.1  --  90.3  --   PLT 458* 481* 456* 401*  --  377  --    Basic Metabolic Panel: Recent Labs  Lab 08/18/19 0256  08/19/19 0220 08/19/2019 0537 08/19/2019 0219 08/22/19 0219 08/22/19 2338 08/23/19 0544 08/23/19 0940 08/24/19 0946  NA 137  --  136  136 135 134*  135 134* 128* 133* 135 136  K 4.8  --  4.4  4.4 5.4* 5.3*  5.3* 6.2* 7.4* >7.5* 4.4 5.2*  CL 92*  --  92*  92* 90* 92*  93* 89*  --  88*  --  93*  CO2 19*  --  22  21* 21* 21*  22 15*  --  13*  --  20*  GLUCOSE 180*  --  191*  191* 110* 117*  120* 74  --  112*  --  104*  BUN 201*   < > 109*  104* 179* 91*  90* 148*  --  195*  --  85*  CREATININE 11.54*  --  6.79*  6.70* 10.11* 6.93*  6.85* 9.74*  --  13.02*  --  7.36*  CALCIUM 9.4  --  9.7  9.6 9.5 9.3  9.4 9.4  --  8.6*  --  8.3*  MG  --   --  2.8*  --   --   --   --   --   --   --   PHOS 10.8*  --  6.3* 9.6* 8.2*  --   --  >30.0*  --   --    <  > = values in this interval not displayed.   GFR: Estimated Creatinine Clearance: 12 mL/min (A) (by C-G formula based on SCr of 7.36 mg/dL (H)). Liver Function Tests: Recent Labs  Lab 08/18/19 0256 08/19/19 0220 08/27/2019 0537 08/08/2019 0219 08/23/19 0544  ALBUMIN 2.8* 3.2* 3.1* 3.1* 3.0*   No results for input(s): LIPASE, AMYLASE in the last 168 hours. No results for input(s): AMMONIA in the last 168 hours. Coagulation Profile: No results for input(s): INR, PROTIME in the last 168 hours. Cardiac Enzymes: No results for input(s): CKTOTAL, CKMB, CKMBINDEX, TROPONINI in the last 168 hours. BNP (last 3 results) No results for input(s): PROBNP in the last 8760 hours.  HbA1C: No results for input(s): HGBA1C in the last 72 hours. CBG: Recent Labs  Lab 08/23/19 1954 08/24/19 0020 08/24/19 0407 08/24/19 0729 08/24/19 1117  GLUCAP 119* 98 80 82 92   Lipid Profile: No results for input(s): CHOL, HDL, LDLCALC, TRIG, CHOLHDL, LDLDIRECT in the last 72 hours. Thyroid Function Tests: No results for input(s): TSH, T4TOTAL, FREET4, T3FREE, THYROIDAB in the last 72 hours. Anemia Panel: No results for input(s): VITAMINB12, FOLATE, FERRITIN, TIBC, IRON, RETICCTPCT in the last 72 hours. Sepsis Labs: No results for input(s): PROCALCITON, LATICACIDVEN in the last 168 hours.  Recent Results (from the past 240 hour(s))  Expectorated sputum assessment w rflx to resp cult     Status: None   Collection Time: 08/16/19  3:29 PM   Specimen: Tracheal Aspirate; Sputum  Result Value Ref Range Status   Specimen Description TRACHEAL ASPIRATE  Final   Special Requests NONE  Final   Sputum evaluation   Final    THIS SPECIMEN IS ACCEPTABLE FOR SPUTUM CULTURE Performed at [facility], [address] [place], [place] [address]    Report Status 08/16/2019 FINAL  Final  Culture, respiratory     Status: None   Collection Time: 08/16/19  3:29 PM   Specimen: Tracheal Aspirate  Result Value Ref Range  Status   Specimen Description TRACHEAL ASPIRATE  Final   Special Requests NONE Reflexed from [id number]  Final   Gram Stain   Final    ABUNDANT SQUAMOUS EPITHELIAL CELLS PRESENT ABUNDANT WBC PRESENT, PREDOMINANTLY PMN ABUNDANT GRAM NEGATIVE RODS RARE GRAM POSITIVE COCCI    Culture   Final    FEW Consistent with normal respiratory flora. Performed at [facility], [place] [address] [place], [place] [address]    Report Status 08/19/2019 FINAL  Final  Culture, blood (routine x 2)     Status: None   Collection Time: 08/16/19  4:28 PM   Specimen: BLOOD  Result Value Ref Range Status   Specimen Description BLOOD LEFT ANTECUBITAL  Final   Special Requests AEROBIC BOTTLE ONLY Blood Culture adequate volume  Final   Culture   Final    NO GROWTH 5 DAYS Performed at [place] [address], [place], [place] [address]    Report Status 08/22/2019 FINAL  Final  Culture, blood (routine x 2)     Status: None   Collection Time: 08/16/19  4:39 PM   Specimen: BLOOD  Result Value Ref Range Status   Specimen Description BLOOD BLOOD RIGHT HAND  Final   Special Requests AEROBIC BOTTLE ONLY Blood Culture adequate volume  Final   Culture   Final    NO GROWTH 5 DAYS Performed at [facility], [place] [address], [place], [place] [address]    Report Status 08/25/2019 FINAL  Final  Culture, respiratory (non-expectorated)     Status: None (Preliminary result)   Collection Time: 08/22/19 11:27 PM   Specimen: Tracheal Aspirate; Respiratory  Result Value Ref Range Status   Specimen Description TRACHEAL ASPIRATE  Final   Special Requests NONE  Final   Gram Stain   Final    FEW SQUAMOUS EPITHELIAL CELLS PRESENT MODERATE WBC PRESENT, PREDOMINANTLY PMN FEW GRAM POSITIVE COCCI MODERATE FEW GRAM POSITIVE RODS RARE GRAM NEGATIVE RODS Performed at [facility], [place] [address], [place], [place] [address]    Culture PENDING  Incomplete   Report Status PENDING  Incomplete          Radiology  Studies: Dg Chest Port 1 View  Result Date: 08/22/2019 CLINICAL DATA:  Respiratory failure with hypoxia EXAM: PORTABLE CHEST 1 VIEW COMPARISON:  08/19/2019, 08/16/2019 FINDINGS: Tracheostomy tube remains in place. Esophageal tube tip is below the diaphragm but non included. Right-sided central venous catheter tip over the right atrium. Interval diffuse opacity/white out of left thorax. Shift of mediastinal contents to the left, compatible with component of volume loss. Cardiomediastinal silhouette is obscured. Aortic atherosclerosis. No pneumothorax. IMPRESSION: 1. Interim development of diffuse white out/opacity of the left thorax. Mediastinal shift to the left suggests at least some component of volume loss and atelectasis. Electronically Signed   By: Donavan Foil M.D.   On: 08/22/2019 23:14        Scheduled Meds: . amLODipine  5 mg Per Tube Daily  . calcium acetate (Phos Binder)  1,334 mg Per Tube TID WC  . chlorhexidine gluconate (MEDLINE KIT)  15 mL Mouth Rinse BID  . Chlorhexidine Gluconate Cloth  6 each Topical Daily  . clonazepam  2 mg Per Tube TID  . darbepoetin (ARANESP) injection - DIALYSIS  60 mcg Intravenous Q Tue-HD  . famotidine  10 mg Per Tube BID  . fentaNYL (SUBLIMAZE) injection  200 mcg Intravenous Once  . insulin aspart  0-20 Units Subcutaneous Q4H  . insulin aspart  3 Units Subcutaneous Q4H  . insulin glargine  30 Units Subcutaneous Daily  . mouth rinse  15 mL Mouth Rinse 10 times per day  . metoprolol tartrate  12.5 mg Per NG tube BID  . sodium chloride flush  3 mL Intravenous Q12H   Continuous Infusions: . sodium chloride 250 mL (08/16/19 1736)  . sodium chloride    . sodium chloride    . sodium chloride    . sodium chloride    . sodium chloride 10 mL/hr at 08/24/19 0900  . sodium chloride    . sodium chloride    . lacosamide (VIMPAT) IV 100 mg (08/24/19 1032)  . levETIRAcetam Stopped (08/24/19 0626)  . valproate sodium Stopped (08/24/19 0734)      LOS: 23 days   Time spent= 35 mins     Arsenio Loader, MD [facility]  If 7PM-7AM, please contact night-coverage [email or web address] 08/24/2019, 11:24 AM

## 2019-08-24 NOTE — Progress Notes (Signed)
Patient ID: Larry Banks, male   DOB: [birth year], 47 y.o.   MRN: [id number]  This NP visited patient at the bedside as a follow up for palliative medicine  needs and emotional support.  Larry Banks CM-RN requested palliative visit in response to wife talking about "taking him home if they are not going to do anything for him"  I met at the bedside with wife/Larry Banks and her friend/Larry Banks for continued conversation regarding current medical situation.  We discussed the patient's diffuse ischemic brain injury and post anoxic seizures.  We discussed his poor prognosis  We discussed human mortality and the limitations of medical interventions to prolong quality of life when the body fails to thrive.  Patient's wife is hearing from the medical providers that there are pending decisions regarding aggressiveness of medical interventions, and the importance of putting boundaries around escalation of care.  We discussed the consideration of quality of life.  Larry Banks is having a difficult time understanding the reality of poor neurological prognosis for meaningful recovery. CCM introduced to the wife that decisions (specidfic to DNR is being consider by physicians)    I detailed the difference between a continued aggressive medical intervention path and  Palliative comfort path.  Discussed possibility of home with hospice. Hard Choices booklet left for review.  Emotional support offered     I attempted to answer questions and concerns.     Wife is in agreement to meet tomorrow at 67 for further discussion.    I encouraged  her to have conversation with her family and support persons.  Questions and concerns addressed   Discussed with Dr Nelda Marseille  Total time spent on the unit was 60 minutes  Greater than 50% of the time was spent in counseling and coordination of care  Larry Lessen NP  Palliative Medicine Team Team Phone # [phone] Pager [phone]

## 2019-08-24 NOTE — Progress Notes (Signed)
Subjective: Patient's wife is at the bedside.   Objective: Current vital signs: BP 105/85   Pulse 86   Temp 98.2 F (36.8 C) (Oral)   Resp 14   Ht '5\' 6"'$  (1.676 m)   Wt 78.2 kg   SpO2 100%   BMI 27.83 kg/m  Vital signs in last 24 hours: Temp:  [97.8 F (36.6 C)-98.6 F (37 C)] 98.2 F (36.8 C) (09/21 1116) Pulse Rate:  [86-106] 86 (09/21 1120) Resp:  [12-22] 14 (09/21 1120) BP: (72-107)/(55-89) 105/85 (09/21 0900) SpO2:  [94 %-100 %] 100 % (09/21 1120) FiO2 (%):  [40 %] 40 % (09/21 1120) Weight:  [78.2 kg] 78.2 kg (09/21 0600)  Intake/Output from previous day: 09/20 0701 - 09/21 0700 In: 936.9 [I.V.:244.7; NG/GT:90; IV Piggyback:602.2] Out: 951  Intake/Output this shift: Total I/O In: 47.5 [I.V.:14.2; IV Piggyback:33.4] Out: 0  Nutritional status:  Diet Order            Diet NPO time specified  Diet effective now             HEENT: [place]/AT Ext: No edema  Neurologic Exam: Mental Status: Eyes closed at baseline. Will open to noxious stimuli. Does not gaze towards or away from visual stimuli. No spontaneous or purposeful movements, with posturing to pain. Will roll head slowly to one side and then the other in response to some stimuli. No grimace to noxious stimuli or other evidence for conscious perception of pain. Not responding to any verbal stimuli. No attempts to communicate.  Cranial Nerves: II:  Pupils 3 mm bilaterally and sluggishly reactive to light. No blink to threat.  III,IV, VI: Eyes conjugately at the midline with minimal oculocephalic reflex. No nystagmus.  V,VII: Face flaccidly symmetric. Weak corneal reflexes bilaterally  VIII: No response to voice.  XI: Head at midline XII: Unable to assess Motor/Sensory: Flaccid tone x 4 without asymmetry BUE with weak flexor and adductor responses to pain.  BLE with weak flexor and adductor responses to pain.  Deep Tendon Reflexes:  1+ bilateral brachioradialis, biceps and patellae bilaterally Plantars:  Upgoing bilaterally  Cerebellar/Gait: Unable to assess   Lab Results: Results for orders placed or performed during the hospital encounter of 07/21/2019 (from the past 48 hour(s))  Glucose, capillary     Status: None   Collection Time: 08/22/19 11:56 AM  Result Value Ref Range   Glucose-Capillary 76 70 - 99 mg/dL  Glucose, capillary     Status: None   Collection Time: 08/22/19  4:15 PM  Result Value Ref Range   Glucose-Capillary 79 70 - 99 mg/dL  Glucose, capillary     Status: None   Collection Time: 08/22/19  7:29 PM  Result Value Ref Range   Glucose-Capillary 71 70 - 99 mg/dL  Glucose, capillary     Status: None   Collection Time: 08/22/19  9:22 PM  Result Value Ref Range   Glucose-Capillary 74 70 - 99 mg/dL   Comment 1 Notify RN   Culture, respiratory (non-expectorated)     Status: None (Preliminary result)   Collection Time: 08/22/19 11:27 PM   Specimen: Tracheal Aspirate; Respiratory  Result Value Ref Range   Specimen Description TRACHEAL ASPIRATE    Special Requests NONE    Gram Stain      FEW SQUAMOUS EPITHELIAL CELLS PRESENT MODERATE WBC PRESENT, PREDOMINANTLY PMN FEW GRAM POSITIVE COCCI MODERATE FEW GRAM POSITIVE RODS RARE GRAM NEGATIVE RODS Performed at [facility], [place] [address], [place], [place] [address]  Culture PENDING    Report Status PENDING   I-STAT 7, (LYTES, BLD GAS, ICA, H+H)     Status: Abnormal   Collection Time: 08/22/19 11:38 PM  Result Value Ref Range   pH, Arterial 7.249 (L) 7.350 - 7.450   pCO2 arterial 38.0 32.0 - 48.0 mmHg   pO2, Arterial 303.0 (H) 83.0 - 108.0 mmHg   Bicarbonate 16.6 (L) 20.0 - 28.0 mmol/L   TCO2 18 (L) 22 - 32 mmol/L   O2 Saturation 100.0 %   Acid-base deficit 10.0 (H) 0.0 - 2.0 mmol/L   Sodium 128 (L) 135 - 145 mmol/L   Potassium 7.4 (HH) 3.5 - 5.1 mmol/L   Calcium, Ion 1.02 (L) 1.15 - 1.40 mmol/L   HCT 30.0 (L) 39.0 - 52.0 %   Hemoglobin 10.2 (L) 13.0 - 17.0 g/dL   Patient temperature 99.0 F     Collection site RADIAL, ALLEN'S TEST ACCEPTABLE    Drawn by RT    Sample type ARTERIAL    Comment NOTIFIED PHYSICIAN   Glucose, capillary     Status: None   Collection Time: 08/23/19 12:19 AM  Result Value Ref Range   Glucose-Capillary 98 70 - 99 mg/dL  Glucose, capillary     Status: Abnormal   Collection Time: 08/23/19  3:09 AM  Result Value Ref Range   Glucose-Capillary 120 (H) 70 - 99 mg/dL  Basic metabolic panel     Status: Abnormal   Collection Time: 08/23/19  5:44 AM  Result Value Ref Range   Sodium 133 (L) 135 - 145 mmol/L   Potassium >7.5 (HH) 3.5 - 5.1 mmol/L    Comment: CRITICAL RESULT CALLED TO, READ BACK BY AND VERIFIED WITH: MCFADDEN,M RN 08/23/2019 0647 JORDANS NO VISIBLE HEMOLYSIS    Chloride 88 (L) 98 - 111 mmol/L   CO2 13 (L) 22 - 32 mmol/L   Glucose, Bld 112 (H) 70 - 99 mg/dL   BUN 195 (H) 6 - 20 mg/dL   Creatinine, Ser 13.02 (H) 0.61 - 1.24 mg/dL   Calcium 8.6 (L) 8.9 - 10.3 mg/dL   GFR calc non Af Amer 4 (L) >60 mL/min   GFR calc Af Amer 5 (L) >60 mL/min   Anion gap 32 (H) 5 - 15    Comment: RESULT CHECKED Performed at [facility], [address] [place], [place] [address]   Valproic acid level     Status: None   Collection Time: 08/23/19  5:44 AM  Result Value Ref Range   Valproic Acid Lvl 70 50.0 - 100.0 ug/mL    Comment: Performed at [facility] [address], [place], [place] [address]  CBC     Status: Abnormal   Collection Time: 08/23/19  5:44 AM  Result Value Ref Range   WBC 12.7 (H) 4.0 - 10.5 K/uL   RBC 3.20 (L) 4.22 - 5.81 MIL/uL   Hemoglobin 9.3 (L) 13.0 - 17.0 g/dL   HCT 28.9 (L) 39.0 - 52.0 %   MCV 90.3 80.0 - 100.0 fL   MCH 29.1 26.0 - 34.0 pg   MCHC 32.2 30.0 - 36.0 g/dL   RDW 14.3 11.5 - 15.5 %   Platelets 377 150 - 400 K/uL   nRBC 0.0 0.0 - 0.2 %    Comment: Performed at [facility], [place] [address] [place], [place] [address]  Albumin     Status: Abnormal   Collection Time: 08/23/19  5:44 AM  Result  Value  Ref Range   Albumin 3.0 (L) 3.5 - 5.0 g/dL    Comment: Performed at [facility], [place] [address], [place], [place] [address]  Phosphorus     Status: Abnormal   Collection Time: 08/23/19  5:44 AM  Result Value Ref Range   Phosphorus >30.0 (H) 2.5 - 4.6 mg/dL    Comment: RESULTS CONFIRMED BY MANUAL DILUTION Performed at [facility], [place] [address], [place], [place] [address]   Glucose, capillary     Status: None   Collection Time: 08/23/19  7:40 AM  Result Value Ref Range   Glucose-Capillary 96 70 - 99 mg/dL  I-STAT 7, (LYTES, BLD GAS, ICA, H+H)     Status: Abnormal   Collection Time: 08/23/19  9:40 AM  Result Value Ref Range   pH, Arterial 7.417 7.350 - 7.450   pCO2 arterial 40.4 32.0 - 48.0 mmHg   pO2, Arterial 32.0 (LL) 83.0 - 108.0 mmHg   Bicarbonate 26.0 20.0 - 28.0 mmol/L   TCO2 27 22 - 32 mmol/L   O2 Saturation 63.0 %   Acid-Base Excess 1.0 0.0 - 2.0 mmol/L   Sodium 135 135 - 145 mmol/L   Potassium 4.4 3.5 - 5.1 mmol/L   Calcium, Ion 1.02 (L) 1.15 - 1.40 mmol/L   HCT 31.0 (L) 39.0 - 52.0 %   Hemoglobin 10.5 (L) 13.0 - 17.0 g/dL   Patient temperature HIDE    Sample type ARTERIAL   Glucose, capillary     Status: None   Collection Time: 08/23/19 11:27 AM  Result Value Ref Range   Glucose-Capillary 96 70 - 99 mg/dL  Glucose, capillary     Status: Abnormal   Collection Time: 08/23/19  4:23 PM  Result Value Ref Range   Glucose-Capillary 133 (H) 70 - 99 mg/dL  Glucose, capillary     Status: Abnormal   Collection Time: 08/23/19  7:54 PM  Result Value Ref Range   Glucose-Capillary 119 (H) 70 - 99 mg/dL  Glucose, capillary     Status: None   Collection Time: 08/24/19 12:20 AM  Result Value Ref Range   Glucose-Capillary 98 70 - 99 mg/dL  Glucose, capillary     Status: None   Collection Time: 08/24/19  4:07 AM  Result Value Ref Range   Glucose-Capillary 80 70 - 99 mg/dL  Glucose, capillary     Status: None   Collection Time: 08/24/19  7:29 AM  Result  Value Ref Range   Glucose-Capillary 82 70 - 99 mg/dL  Basic metabolic panel     Status: Abnormal   Collection Time: 08/24/19  9:46 AM  Result Value Ref Range   Sodium 136 135 - 145 mmol/L   Potassium 5.2 (H) 3.5 - 5.1 mmol/L   Chloride 93 (L) 98 - 111 mmol/L   CO2 20 (L) 22 - 32 mmol/L   Glucose, Bld 104 (H) 70 - 99 mg/dL   BUN 85 (H) 6 - 20 mg/dL   Creatinine, Ser 7.36 (H) 0.61 - 1.24 mg/dL    Comment: DELTA CHECK NOTED DIALYSIS    Calcium 8.3 (L) 8.9 - 10.3 mg/dL   GFR calc non Af Amer 8 (L) >60 mL/min   GFR calc Af Amer 9 (L) >60 mL/min   Anion gap 23 (H) 5 - 15    Comment: Performed at [facility], [place] [address] [place], [place] [address]  Valproic acid level     Status: None   Collection Time: 08/24/19  9:46 AM  Result Value Ref Range   Valproic Acid Lvl 86 50.0 - 100.0 ug/mL    Comment: Performed at [facility] [address], [place], [place] [address]    Recent Results (from the past 240 hour(s))  Expectorated sputum assessment w rflx to resp cult     Status: None   Collection Time: 08/16/19  3:29 PM   Specimen: Tracheal Aspirate; Sputum  Result Value Ref Range Status   Specimen Description TRACHEAL ASPIRATE  Final   Special Requests NONE  Final   Sputum evaluation   Final    THIS SPECIMEN IS ACCEPTABLE FOR SPUTUM CULTURE Performed at [facility], [address] [place], [place] [address]    Report Status 08/16/2019 FINAL  Final  Culture, respiratory     Status: None   Collection Time: 08/16/19  3:29 PM   Specimen: Tracheal Aspirate  Result Value Ref Range Status   Specimen Description TRACHEAL ASPIRATE  Final   Special Requests NONE Reflexed from [id number]  Final   Gram Stain   Final    ABUNDANT SQUAMOUS EPITHELIAL CELLS PRESENT ABUNDANT WBC PRESENT, PREDOMINANTLY PMN ABUNDANT GRAM NEGATIVE RODS RARE GRAM POSITIVE COCCI    Culture   Final    FEW Consistent with normal respiratory flora. Performed at [facility], [place]  [address] [place], [place] [address]    Report Status 08/19/2019 FINAL  Final  Culture, blood (routine x 2)     Status: None   Collection Time: 08/16/19  4:28 PM   Specimen: BLOOD  Result Value Ref Range Status   Specimen Description BLOOD LEFT ANTECUBITAL  Final   Special Requests AEROBIC BOTTLE ONLY Blood Culture adequate volume  Final   Culture   Final    NO GROWTH 5 DAYS Performed at [facility] [address], [place], [place] [address]    Report Status 08/14/2019 FINAL  Final  Culture, blood (routine x 2)     Status: None   Collection Time: 08/16/19  4:39 PM   Specimen: BLOOD  Result Value Ref Range Status   Specimen Description BLOOD BLOOD RIGHT HAND  Final   Special Requests AEROBIC BOTTLE ONLY Blood Culture adequate volume  Final   Culture   Final    NO GROWTH 5 DAYS Performed at [facility], [place] [address], [place], [place] [address]    Report Status 08/14/2019 FINAL  Final  Culture, respiratory (non-expectorated)     Status: None (Preliminary result)   Collection Time: 08/22/19 11:27 PM   Specimen: Tracheal Aspirate; Respiratory  Result Value Ref Range Status   Specimen Description TRACHEAL ASPIRATE  Final   Special Requests NONE  Final   Gram Stain   Final    FEW SQUAMOUS EPITHELIAL CELLS PRESENT MODERATE WBC PRESENT, PREDOMINANTLY PMN FEW GRAM POSITIVE COCCI MODERATE FEW GRAM POSITIVE RODS RARE GRAM NEGATIVE RODS Performed at [facility], [place] [address] [place], [place] [address]    Culture PENDING  Incomplete   Report Status PENDING  Incomplete    Lipid Panel No results for input(s): CHOL, TRIG, HDL, CHOLHDL, VLDL, LDLCALC in the last 72 hours.  Studies/Results: Dg Chest Port 1 View  Result Date: 08/22/2019 CLINICAL DATA:  Respiratory failure with hypoxia EXAM: PORTABLE CHEST 1 VIEW COMPARISON:  08/19/2019, 08/16/2019 FINDINGS: Tracheostomy tube remains in place. Esophageal tube tip is below the diaphragm but non included. Right-sided  central venous catheter tip over the right atrium.  Interval diffuse opacity/white out of left thorax. Shift of mediastinal contents to the left, compatible with component of volume loss. Cardiomediastinal silhouette is obscured. Aortic atherosclerosis. No pneumothorax. IMPRESSION: 1. Interim development of diffuse white out/opacity of the left thorax. Mediastinal shift to the left suggests at least some component of volume loss and atelectasis. Electronically Signed   By: Donavan Foil M.D.   On: 08/22/2019 23:14    Medications:  Scheduled: . amLODipine  5 mg Per Tube Daily  . calcium acetate (Phos Binder)  1,334 mg Per Tube TID WC  . chlorhexidine gluconate (MEDLINE KIT)  15 mL Mouth Rinse BID  . Chlorhexidine Gluconate Cloth  6 each Topical Daily  . clonazepam  2 mg Per Tube TID  . darbepoetin (ARANESP) injection - DIALYSIS  60 mcg Intravenous Q Tue-HD  . famotidine  10 mg Per Tube BID  . fentaNYL (SUBLIMAZE) injection  200 mcg Intravenous Once  . insulin aspart  0-20 Units Subcutaneous Q4H  . insulin aspart  3 Units Subcutaneous Q4H  . insulin glargine  30 Units Subcutaneous Daily  . mouth rinse  15 mL Mouth Rinse 10 times per day  . metoprolol tartrate  12.5 mg Per NG tube BID  . sodium chloride flush  3 mL Intravenous Q12H   Continuous: . sodium chloride 250 mL (08/16/19 1736)  . sodium chloride    . sodium chloride    . sodium chloride    . sodium chloride    . sodium chloride 10 mL/hr at 08/24/19 0900  . sodium chloride    . sodium chloride    . lacosamide (VIMPAT) IV 100 mg (08/24/19 1032)  . levETIRAcetam Stopped (08/24/19 0626)  . valproate sodium Stopped (08/24/19 0734)      Assessment: 48 year old male with diffuse ischemic brain injury and post-anoxic seizures 1. Exam findings localize as severe diffuse dysfunction of the bilateral cerebral hemispheres. Best classifiable as being in a vegetative or minimally conscious state. Prognosis for Neurologically meaningful  recovery is felt to be poor.  2. EEG from today: This study is suggestive of profound diffuse encephalopathy. No seizures or epileptiform discharges were seen throughout the recording 3. MRI brain: Diffuse hypoxic ischemic injury most pronounced affecting the cerebral hemispheric cortex but with some involvement also of the basal ganglia and thalami. 4. No clinical seizure activity on today's exam  Recommendations: 1. Continue to monitor.  2. Discuss goals of care with family.   35 minutes spent in the neurological evaluation and management of this critically ill patient.    LOS: 23 days   '@Electronically'$  signed: Dr. Kerney Elbe 08/24/2019  11:22 AM

## 2019-08-24 NOTE — Progress Notes (Signed)
Patient ID: Larry Banks, male   DOB: 1971/02/04, 48 y.o.   MRN: [id number] Weekend events noted, eeg and neurology notes reviewed.  I will not plan on removing pd catheter at this time to prevent future issues. His risk of that is higher than of any possible infection.  If it is decided to proceed with care at this point, please call back for feeding access via PEG.

## 2019-08-24 NOTE — Progress Notes (Addendum)
[facility] Progress Note    Assessment/ Plan:   1. ESRD THS Via R IJ TDC with [facility].  Azotemia noted, likely from catabolic state/ TF.  HD previously on MWF converted to TTS sched. - I agree that prognosis is very grim and more importantly long term RRT  Not improving QOL. D/w Dr. Nelda Marseille and I agree we should discontinue RRT. - Will hold off on writing HD orders; Dr. Nelda Marseille currently on the phone with the spouse. She was not returning calls but finally called the nurse.  2. Seizures: on Keppra, worsening seizures occurring over the week--> neuro reconsulted --> still having seizures, appreciate their assistance. Burst suppression therapy declined.    3. Anoxic brain injury - continue scope of care with all aggressive interventions per family- EEG with essentially devastating injury, temporal lobe spikes, low likelihood of recovery.  He is unlikely to achieve prior level of functioning and quality of life.  Support ethics c/s if deemed appropriate.  4. S/p PD cath placement 8/27: no longer a PD candidate given anoxic brain injury.  Was going to have PD cath removed.  Appreciate the assistance of gen surg-->Agree with Dr. Pollie Friar note, further procedures/ invasive interventions likely to achieve overall benefit.  Will not pursue at this time esp with increased seizure activity. Agree no need to remove if moving towards palliative.     5. VDRF - back on the vent  6. Hyperphosphatemia on binders  7. Anemia of CKD.  Hgb stable.  Iron low, had angioedema to ferrlicet vs antibiotic.  Listed as allergy, d/w pharmacy. ON aranesp 60 q week  8. Dispo: he has a devastating neurologic injury with poor prognosis and slim hope of functional recovery.  Dialysis is not adding to his QOL and he remains a suboptimal candidate for short or long term dialysis.     Subjective:   Continued seizure activity this AM.   Objective:   BP 105/85   Pulse 100   Temp 98.2 F (36.8 C) (Oral)    Resp 17   Ht _0  (1.676 m)   Wt 78.2 kg   SpO2 100%   BMI 27.83 kg/m   Intake/Output Summary (Last 24 hours) at 08/24/2019 1003 Last data filed at 08/24/2019 0900 Gross per 24 hour  Intake 774.99 ml  Output 951 ml  Net -176.01 ml   Weight change: 1.4 kg  Physical Exam: General: NAD, unresponsive, actively seizing HEENT: eyes closed, trach in place, on vent CV:  Heart RRR Lungs:  Clear anteriorly Abd:  abd SNT/ND with normal BS, some distention but soft Extremities:  Trace LE edema NEURO: stereotypic movements observed, improved with Ativan  ACCESS: RLQ PD cath, R IJ [facility]  Imaging: Dg Chest Port 1 View  Result Date: 08/22/2019 CLINICAL DATA:  Respiratory failure with hypoxia EXAM: PORTABLE CHEST 1 VIEW COMPARISON:  08/19/2019, 08/16/2019 FINDINGS: Tracheostomy tube remains in place. Esophageal tube tip is below the diaphragm but non included. Right-sided central venous catheter tip over the right atrium. Interval diffuse opacity/white out of left thorax. Shift of mediastinal contents to the left, compatible with component of volume loss. Cardiomediastinal silhouette is obscured. Aortic atherosclerosis. No pneumothorax. IMPRESSION: 1. Interim development of diffuse white out/opacity of the left thorax. Mediastinal shift to the left suggests at least some component of volume loss and atelectasis. Electronically Signed   By: Donavan Foil M.D.   On: 08/22/2019 23:14    Labs: BMET Recent Labs  Lab 08/18/19 0256 08/18/19 1208 08/19/19  2952 08/12/2019 0537 08/15/2019 0219 08/22/19 0219 08/22/19 2338 08/23/19 0544 08/23/19 0940  NA 137  --  136  136 135 134*  135 134* 128* 133* 135  K 4.8  --  4.4  4.4 5.4* 5.3*  5.3* 6.2* 7.4* >7.5* 4.4  CL 92*  --  92*  92* 90* 92*  93* 89*  --  88*  --   CO2 19*  --  22  21* 21* 21*  22 15*  --  13*  --   GLUCOSE 180*  --  191*  191* 110* 117*  120* 74  --  112*  --   BUN 201* 60* 109*  104* 179* 91*  90* 148*  --  195*  --    CREATININE 11.54*  --  6.79*  6.70* 10.11* 6.93*  6.85* 9.74*  --  13.02*  --   CALCIUM 9.4  --  9.7  9.6 9.5 9.3  9.4 9.4  --  8.6*  --   PHOS 10.8*  --  6.3* 9.6* 8.2*  --   --  >30.0*  --    CBC Recent Labs  Lab 08/19/19 0220 08/19/2019 0239 08/16/2019 0219 08/22/19 2338 08/23/19 0544 08/23/19 0940  WBC 11.5* 11.4* 11.6*  --  12.7*  --   HGB 10.6* 10.1* 10.6* 10.2* 9.3* 10.5*  HCT 34.2* 32.4* 35.0* 30.0* 28.9* 31.0*  MCV 90.2 90.0 91.1  --  90.3  --   PLT 481* 456* 401*  --  377  --     Medications:    . amLODipine  5 mg Per Tube Daily  . calcium acetate (Phos Binder)  1,334 mg Per Tube TID WC  . chlorhexidine gluconate (MEDLINE KIT)  15 mL Mouth Rinse BID  . Chlorhexidine Gluconate Cloth  6 each Topical Daily  . clonazepam  2 mg Per Tube TID  . darbepoetin (ARANESP) injection - DIALYSIS  60 mcg Intravenous Q Tue-HD  . famotidine  10 mg Per Tube BID  . fentaNYL (SUBLIMAZE) injection  200 mcg Intravenous Once  . insulin aspart  0-20 Units Subcutaneous Q4H  . insulin aspart  3 Units Subcutaneous Q4H  . insulin glargine  30 Units Subcutaneous Daily  . mouth rinse  15 mL Mouth Rinse 10 times per day  . metoprolol tartrate  12.5 mg Per NG tube BID  . sodium chloride flush  3 mL Intravenous Q12H      Otelia Santee, MD 08/24/2019, 10:03 AM

## 2019-08-24 NOTE — Progress Notes (Signed)
[name] Progress Note  I have been asked by Dr. Nelda Marseille to evaluate Larry Banks, give my thoughts regarding his current status, critical care going forward given his neurological prognosis.  Patient is a 48 year old man with hypertension, peripheral vascular disease, hyperlipidemia and CKD stage V.  He was preparing for initiation of peritoneal dialysis and had a PD catheter placed on 8/27.  He unfortunately sustained a cardiac arrest with asystole on 8/29.  Duration of CPR was estimated to be 15 minutes before ROSC.  He developed full renal failure requiring hemodialysis in the setting of the above.  He underwent TTM at [facility], has shown evidence for significant neurological dysfunction since rewarming.  Course complicated by fever, angioedema with enlargement of his tongue which is largely now improved.  He was treated empirically for sepsis with vancomycin and cefepime, now off.  He is also had intermittent seizure activity, rhythmic movements of his left upper extremity.  He was able to transition off mechanical ventilation 9/15, experienced complete left atelectasis, associated hypoxemia and was placed back on MV, moved back to the ICU.  Bronchoscopy was performed with successful suctioning of secretions.  Vitals:   08/24/19 1116 08/24/19 1120 08/24/19 1200 08/24/19 1300  BP:   111/85 120/81  Pulse:  86 83   Resp:  14 14 14   Temp: 98.2 F (36.8 C)     TempSrc: Oral     SpO2:  100% 100%   Weight:      Height:      On my examination he will not wake or interact.  His eyes open with stimulation and he has a rightward upward gaze.  Pupils do react to light.  He has a positive gag reflex, positive cough reflex.  He breathes over the set rate on mechanical ventilator, has tolerated some PSV.  His bilateral lower extremities will withdraw from pain, more so on the left than on the right.  He has symmetrical withdrawal from pain in his bilateral upper extremities.  He does not track, does not follow  commands, does not exhibit any purposeful movement.  Tracheostomy is in place, midline with no significant secretions.  His tongue has improved, no longer significantly enlarged or protruding.  He does have some oral secretions present.  Lungs are for the most part clear.  No wheezing present.  Abdomen is soft, nondistended with positive bowel sounds.  His lower extremities are in foot drop boots with no significant edema.  Most recent MRI brain was 08/06/2019.  This shows widespread hypoxic ischemic injury to the cerebral hemispheric cortex, basal ganglia, thalami.  There is no shift or evidence of herniation.  EEG from 08/29/2019 shows diffuse background suppression with some reactivity to tactile stimulation.  Studies consistent with severe to profound diffuse encephalopathy without any epileptiform discharges or evidence for seizure activity.  Repeat EEG done 9/20 reviewed.  Again this was consistent with profound diffuse encephalopathy.   Neurology evaluation appreciated and reviewed.  As per Dr. Cheral Marker patient with evidence for profound hypoxemic encephalopathy with very poor neurological prognosis for meaningful recovery.  Based on my exam of the patient today, review of the studies, notes during this hospitalization, I agree that the patient has a very poor neurological prognosis.  His wife is his healthcare spokesperson and has advocated extended period of support in order to allow [place] to follow for any evidence of neurological change.  Based on this tracheostomy has been performed, PEG tube has been proposed.  He is currently getting feeds via  an NG tube.  Even if prolonged support is desired and consistent with the patient's wishes, it is clear to me that in the event of any recurrent cardiac arrest, hypoxemic insult, Mr. Mossey chance for meaningful neurological change becomes even worse and I believe impossible.  For this reason I concur that his CODE STATUS should be DNR, no CPR or ACLS should  he suffer cardiac arrest.  I also agree with the ongoing discussions between [name], palliative care services, other consultants including neurology regarding his prognosis and the appropriateness of extended ventilator and critical care support.  Independent CC time 35 minutes  Larry Apo, MD, PhD 08/24/2019, 1:58 PM [facility] [phone] or if no answer [phone]

## 2019-08-24 NOTE — Care Management (Signed)
08-24-19 1320 Late Entry: CM received message that wife had some questions. CM visited the patient's wife around 1115 this am. Wife discussed that the physicians were going to have a meeting discussing plan of care for patient. Wife did ask CM about Hospice at home. CM did suggest that she speak with palliative care again regarding this question. CM did call Wadie Lessen NP with Palliative Care to speak with patient. CM will continue to follow for additional transition of care needs. Bethena Roys, RN,BSN Case Manager [phone]

## 2019-08-24 NOTE — Progress Notes (Signed)
PULMONARY / CRITICAL CARE MEDICINE   NAME:  Larry Banks, MRN:  [id number], DOB:  1970/12/29, LOS: 33 ADMISSION DATE:  07/31/2019, CONSULTATION DATE:  07/08/2019  REFERRING MD:  EDP Dr. Tomi Banks   CHIEF COMPLAINT:  Cardiac Arrest/Resp Failure   BRIEF HISTORY:    48 year old male with medical history significant for hypertension, PAD, hyperlipidemia, CKD Stage 5 developed shortness of breath at home.  Patient does have CKD Stage 5 .  Is followed at [facility].  Recently had peritoneal dialysis catheter placed on August 27. Has not started Dialysis yet.  On arrival to the emergency room on 8/29  patient suffered a cardiac arrest with asystole.  He received CPR x15 minutes.  He receieved hyperkalemic protocol due to his underlying chronic kidney disease with 1 amp bicarb, 1 amp D50, insulin 10 units regular IV.  He received epi x3.  He had ROSC after 15 minutes.  He did require very brief Neo-Synephrine for hypotension.  He had significant agitation was started on sedation with Versed and Fentanyl.    Patient was transported to [facility] ICU for [name] to admit.  Remains intubated without significant neurologic recovery. EEG and CT suggestive of severe anoxic brain injury  SIGNIFICANT PAST MEDICAL HISTORY   Hypertension, hyperlipidemia, PAD, chronic kidney disease stage V  SIGNIFICANT EVENTS:  8/26-  TTS HD - last 07/29/2019 Larry Banks (old tunneled Rt HD cah) 8/27 - PD cath - for a planned change from iHD to PD 8/29- Cardiac arrest, CPR x15 minutes, epi x3. Per renal CXR- c/w pulmonary edema 8/31- 1 unit PRBC, paralyzed due to vent dysynchrony 9/1- off pressors 9/5 antibiotics restarted- fevers, leukocytosis, arrhythmias  STUDIES:   8/29 CT head  > no acute abnormality 8/29 CT abdomen pelvis > bilateral lower lobe consolidation with small pleural effusion.  Groundglass opacities lower portion of the lung.  Nonobstructive renal calculus, 8/29 Echo > ef 45-50%, hypokineses of the mid-inferior  and lateral wall 8/29 - EEG> diffuse slowing and generalized suppression suggestive of profound encephalopathy, no seizure activity 9/1 CT head>severe diffuse brain edema compatible with widespread hypoxic injury 9/2 EEG> suggestive ofprofound diffuse encephalopathy,could besecondary to anoxic/hypoxic brain injury.No seizures or epileptiform discharges were seen throughout the recording.  9/3 MRI brain>Diffuse hypoxic ischemic injury most pronounced affecting the cerebral hemispheric cortex but with some involvement also of the basal ganglia and thalami. 9/4 & 9/5: CXR- no opacities 9/13: CXR-no focal infiltrates or opacities.New feeding catheter extending into the stomach. No acute abnormality is seen. 9/16 CXR: no acute changes  CULTURES:  8/29 BC >>Negative  8/29 Sputum >> Rare GNR, GPC>> Normal Flora 8/29 COVID 19 >> NEG  MRSA surveillance negative  9/5 blood culture >>NGTD 9/5 urine culture >> negative 9/13 Sputum>> abundant GNR, rare GPC 9/13 Blood>>   ANTIBIOTICS:  Unasyn 8/29 >9/1 Cefepime 9/5> 9/11 vanc 9/5 > 9/9  LINES/TUBES:  Peritoneal cath 8/27 (PTA )  R HD cath ? >>  ------- 8/29 ETT >> 9/10 Trach (Larry Banks) 9/10 >>  8/29 L IJ CVL> 9/4 8/29 A line>>8/30 No foley. Unclear when D/ced.   CONSULTANTS:  Nephrology  Neurology  No events overnight, continues to intermittently seize  CONSTITUTIONAL: BP 105/85   Pulse 100   Temp 98.2 F (36.8 C) (Oral)   Resp 17   Ht 5\' 6"  (1.676 m)   Wt 78.2 kg   SpO2 100%   BMI 27.83 kg/m   I/O last 3 completed shifts: In: 1690.3 [I.V.:825.6; NG/GT:160; IV Piggyback:704.7] Out:  1102 [Urine:150; Other:951; Stool:1]  PE General: Acute on chronically ill appearing male, NAD, myoclonus vs seizure when patient is stimulated HENT: Ong/AT, PERRL, EOM-spontaneous and MMM, trach in place CV: IRIR, Nl S1/S2 and -M/R/G Lungs: Coarse BS diffusely ABD: Soft, NT, ND and +BS Skin: warm. No rash. Neuro: Completely comatose off  sedation  I reviewed CXR myself, trach is in a good position  RESOLVED PROBLEM LIST  Discussed with neuro-MD and bedside RN  ASSESSMENT AND PLAN   48 year old male with history of ESRD on dialysis who presented to [facility] for 2d history of SOB and subsequently had PEA cardiac arrest while in ED. ROSC achieved after 70min of CPR and was transferred to [facility] ICU for further management.  PEA cardiac arrest in hospital. ROSC achieved after 3min of CPR. Etiology of arrest unclear. Transitioned from HD to PD and had missed dialysis the day of admission, presenting K was WNL.  S/p 36 degree TTM.    - Continue tele monitoring - Anti-HTN as ordered with holding parameters - Spoke with ethics this AM, will get involved in decision make, spoke with neurology will evaluate and prognosticate patient, spoke with nephrology, feel that continuing dialysis is futile, asked Dr. Lamonte Banks who previously knew the patient to opine on care.  I feel that continued full support at this point is futile and should be discontinued and transition to comfort but wife continues to refuse to talk to palliative care.  I spoke with the wife, she believes that patient will return back to normal as a facebook support group concerning anoxic injury is rather optimistic.  When I called, she immediately answered asking "why do you keep calling me?" even though this was the first time I have ever spoken to her.  We had a long discussion over the phone about plan of care.  She continues to insist on full support.  I informed her that we are going to have a meeting as a the medical team that she is more than welcomed to be a part off about prognosis and plan of care for Larry Banks but if the decision is to proceed with DNR status, d/c dialysis and no longer place back on mechanical support that she will have some time to find alternative facilities and she informed me that we will not be making that decision for her husband and hung up the  phone before the conversation ended before I was able to find a time where she can be a part of the conversation/meeting.  The bedside RN as well as the unit AD were informed of the events of the day as to keep them in the loop of the ongoing conversation with the wife. - No changes in trach size or type - Attempt to get off the vent today  Malnutrition - Would recommend against G-tube unless family really wants to continue aggressive care, very little to add here  Acute hypoxic respiratory failure. Trached on 9/10 for prolonged ETT time. CXR today neg for acute changes. - Continue standard trach care - VAP protocol  Encephalopathy. EEG most consistent with anoxic brain injury. MRI with restricted diffusion throughout the entire cortex. - Prolonged support desired by family to allow for possible neurological improvement.  -Appreciate palliative care input  Rhythmic L hand movements: renally dosed keppra   ESRD stage 5, urinary retention requiring frequent straight catheterization. Hyperphosphatemia. -PD cath placed the end of august however is no longer a candidate in light of anoxic brain  injury -MWF HD schedule P: -PD cath removal tomorrow -continue binders for hyperphosphatemia   Anemia of chronic disease and iron deficiency-stable. Avoid iron infusions 2/2 possible reaction/angioadema after receiving a dose  Hypertension-improved. Continue amlodipine and metoprolol  Type 2 DM, uncontrolled, but improved. Continue same Lantus 30 + SSI  Best Practice / Goals of Care / Disposition.   Diet: Tube feeds restart when NG tube placed, will need PEG Pain/Anxiety/Delirium protocol (if indicated): prn meds  VAP protocol (if indicated): ordered DVT prophylaxis: heparin SQ GI prophylaxis: famotidine Glucose control: SSI, Lantus Mobility: Bed Code Status: Full Family Communication: No family at bedside 9/15 am Disposition: ICU  The patient is critically ill with multiple organ systems  failure and requires high complexity decision making for assessment and support, frequent evaluation and titration of therapies, application of advanced monitoring technologies and extensive interpretation of multiple databases.   Critical Care Time devoted to patient care services described in this note is  90  Minutes. This time reflects time of care of this signee Dr Jennet Maduro. This critical care time does not reflect procedure time, or teaching time or supervisory time of PA/NP/Med student/Med Resident etc but could involve care discussion time.  Rush Farmer, M.D. [facility] Pulmonary/Critical Care Medicine. Pager: [phone]. After hours pager: [phone].

## 2019-08-24 NOTE — Progress Notes (Addendum)
Reason for consult: seizure  Subjective: Patient is continuing to have seizures.  Over the weekend, Vimpat was added with no significant change in seizure frequency.  ROS: Unable to obtain due to poor mental status  Examination  Vital signs in last 24 hours: Temp:  [97.8 F (36.6 C)-98.6 F (37 C)] 98.2 F (36.8 C) (09/21 1116) Pulse Rate:  [83-103] 83 (09/21 1200) Resp:  [12-22] 14 (09/21 1200) BP: (72-111)/(55-89) 111/85 (09/21 1200) SpO2:  [94 %-100 %] 100 % (09/21 1200) FiO2 (%):  [40 %] 40 % (09/21 1200) Weight:  [78.2 kg] 78.2 kg (09/21 0600)  General: lying in bed CVS: pulse-normal rate and rhythm RS: breathing comfortably Extremities: normal   Neuro: MS:  Opens eyes slightly to verbal stimulation, not following any commands, nonverbal does not track examiner CN: Pupils are equal and reactive, oculocephalic and corneal reflexes present, gag reflex present Motor:  Withdraws to noxious stimuli in all 4 extremities, no spontaneous movement Plantar: Extensor bilaterally Gait: not tested  Basic Metabolic Panel: Recent Labs  Lab 08/18/19 0256  08/19/19 0220 08/23/2019 0537 08/28/2019 0219 08/22/19 0219 08/22/19 2338 08/23/19 0544 08/23/19 0940 08/24/19 0946  NA 137  --  136  136 135 134*  135 134* 128* 133* 135 136  K 4.8  --  4.4  4.4 5.4* 5.3*  5.3* 6.2* 7.4* >7.5* 4.4 5.2*  CL 92*  --  92*  92* 90* 92*  93* 89*  --  88*  --  93*  CO2 19*  --  22  21* 21* 21*  22 15*  --  13*  --  20*  GLUCOSE 180*  --  191*  191* 110* 117*  120* 74  --  112*  --  104*  BUN 201*   < > 109*  104* 179* 91*  90* 148*  --  195*  --  85*  CREATININE 11.54*  --  6.79*  6.70* 10.11* 6.93*  6.85* 9.74*  --  13.02*  --  7.36*  CALCIUM 9.4  --  9.7  9.6 9.5 9.3  9.4 9.4  --  8.6*  --  8.3*  MG  --   --  2.8*  --   --   --   --   --   --   --   PHOS 10.8*  --  6.3* 9.6* 8.2*  --   --  >30.0*  --   --    < > = values in this interval not displayed.    CBC: Recent Labs   Lab 08/18/19 0256 08/19/19 0220 08/18/2019 0239 08/09/2019 0219 08/22/19 2338 08/23/19 0544 08/23/19 0940  WBC 13.8* 11.5* 11.4* 11.6*  --  12.7*  --   HGB 9.7* 10.6* 10.1* 10.6* 10.2* 9.3* 10.5*  HCT 30.0* 34.2* 32.4* 35.0* 30.0* 28.9* 31.0*  MCV 89.3 90.2 90.0 91.1  --  90.3  --   PLT 458* 481* 456* 401*  --  377  --      Coagulation Studies: No results for input(s): LABPROT, INR in the last 72 hours.  Imaging  MRI brain without contrast 08/06/2019:Diffuse hypoxic ischemic injury most pronounced affecting the cerebral hemispheric cortex but with some involvement also of the basal ganglia and thalami.   ASSESSMENT AND PLAN:48 year old male status post cardiac arrest and tracheostomy now with frequent seizure-like activity.   Cardiac arrest Anoxic brain injury Status post trach Seizures Encephalopathy secondary to diffuse anoxic brain injury -Patient started having seizures on 08/19/2019.  Since  then we have added multiple AEDs including Keppra, valproic acid, Klonopin and Vimpat without resolution of seizures.  -I suspect the seizures will be very difficult to control due to the extent of severe hypoxic/anoxic brain injury.  Recommendations - Contiue LEV 1 g twice daily, unable to increase dose further due to poor renal function -Will increase Vimpat to 200 mg twice daily, increase clonazepam to 3 mg 3 times daily, increase Depakote to [phone]  (VPA level 86) -Recheck VPA and ammonia tomorrow -As I had discussed with wife previously, I am concerned that the only way to control his seizures at this point is by starting him on propofol and Versed (for suppression).  However, I suspect that given the extent of his brain injury and already very difficult to control seizures, this will likely not lead to improvement in outcome and neurologic recovery. -Will continue to discuss with wife and family again regarding goals of care -Seizure precautions - PRN IV Ativan 2 mg  only to be given for generalized tonic-clonic seizure lasting more than 3 minutes.    CRITICAL CARE Performed by: Lora Havens  Total critical care time: 45 minutes  Critical care time was exclusive of separately billable procedures and treating other patients.  Critical care was necessary to treat or prevent imminent or life-threatening deterioration.  Critical care was time spent personally by me on the following activities: development of treatment plan with patient and/or surrogate as well as nursing, discussions with consultants, evaluation of patient's response to treatment, examination of patient, obtaining history from patient or surrogate, ordering and performing treatments and interventions, ordering and review of laboratory studies, ordering and review of radiographic studies, pulse oximetry and re-evaluation of patient's condition.

## 2019-08-24 NOTE — Progress Notes (Signed)
Around 45, patient's wife called this RN. Notified her of MD trying to get in touch with her and that he was outside of the room. Phone given to [facility] MD who discussed prognosis and assessment findings with her. Wife very hostile towards MD during discussion.

## 2019-08-25 LAB — BASIC METABOLIC PANEL
Anion gap: 26 — ABNORMAL HIGH (ref 5–15)
BUN: 108 mg/dL — ABNORMAL HIGH (ref 6–20)
CO2: 18 mmol/L — ABNORMAL LOW (ref 22–32)
Calcium: 8.3 mg/dL — ABNORMAL LOW (ref 8.9–10.3)
Chloride: 92 mmol/L — ABNORMAL LOW (ref 98–111)
Creatinine, Ser: 8.72 mg/dL — ABNORMAL HIGH (ref 0.61–1.24)
GFR calc Af Amer: 7 mL/min — ABNORMAL LOW (ref >60)
GFR calc non Af Amer: 6 mL/min — ABNORMAL LOW (ref >60)
Glucose, Bld: 76 mg/dL (ref 70–99)
Potassium: 5.8 mmol/L — ABNORMAL HIGH (ref 3.5–5.1)
Sodium: 136 mmol/L (ref 135–145)

## 2019-08-25 LAB — VALPROIC ACID LEVEL: Valproic Acid Lvl: 75 ug/mL (ref 50.0–100.0)

## 2019-08-25 LAB — GLUCOSE, CAPILLARY
Glucose-Capillary: 78 mg/dL (ref 70–99)
Glucose-Capillary: 75 mg/dL (ref 70–99)
Glucose-Capillary: 83 mg/dL (ref 70–99)
Glucose-Capillary: 85 mg/dL (ref 70–99)
Glucose-Capillary: 90 mg/dL (ref 70–99)
Glucose-Capillary: 74 mg/dL (ref 70–99)

## 2019-08-25 LAB — AMMONIA: Ammonia: 14 umol/L (ref 9–35)

## 2019-08-25 NOTE — Progress Notes (Signed)
PROGRESS NOTE    Larry Banks  IOM:[id number] DOB: [birth year] DOA: 07/16/2019 PCP: Curlene Labrum, MD   Brief Narrative:  48 year old with history of essential hypertension, peripheral arterial disease, hyperlipidemia, CKD stage V developed shortness of breath at home came to any pain hospital.  Usually follows with [facility] and recently was transitioned to peritoneal dialysis.  Upon arrival to the ER he went into cardiac arrest with asystole requiring CPR.  Transferred to ICU after ROSC 15 minutes later.  Initially required pressors.  HD catheter placed 8/26.  CT of the head was negative.  CT of the abdomen pelvis initially showed bilateral lower lobes pleural effusions nonobstructive renal stone.  Echocardiogram showed ejection fraction 45-50% with hypokinesis.  EEG showed profound slowing of brain activity but no seizure.  CT of the head repeated on 9/1 showed severe diffuse brain edema with widespread hypoxic injury.  Repeat EEG 9/2 showed profound diffuse encephalopathy secondary to hypoxic brain injury.  MRI brain 9/3 showed hypoxic diffuse brain injury.  Most of the cultures remain negative except sputum culture showed above-noted gram-negative rods.  Initially has received Unasyn, cefepime and vancomycin but currently off antibiotics.  Intubated from 8/29 to 9/10.  Having recurrent seizures secondary to anoxic brain injury.  Difficult to control.  Critical care met with patient and family has made patient DNR with no escalation of care.   Assessment & Plan:   Active Problems:   Cardiac arrest ([place])   DNR (do not resuscitate) discussion   Acute respiratory failure ([place])   Ventilator dependence ([place])   ESRD needing dialysis ([place])   Anoxic brain damage ([place])   Seizures ([place])   Acute pulmonary edema ([place])   Tracheostomy status ([place])   Palliative care by specialist   PEA cardiac arrest Encephalopathy secondary to anoxic brain injury Recurrent focal seizures due  to anoxic brain injury -CT of the head/MRI and EEG are consistent with anoxic brain injury.  Neurology team following. - Supportive care.  Status post tracheostomy -Echocardiogram-ejection fraction 45-50% - Poor prognosis, any further medical treatment is basically futile.  Unlikely will have any neurological recovery.  Acute hypoxic respiratory failure secondary to mucous plugging, stable for now -Status post bronchoscopy overnight on 9/19.    Moderate protein calorie malnutrition Dysphagia due to encephalopathy -Holding off on any advanced feeding tube placement  ESRD on hemodialysis -Nephrology team following  Anemia of chronic disease/iron deficiency -Apparently developed angioedema reaction secondary to iron infusion.  We will hold off on this.  Monitor hemoglobin  Diabetes mellitus type 2, insulin-dependent, relatively stable -Resume home meds.  Insulin sliding scale Accu-Chek -Lantus 30 units daily -NovoLog 3 units every 4 hours  Essential hypertension with sinus tachycardia -On amlodipine and metoprolol.  Per critical care/palliative care documentation-met with the family and decided no further escalation of care, transition to DNR.  In understanding with likely withdrawal of care tomorrow morning.  DVT prophylaxis: Subcu heparin Code Status: Full code Family Communication: None at bedside   Consultants:   Nephrology  General surgery  [name]  Antimicrobials:  Unasyn 8/29 >9/1 Cefepime 9/5> 9/11 vanc 9/5 > 9/9   Subjective: Patient remains unresponsive.  No meaningful interaction.  Review of Systems Otherwise negative except as per HPI, including: Unable to obtain Objective: Vitals:   08/25/19 1000 08/25/19 1100 08/25/19 1107 08/25/19 1125  BP: 122/79 132/82 132/82   Pulse:   81   Resp: '14 15 14   '$ Temp:    98.3 F (36.8  C)  TempSrc:    Oral  SpO2:   100%   Weight:      Height:        Intake/Output Summary (Last 24 hours) at 08/25/2019 1222 Last  data filed at 08/25/2019 1100 Gross per 24 hour  Intake 625.09 ml  Output 10 ml  Net 615.09 ml   Filed Weights   08/23/19 0410 08/24/19 0600 08/25/19 0500  Weight: 79 kg 78.2 kg 79.8 kg    Examination:  Patient remains unresponsive to physical or verbal stimuli.  Anterior breath sounds are rhonchorous.  NG tube in place.  Minimal abdominal sounds.  Normal sinus rhythm.  No lower extremity edema noted.  Tracheostomy in place attached to ventilator.  Some oral secretions.  Withdrawal to pain symmetrically.  Condom catheter in place Tracheostomy Right chest wall hemodialysis catheter   Data Reviewed:   CBC: Recent Labs  Lab 08/19/19 0220 08/16/2019 0239 08/14/2019 0219 08/22/19 2338 08/23/19 0544 08/23/19 0940  WBC 11.5* 11.4* 11.6*  --  12.7*  --   HGB 10.6* 10.1* 10.6* 10.2* 9.3* 10.5*  HCT 34.2* 32.4* 35.0* 30.0* 28.9* 31.0*  MCV 90.2 90.0 91.1  --  90.3  --   PLT 481* 456* 401*  --  377  --    Basic Metabolic Panel: Recent Labs  Lab 08/19/19 0220 08/22/2019 0537 08/06/2019 0219 08/22/19 0219 08/22/19 2338 08/23/19 0544 08/23/19 0940 08/24/19 0946 08/25/19 0312  NA 136  136 135 134*  135 134* 128* 133* 135 136 136  K 4.4  4.4 5.4* 5.3*  5.3* 6.2* 7.4* >7.5* 4.4 5.2* 5.8*  CL 92*  92* 90* 92*  93* 89*  --  88*  --  93* 92*  CO2 22  21* 21* 21*  22 15*  --  13*  --  20* 18*  GLUCOSE 191*  191* 110* 117*  120* 74  --  112*  --  104* 76  BUN 109*  104* 179* 91*  90* 148*  --  195*  --  85* 108*  CREATININE 6.79*  6.70* 10.11* 6.93*  6.85* 9.74*  --  13.02*  --  7.36* 8.72*  CALCIUM 9.7  9.6 9.5 9.3  9.4 9.4  --  8.6*  --  8.3* 8.3*  MG 2.8*  --   --   --   --   --   --   --   --   PHOS 6.3* 9.6* 8.2*  --   --  >30.0*  --   --   --    GFR: Estimated Creatinine Clearance: 10.2 mL/min (A) (by C-G formula based on SCr of 8.72 mg/dL (H)). Liver Function Tests: Recent Labs  Lab 08/19/19 0220 08/17/2019 0537 08/07/2019 0219 08/23/19 0544  ALBUMIN 3.2* 3.1*  3.1* 3.0*   No results for input(s): LIPASE, AMYLASE in the last 168 hours. Recent Labs  Lab 08/25/19 0312  AMMONIA 14   Coagulation Profile: No results for input(s): INR, PROTIME in the last 168 hours. Cardiac Enzymes: No results for input(s): CKTOTAL, CKMB, CKMBINDEX, TROPONINI in the last 168 hours. BNP (last 3 results) No results for input(s): PROBNP in the last 8760 hours. HbA1C: No results for input(s): HGBA1C in the last 72 hours. CBG: Recent Labs  Lab 08/25/19 0110 08/25/19 0334 08/25/19 0656 08/25/19 0738 08/25/19 1124  GLUCAP 78 75 83 85 90   Lipid Profile: No results for input(s): CHOL, HDL, LDLCALC, TRIG, CHOLHDL, LDLDIRECT in the last 72 hours. Thyroid Function Tests:  No results for input(s): TSH, T4TOTAL, FREET4, T3FREE, THYROIDAB in the last 72 hours. Anemia Panel: No results for input(s): VITAMINB12, FOLATE, FERRITIN, TIBC, IRON, RETICCTPCT in the last 72 hours. Sepsis Labs: No results for input(s): PROCALCITON, LATICACIDVEN in the last 168 hours.  Recent Results (from the past 240 hour(s))  Expectorated sputum assessment w rflx to resp cult     Status: None   Collection Time: 08/16/19  3:29 PM   Specimen: Tracheal Aspirate; Sputum  Result Value Ref Range Status   Specimen Description TRACHEAL ASPIRATE  Final   Special Requests NONE  Final   Sputum evaluation   Final    THIS SPECIMEN IS ACCEPTABLE FOR SPUTUM CULTURE Performed at [facility], [address], [place], [place] [address]    Report Status 08/16/2019 FINAL  Final  Culture, respiratory     Status: None   Collection Time: 08/16/19  3:29 PM   Specimen: Tracheal Aspirate  Result Value Ref Range Status   Specimen Description TRACHEAL ASPIRATE  Final   Special Requests NONE Reflexed from [id number]  Final   Gram Stain   Final    ABUNDANT SQUAMOUS EPITHELIAL CELLS PRESENT ABUNDANT WBC PRESENT, PREDOMINANTLY PMN ABUNDANT GRAM NEGATIVE RODS RARE GRAM POSITIVE COCCI    Culture   Final     FEW Consistent with normal respiratory flora. Performed at [facility], [place] [address] [place], [place] [address]    Report Status 08/19/2019 FINAL  Final  Culture, blood (routine x 2)     Status: None   Collection Time: 08/16/19  4:28 PM   Specimen: BLOOD  Result Value Ref Range Status   Specimen Description BLOOD LEFT ANTECUBITAL  Final   Special Requests AEROBIC BOTTLE ONLY Blood Culture adequate volume  Final   Culture   Final    NO GROWTH 5 DAYS Performed at [facility] [address] [place], [place] [address]    Report Status 08/08/2019 FINAL  Final  Culture, blood (routine x 2)     Status: None   Collection Time: 08/16/19  4:39 PM   Specimen: BLOOD  Result Value Ref Range Status   Specimen Description BLOOD BLOOD RIGHT HAND  Final   Special Requests AEROBIC BOTTLE ONLY Blood Culture adequate volume  Final   Culture   Final    NO GROWTH 5 DAYS Performed at [facility], [place] [address] [place], [place] [address]    Report Status 09/02/2019 FINAL  Final  Culture, respiratory (non-expectorated)     Status: None (Preliminary result)   Collection Time: 08/22/19 11:27 PM   Specimen: Tracheal Aspirate; Respiratory  Result Value Ref Range Status   Specimen Description TRACHEAL ASPIRATE  Final   Special Requests NONE  Final   Gram Stain   Final    FEW SQUAMOUS EPITHELIAL CELLS PRESENT MODERATE WBC PRESENT, PREDOMINANTLY PMN FEW GRAM POSITIVE COCCI MODERATE FEW GRAM POSITIVE RODS RARE GRAM NEGATIVE RODS    Culture   Final    CULTURE REINCUBATED FOR BETTER GROWTH Performed at [facility], [place] [address], [place],  [address]    Report Status PENDING  Incomplete         Radiology Studies: No results found.      Scheduled Meds: . amLODipine  5 mg Per Tube Daily  . calcium acetate (Phos Binder)  1,334 mg Per Tube TID WC  . chlorhexidine gluconate (MEDLINE KIT)  15 mL Mouth Rinse BID  . Chlorhexidine  Gluconate Cloth  6 each  Topical Daily  . clonazepam  3 mg Per Tube TID  . darbepoetin (ARANESP) injection - DIALYSIS  60 mcg Intravenous Q Tue-HD  . famotidine  10 mg Per Tube BID  . fentaNYL (SUBLIMAZE) injection  200 mcg Intravenous Once  . mouth rinse  15 mL Mouth Rinse 10 times per day  . metoprolol tartrate  12.5 mg Per NG tube BID  . sodium chloride flush  3 mL Intravenous Q12H   Continuous Infusions: . sodium chloride 250 mL (08/16/19 1736)  . sodium chloride    . sodium chloride    . sodium chloride    . sodium chloride    . sodium chloride 10 mL/hr at 08/25/19 1100  . sodium chloride    . sodium chloride    . lacosamide (VIMPAT) IV Stopped (08/25/19 1054)  . levETIRAcetam Stopped (08/25/19 0714)  . valproate sodium Stopped (08/24/19 1537)  . valproate sodium Stopped (08/25/19 1023)     LOS: 24 days   Time spent= 35 mins     Arsenio Loader, MD [facility]  If 7PM-7AM, please contact night-coverage [email or web address] 08/25/2019, 12:22 PM

## 2019-08-25 NOTE — Progress Notes (Signed)
Renal Navigator notified OP HD clinic/[facility] of plan to transition to comfort care.  Alphonzo Cruise, [place] Renal Navigator [phone]

## 2019-08-25 NOTE — Progress Notes (Signed)
[facility] Progress Note    Assessment/ Plan:   1. ESRD THS Via R IJ TDC with [facility]. Azotemia noted, likely from catabolic state/ TF. HD previously on MWF converted to TTS sched. - I agree that prognosis is very grim and more importantly long term RRT  Not improving QOL in the setting of a anoxic injury. D/w Dr. Nelda Marseille and I agree we should discontinue RRT.  Holding off on writing any further HD orders; Dr. Nelda Marseille meeting with the spouse this AM.  2. Seizures: on Keppra,worsening seizures occurring over the week-->neuro reconsulted -->still having seizures, appreciate their assistance. Burst suppression therapy declined.  3. Anoxic brain injury - continue scope of care with all aggressive interventions per family- EEG with essentially devastating injury, temporal lobe spikes, low likelihood of recovery. He is unlikely to achieve prior level of functioning and quality of life. Support ethics c/s if deemed appropriate.  4. S/p PD cath placement 8/27: no longer a PD candidate given anoxic brain injury. Was going to have PD cath removed. Appreciate the assistance of gen surg-->Agree with Dr. Pollie Friar note, further procedures/ invasive interventions likely to achieve overall benefit. Will not pursue at this time esp with increased seizure activity. Agree no need to remove if moving towards palliative.  5. VDRF -back on the vent  6. Hyperphosphatemia on binders  7. Anemia of CKD. Hgb stable. Iron low, had angioedema to ferrlicet vs antibiotic. Listed as allergy, d/w pharmacy. ON aranesp 60 q week  8. Dispo: he has a devastating neurologic injury with poor prognosis and slim hope of functional recovery. Dialysis is not adding to his QOL and he remains a suboptimal candidate for short or long term dialysis.   Subjective:   No e/o seizure activity this AM. No events overnight.   Objective:   BP 116/77 (BP Location: Left Arm)   Pulse 96   Temp 97.6 F  (36.4 C) (Oral)   Resp 14   Ht '5\' 6"'$  (1.676 m)   Wt 79.8 kg   SpO2 100%   BMI 28.40 kg/m   Intake/Output Summary (Last 24 hours) at 08/25/2019 0959 Last data filed at 08/25/2019 0800 Gross per 24 hour  Intake 581.38 ml  Output 10 ml  Net 571.38 ml   Weight change: 1.6 kg  Physical Exam: General: NAD, unresponsive HEENT: eyes closed,trach in place, on vent CV: Heart RRR Lungs: Clear anteriorly Abd: abd SNT/ND with normal BS, some distention but soft Extremities: Trace LE edema ACCESS: RLQ PD cath, R IJ TDC  Imaging: No results found.  Labs: BMET Recent Labs  Lab 08/19/19 0220 08/22/2019 0537 08/31/2019 0219 08/22/19 0219 08/22/19 2338 08/23/19 0544 08/23/19 0940 08/24/19 0946 08/25/19 0312  NA 136  136 135 134*  135 134* 128* 133* 135 136 136  K 4.4  4.4 5.4* 5.3*  5.3* 6.2* 7.4* >7.5* 4.4 5.2* 5.8*  CL 92*  92* 90* 92*  93* 89*  --  88*  --  93* 92*  CO2 22  21* 21* 21*  22 15*  --  13*  --  20* 18*  GLUCOSE 191*  191* 110* 117*  120* 74  --  112*  --  104* 76  BUN 109*  104* 179* 91*  90* 148*  --  195*  --  85* 108*  CREATININE 6.79*  6.70* 10.11* 6.93*  6.85* 9.74*  --  13.02*  --  7.36* 8.72*  CALCIUM 9.7  9.6 9.5 9.3  9.4 9.4  --  8.6*  --  8.3* 8.3*  PHOS 6.3* 9.6* 8.2*  --   --  >30.0*  --   --   --    CBC Recent Labs  Lab 08/19/19 0220 08/27/2019 0239 08/31/2019 0219 08/22/19 2338 08/23/19 0544 08/23/19 0940  WBC 11.5* 11.4* 11.6*  --  12.7*  --   HGB 10.6* 10.1* 10.6* 10.2* 9.3* 10.5*  HCT 34.2* 32.4* 35.0* 30.0* 28.9* 31.0*  MCV 90.2 90.0 91.1  --  90.3  --   PLT 481* 456* 401*  --  377  --     Medications:    . amLODipine  5 mg Per Tube Daily  . calcium acetate (Phos Binder)  1,334 mg Per Tube TID WC  . chlorhexidine gluconate (MEDLINE KIT)  15 mL Mouth Rinse BID  . Chlorhexidine Gluconate Cloth  6 each Topical Daily  . clonazepam  3 mg Per Tube TID  . darbepoetin (ARANESP) injection - DIALYSIS  60 mcg Intravenous Q  Tue-HD  . famotidine  10 mg Per Tube BID  . fentaNYL (SUBLIMAZE) injection  200 mcg Intravenous Once  . insulin aspart  0-20 Units Subcutaneous Q4H  . insulin aspart  3 Units Subcutaneous Q4H  . insulin glargine  30 Units Subcutaneous Daily  . mouth rinse  15 mL Mouth Rinse 10 times per day  . metoprolol tartrate  12.5 mg Per NG tube BID  . sodium chloride flush  3 mL Intravenous Q12H      Otelia Santee, MD 08/25/2019, 9:59 AM

## 2019-08-25 NOTE — Progress Notes (Signed)
Nutrition Brief Note  Chart reviewed. Per CCM note, pt is now a full DNR, no further escalation of care to include dialysis and family to come in tomorrow in preparation for withdrawal of care. No further nutrition interventions warranted at this time. TF is off at this time. Please re-consult as needed.    Gaynell Face, MS, RD, LDN Inpatient Clinical Dietitian Pager: [phone] Weekend/After Hours: [phone]

## 2019-08-25 NOTE — Progress Notes (Signed)
PULMONARY / CRITICAL CARE MEDICINE   NAME:  Larry Banks, MRN:  [id number], DOB:  Dec 01, 1971, LOS: 24 ADMISSION DATE:  07/07/2019, CONSULTATION DATE:  07/21/2019  REFERRING MD:  EDP Dr. Tomi Bamberger   CHIEF COMPLAINT:  Cardiac Arrest/Resp Failure   BRIEF HISTORY:    48 year old male with medical history significant for hypertension, PAD, hyperlipidemia, CKD Stage 5 developed shortness of breath at home.  Patient does have CKD Stage 5 .  Is followed at [facility].  Recently had peritoneal dialysis catheter placed on August 27. Has not started Dialysis yet.  On arrival to the emergency room on 8/29  patient suffered a cardiac arrest with asystole.  He received CPR x15 minutes.  He receieved hyperkalemic protocol due to his underlying chronic kidney disease with 1 amp bicarb, 1 amp D50, insulin 10 units regular IV.  He received epi x3.  He had ROSC after 15 minutes.  He did require very brief Neo-Synephrine for hypotension.  He had significant agitation was started on sedation with Versed and Fentanyl.    Patient was transported to [facility] ICU for [name] to admit.  Remains intubated without significant neurologic recovery. EEG and CT suggestive of severe anoxic brain injury  SIGNIFICANT PAST MEDICAL HISTORY   Hypertension, hyperlipidemia, PAD, chronic kidney disease stage V  SIGNIFICANT EVENTS:  8/26-  TTS HD - last 07/29/2019 [name] (old tunneled Rt HD cah) 8/27 - PD cath - for a planned change from iHD to PD 8/29- Cardiac arrest, CPR x15 minutes, epi x3. Per renal CXR- c/w pulmonary edema 8/31- 1 unit PRBC, paralyzed due to vent dysynchrony 9/1- off pressors 9/5 antibiotics restarted- fevers, leukocytosis, arrhythmias  STUDIES:   8/29 CT head  > no acute abnormality 8/29 CT abdomen pelvis > bilateral lower lobe consolidation with small pleural effusion.  Groundglass opacities lower portion of the lung.  Nonobstructive renal calculus, 8/29 Echo > ef 45-50%, hypokineses of the mid-inferior  and lateral wall 8/29 - EEG> diffuse slowing and generalized suppression suggestive of profound encephalopathy, no seizure activity 9/1 CT head>severe diffuse brain edema compatible with widespread hypoxic injury 9/2 EEG> suggestive ofprofound diffuse encephalopathy,could besecondary to anoxic/hypoxic brain injury.No seizures or epileptiform discharges were seen throughout the recording.  9/3 MRI brain>Diffuse hypoxic ischemic injury most pronounced affecting the cerebral hemispheric cortex but with some involvement also of the basal ganglia and thalami. 9/4 & 9/5: CXR- no opacities 9/13: CXR-no focal infiltrates or opacities.New feeding catheter extending into the stomach. No acute abnormality is seen. 9/16 CXR: no acute changes  CULTURES:  8/29 BC >>Negative  8/29 Sputum >> Rare GNR, GPC>> Normal [place] 8/29 COVID 19 >> NEG  MRSA surveillance negative  9/5 blood culture >>NGTD 9/5 urine culture >> negative 9/13 Sputum>> abundant GNR, rare GPC 9/13 Blood>>   ANTIBIOTICS:  Unasyn 8/29 >9/1 Cefepime 9/5> 9/11 vanc 9/5 > 9/9  LINES/TUBES:  Peritoneal cath 8/27 (PTA )  R HD cath ? >>  ------- 8/29 ETT >> 9/10 Trach (JY) 9/10 >>  8/29 L IJ CVL> 9/4 8/29 A line>>8/30 No foley. Unclear when D/ced.   CONSULTANTS:  Nephrology  Neurology  No events overnight, completely unresponsive and not breathing  CONSTITUTIONAL: BP 116/77 (BP Location: Left Arm)   Pulse 96   Temp 97.6 F (36.4 C) (Oral)   Resp 14   Ht '5\' 6"'$  (1.676 m)   Wt 79.8 kg   SpO2 100%   BMI 28.40 kg/m   I/O last 3 completed shifts: In: 980 [  I.V.:299.4; NG/GT:90; IV Piggyback:590.6] Out: 10 [Urine:10]  PE General: Acute on chronically ill appearing male, NAD, unresponsive  HENT: [place]/AT, PERRL, EOM-I and MMM, trach in place CV: IRIR, Nl S1/S2 and -M/R/G Lungs: Coarse BS diffusely ABD: Soft, NT, ND and +BS Skin: warm. No rash. Neuro: Completely comatose off sedation  I reviewed CXR myself, trach is  in a good position  RESOLVED PROBLEM LIST  Discussed with neuro-MD and bedside RN  ASSESSMENT AND PLAN   48 year old male with history of ESRD on dialysis who presented to [facility] for 2d history of SOB and subsequently had PEA cardiac arrest while in ED. ROSC achieved after 66mn of CPR [facility].  PEA cardiac arrest in hospital. ROSC achieved after 137m of CPR. Etiology of arrest unclear. Transitioned from HD to PD and had missed dialysis the day of admission, presenting K was WNL.  S/p 36 degree TTM.    - Continue tele monitoring - Anti-HTN as ordered with holding parameters - Tele monitoring - No changes in trach size or type - Attempt to get off the vent today  Malnutrition - Would recommend against G-tube unless family really wants to continue aggressive care, very little to add here  Acute hypoxic respiratory failure. Trached on 9/10 for prolonged ETT time. CXR today neg for acute changes. - Standard trach care - VAP protocol  Encephalopathy. EEG most consistent with anoxic brain injury. MRI with restricted diffusion throughout the entire cortex. - Continue anti-epileptics but no other sedatives  Rhythmic L hand movements: renally dosed keppra   ESRD stage 5, urinary retention requiring frequent straight catheterization. Hyperphosphatemia. -PD cath placed the end of august however is no longer a candidate in light of anoxic brain injury -MWF HD schedule P: - Keep PD cath for now, no changes - Continue binders for hyperphosphatemia   Anemia of chronic disease and iron deficiency-stable. Avoid iron infusions 2/2 possible reaction/angioadema after receiving a dose  Hypertension-improved. Continue amlodipine and metoprolol  Type 2 DM, uncontrolled, but improved. Continue same Lantus 30 + SSI  Spoke with family, neurology and nephrology.  Note from Dr. ByLamonte Sakaioted.  Met with daughter (over the phone) wife and sister.  Will  make a full DNR, no further escalation of care to include dialysis and family to come in tomorrow in preparation for withdrawal.  The wife is the only outlier feeling that full support should be offered but that is futile at this point and will not be offered.  Best Practice / Goals of Care / Disposition.   Diet: Tube feeds restart when NG tube placed, will need PEG Pain/Anxiety/Delirium protocol (if indicated): prn meds  VAP protocol (if indicated): ordered DVT prophylaxis: heparin SQ GI prophylaxis: famotidine Glucose control: SSI, Lantus Mobility: Bed Code Status: Full Family Communication: No family at bedside 9/15 am Disposition: ICU  The patient is critically ill with multiple organ systems failure and requires high complexity decision making for assessment and support, frequent evaluation and titration of therapies, application of advanced monitoring technologies and extensive interpretation of multiple databases.   Critical Care Time devoted to patient care services described in this note is  90  Minutes. This time reflects time of care of this signee Dr WeJennet MaduroThis critical care time does not reflect procedure time, or teaching time or supervisory time of PA/NP/Med student/Med Resident etc but could involve care discussion time.  WeRush FarmerM.D. [facility]. Pager: [phone]  After hours pager: [phone].

## 2019-08-25 NOTE — Progress Notes (Signed)
Patient's wife called. She plans to come to the hospital 9/23 around 1030 with her daughter and sons. Visitation guidelines of 4 total visitors with 2 at a time in the room explained.

## 2019-08-25 NOTE — Progress Notes (Addendum)
Reason for consult: seizure  Subjective: NAEO. No clinical seizures per RN.  ROS: Unable to obtain due to poor mental status  Examination  Vital signs in last 24 hours: Temp:  [97.6 F (36.4 C)-98.8 F (37.1 C)] 97.6 F (36.4 C) (09/22 0735) Pulse Rate:  [77-103] 96 (09/22 0800) Resp:  [13-19] 14 (09/22 0800) BP: (102-131)/(73-85) 116/77 (09/22 0800) SpO2:  [84 %-100 %] 100 % (09/22 0800) FiO2 (%):  [40 %] 40 % (09/22 0800) Weight:  [79.8 kg] 79.8 kg (09/22 0500)  General: lying in bed CVS: pulse-normal rate and rhythm RS: breathing comfortably Extremities: normal   Neuro: MS: Opens eyes slightly to  Noxious stimulation, not following any commands, nonverbal does not track examiner [id number] are equal and reactive, oculocephalic and corneal reflexes present, gag reflex present Motor: Withdraws to noxious stimuli in all 4 extremities, no spontaneous movement Plantar:Extensor bilaterally Gait: not tested  Basic Metabolic Panel: Recent Labs  Lab 08/19/19 0220 08/17/2019 0537 08/28/2019 0219 08/22/19 0219 08/22/19 2338 08/23/19 0544 08/23/19 0940 08/24/19 0946 08/25/19 0312  NA 136  136 135 134*  135 134* 128* 133* 135 136 136  K 4.4  4.4 5.4* 5.3*  5.3* 6.2* 7.4* >7.5* 4.4 5.2* 5.8*  CL 92*  92* 90* 92*  93* 89*  --  88*  --  93* 92*  CO2 22  21* 21* 21*  22 15*  --  13*  --  20* 18*  GLUCOSE 191*  191* 110* 117*  120* 74  --  112*  --  104* 76  BUN 109*  104* 179* 91*  90* 148*  --  195*  --  85* 108*  CREATININE 6.79*  6.70* 10.11* 6.93*  6.85* 9.74*  --  13.02*  --  7.36* 8.72*  CALCIUM 9.7  9.6 9.5 9.3  9.4 9.4  --  8.6*  --  8.3* 8.3*  MG 2.8*  --   --   --   --   --   --   --   --   PHOS 6.3* 9.6* 8.2*  --   --  >30.0*  --   --   --     CBC: Recent Labs  Lab 08/19/19 0220 08/25/2019 0239 08/20/2019 0219 08/22/19 2338 08/23/19 0544 08/23/19 0940  WBC 11.5* 11.4* 11.6*  --  12.7*  --   HGB 10.6* 10.1* 10.6* 10.2* 9.3* 10.5*  HCT 34.2*  32.4* 35.0* 30.0* 28.9* 31.0*  MCV 90.2 90.0 91.1  --  90.3  --   PLT 481* 456* 401*  --  377  --      Coagulation Studies: No results for input(s): LABPROT, INR in the last 72 hours.  Imaging  MRI brain without contrast 08/06/2019:Diffuse hypoxic ischemic injury most pronounced affecting the cerebral hemispheric cortex but with some involvement also of the basal ganglia and thalami.   ASSESSMENT AND PLAN:48 year old male status post cardiac arrest and tracheostomy now with frequent seizure-like activity.   Cardiac arrest Anoxic brain injury Status post trach Seizures Encephalopathy secondary to diffuse anoxic brain injury -Patient started having seizures on 08/19/2019.  Since then we have added multiple AEDs including Keppra, valproic acid, Klonopin and Vimpat without resolution of seizures.  -I suspect the seizures will be very difficult to control due to the extent of severe hypoxic/anoxic brain injury.  Recommendations - Contiue LEV 1 g twice daily, unable to increase dose further due to poor renal function -Continue Vimpat to 200 mg twice daily, clonazepam to  3 mg 3 times daily,  Depakote to [phone]   - VPA 75, ammonia 14. Can increase VPA to 1000mg  TID if seizures recur.  - Given the extent of his brain injury and difficult to control seizures, his chances of neurologic recovery is poor.  -Will continue to discuss with wife and family again regarding goals of care -Seizure precautions - PRN IV Ativan 2 mg only to be given for generalized tonic-clonic seizure lasting more than 3 minutes.    CRITICAL CARE Performed by: Lora Havens  Total critical care time: 35 minutes  Critical care time was exclusive of separately billable procedures and treating other patients.  Critical care was necessary to treat or prevent imminent or life-threatening deterioration.  Critical care was time spent personally by me on the following activities: development of  treatment plan with patient and/or surrogate as well as nursing, discussions with consultants, evaluation of patient's response to treatment, examination of patient, obtaining history from patient or surrogate, ordering and performing treatments and interventions, ordering and review of laboratory studies, ordering and review of radiographic studies, pulse oximetry and re-evaluation of patient's condition.

## 2019-08-25 NOTE — Progress Notes (Signed)
Assisted tele visit to patient with wife.  Larry Banks, [name], RN

## 2019-08-25 NOTE — Progress Notes (Signed)
Patient ID: Larry Banks, male   DOB: 1971/08/06, 48 y.o.   MRN: [id number]  This NP visited patient at the bedside as a follow up for palliative medicine  needs and emotional support and for scheduled 1030 meeting for continued conversation regarding current medical situation, goals of care, end-of-life wishes, disposition and options.  I met  with wife/Mrs. Golson and the patient's sister Kary Kos #[phone], Dr Nelda Marseille and bedside RN/Kara who all participated in conversation.  Mrs Summer daughter/Alicia was on speaker phone for this meeting  Dr. Nelda Marseille once again outlined the medical situation; the patient's diffuse ischemic brain injury, overall failure to thrive and the grim prognosis.  Clarified with the family that specialists, nephrology and neurology agree that in spite of medical interventions unfortunately Mr. Brame has poor neurologic prognosis for meaningful recovery.  Dr. Nelda Marseille outlined the importance of focusing on the patient's comfort and dignity, not escalating care (patient is now DNR/DNI and dialysis will not be continued) encouraging family to come together supporting each other as they prepare for the patient's end-of-life.  Dr. Nelda Marseille presented to family that they do have the prerogative to have patient transferred to another facility if that is their wish.  That transfer would be family responsibility.  Kary Kos and daughter [name] verbalize a clear understanding of the information that Dr. Nelda Marseille has presented.  They understand that the patient has had a serious anoxic brain injury, that he is unable to breathe on his own, that he has end-stage renal disease, is high risk for ongoing seizure and end-of-life is eminent in spite of current medical interventions.    They attempt to help the wife understand that a transfer to any facility would not change the outcome.  [name] states "a broken bone is a broken bone whether it is here or another  hospital".     This analogy seems to help Mrs. Dillenbeck come to a deeper understanding of her husband's condition.  At this time the family that is present Mrs. Craton, her daughter and the patient's sister Kary Kos understand that there will be no escalation of care and that the family will come together in a timely fashion, securing a plan for liberation from the ventilator and allowing nature to take its course.  Family is talking about bringing the patient's mother and other family members in tomorrow to see the patient.  Dr. Nelda Marseille assured the family that they will have the time they need to put a plan in place to ensure comfort, quality and dignity for an  end-of-life experience for both the patient and the family.  Emotional support offered.       Questions and concerns addressed   Discussed with Dr Nelda Marseille  Total time spent on the unit was 60 minutes  Greater than 50% of the time was spent in counseling and coordination of care  Wadie Lessen NP  Palliative Medicine Team Team Phone # [phone] Pager [phone]

## 2019-08-25 NOTE — Progress Notes (Signed)
RT NOTES: Tried patient on pressure support, patient went apneic. Placed back on full support at this time.

## 2019-08-26 DIAGNOSIS — L8990 Pressure ulcer of unspecified site, unspecified stage: Secondary | ICD-10-CM | POA: Insufficient documentation

## 2019-08-26 LAB — CULTURE, RESPIRATORY W GRAM STAIN: Culture: NORMAL

## 2019-08-26 LAB — VALPROIC ACID LEVEL: Valproic Acid Lvl: 87 ug/mL (ref 50.0–100.0)

## 2019-08-26 LAB — GLUCOSE, CAPILLARY: Glucose-Capillary: 94 mg/dL (ref 70–99)

## 2019-08-26 MED ORDER — DEXTROSE 5 % IV SOLN
INTRAVENOUS | Status: DC
  Administered 2019-08-26: 12:00:00 via INTRAVENOUS

## 2019-08-26 MED ORDER — LORAZEPAM 2 MG/ML IJ SOLN
4.0000 mg | Freq: Once | INTRAMUSCULAR | Status: AC
  Administered 2019-08-26: 4 mg via INTRAVENOUS

## 2019-08-26 MED ORDER — GLYCOPYRROLATE 0.2 MG/ML IJ SOLN: 0.2000 mg | INTRAMUSCULAR | Status: DC | PRN

## 2019-08-26 MED ORDER — MORPHINE SULFATE (PF) 2 MG/ML IV SOLN: 2.0000 mg | INTRAVENOUS | Status: DC | PRN

## 2019-08-26 MED ORDER — ACETAMINOPHEN 325 MG PO TABS: 650.0000 mg | ORAL_TABLET | Freq: Four times a day (QID) | ORAL | Status: DC | PRN

## 2019-08-26 MED ORDER — ACETAMINOPHEN 650 MG RE SUPP: 650.0000 mg | Freq: Four times a day (QID) | RECTAL | Status: DC | PRN

## 2019-08-26 MED ORDER — DIPHENHYDRAMINE HCL 50 MG/ML IJ SOLN: 25.0000 mg | INTRAMUSCULAR | Status: DC | PRN

## 2019-08-26 MED ORDER — MORPHINE 100MG IN NS 100ML (1MG/ML) PREMIX INFUSION
0.0000 mg/h | INTRAVENOUS | Status: DC
  Administered 2019-08-26: 5 mg/h via INTRAVENOUS
  Filled 2019-08-26: qty 100

## 2019-08-26 MED ORDER — GLYCOPYRROLATE 1 MG PO TABS
1.0000 mg | ORAL_TABLET | ORAL | Status: DC | PRN
  Filled 2019-08-26: qty 1

## 2019-08-26 MED ORDER — MORPHINE BOLUS VIA INFUSION
5.0000 mg | INTRAVENOUS | Status: DC | PRN
  Filled 2019-08-26: qty 5

## 2019-08-26 MED ORDER — POLYVINYL ALCOHOL 1.4 % OP SOLN
1.0000 [drp] | Freq: Four times a day (QID) | OPHTHALMIC | Status: DC | PRN
  Filled 2019-08-26: qty 15

## 2019-09-03 NOTE — Death Summary Note (Addendum)
DEATH SUMMARY   Patient Details  Name: Larry Banks MRN: [id number] DOB: October 29, 1971  Admission/Discharge Information   Admit Date:  08-19-2019  Date of Death: Date of Death: 09/13/19  Time of Death: Time of Death: 02-25-1444  Length of Stay: 2023/03/15  Referring Physician: Curlene Labrum, MD   Reason(s) for Hospitalization  PEA arrest c/b anoxic brain injury, recurrent focal seizures, and vent dependent resp failure  Diagnoses  Preliminary cause of death:  Secondary Diagnoses (including complications and co-morbidities):  Active Problems:   Cardiac arrest ([place])   DNR (do not resuscitate) discussion   Acute respiratory failure ([place])   Ventilator dependence ([place])   ESRD needing dialysis ([place])   Anoxic brain damage ([place])   Seizures ([place])   Acute pulmonary edema ([place])   Tracheostomy status ([place])   Palliative care by specialist   Pressure injury of skin   Brief Hospital Course (including significant findings, care, treatment, and services provided and events leading to death)  Larry Banks is Larry Banks 48 y.o. year old male with Larry Banks hx of ESRD on dialysis, PAD, HLD, HTN who presented to [facility] after 2 days of SOB and subsequently had PEA arrest in ED.  Had ROSC after 15 min of CPR and [facility].  Initally required pressors and HD cath placed on 8/26.   CT of the head was negative.  CT of the abdomen pelvis initially showed bilateral lower lobes pleural effusions nonobstructive renal stone.  Echocardiogram showed ejection fraction 45-50% with hypokinesis.  EEG showed profound slowing of brain activity but no seizure.  CT of the head repeated on 9/1 showed severe diffuse brain edema with widespread hypoxic injury.  Repeat EEG 9/2 showed profound diffuse encephalopathy secondary to hypoxic brain injury.  MRI brain 9/3 showed hypoxic diffuse brain injury.  Most of the cultures remain negative except sputum culture showed above-noted gram-negative rods.  Initially  has received Unasyn, cefepime and vancomycin but currently off antibiotics.  Intubated from August 19, 2023 to 9/10.  Having recurrent seizures secondary to anoxic brain injury.  Difficult to control.  Critical care met with patient and family has made patient DNR with no escalation of care.  Critical care has had discussions with family, palliative, and ethics.  Care was withdrawn on 09/13/23 based on those discussions.  See prior notes for additional details.   Pt seen today prior to withdrawal of care.  Pt on vent, blinking spontaneously, but no other spontaneous movements.  Did not withdrawal from pain.  Discussed plan of care with critical care.  Pertinent Labs and Studies  Significant Diagnostic Studies Ct Abdomen Pelvis Wo Contrast  Result Date: 2019-08-19 CLINICAL DATA:  48 year old male with abdominal pain and distension. Patient with recent cardiac arrest. Peritoneal dialysis catheter placement . EXAM: CT ABDOMEN AND PELVIS WITHOUT CONTRAST TECHNIQUE: Multidetector CT imaging of the abdomen and pelvis was performed following the standard protocol without IV contrast. COMPARISON:  None. FINDINGS: Please note that parenchymal abnormalities may be missed without intravenous contrast. Lower chest: Cardiomegaly is identified. Moderate bilateral LOWER lung atelectasis/consolidation noted with small bilateral pleural effusions. Ground-glass opacities within the aerated LOWER lungs are nonspecific. Hepatobiliary: The liver and gallbladder are unremarkable. No biliary dilatation. Pancreas: Unremarkable Spleen: Unremarkable Adrenals/Urinary Tract: Punctate nonobstructing bilateral renal calculi are identified as well as Larry Banks LEFT renal cyst. There is no evidence of hydronephrosis or obstructing urinary calculi. Larry Banks Foley catheter is present within the bladder. Fullness of bilateral adrenal glands noted without discrete  nodule/mass. Stomach/Bowel: An NG tube is present with tip in the mid stomach. There is no evidence of bowel  obstruction, bowel wall thickening or bowel inflammatory changes. The appendix is normal. Vascular/Lymphatic: Aortic atherosclerosis. No enlarged abdominal or pelvic lymph nodes. Reproductive: Prostate is unremarkable. Other: Larry Banks peritoneal dialysis catheter is identified with tip in the RIGHT pelvis. No ascites, pneumoperitoneum or focal collection. Mild diffuse subcutaneous edema is noted. Musculoskeletal: No acute or suspicious bony abnormalities. IMPRESSION: 1. Peritoneal dialysis catheter with tip in the RIGHT pelvis. No complicating features. 2. Moderate bilateral LOWER lung atelectasis/consolidation and small bilateral pleural effusions. Mild subcutaneous edema. 3. Nonspecific diffuse bilateral ground-glass opacities within the visualized aerated LOWER lungs, question edema versus infection. 4. Punctate nonobstructing bilateral renal calculi. 5. Cardiomegaly 6.  Aortic Atherosclerosis (ICD10-I70.0). Electronically Signed   By: Margarette Canada M.D.   On: 07/17/2019 13:56   Dg Chest 1 View  Result Date: 08/08/2019 CLINICAL DATA:  Fever. Respiratory distress. EXAM: CHEST  1 VIEW COMPARISON:  August 07, 2019 FINDINGS: Stable support apparatus. Cardiomediastinal silhouette is normal. Mediastinal contours appear intact. Low lung volumes. Mild vascular congestion. Osseous structures are without acute abnormality. Soft tissues are grossly normal. IMPRESSION: Low lung volumes with mild vascular congestion. Electronically Signed   By: Fidela Salisbury M.D.   On: 08/08/2019 13:01   Ct Head Wo Contrast  Result Date: 08/04/2019 CLINICAL DATA:  Cardiac arrest follow-up EXAM: CT HEAD WITHOUT CONTRAST TECHNIQUE: Contiguous axial images were obtained from the base of the skull through the vertex without intravenous contrast. COMPARISON:  CT 07/22/2019 FINDINGS: Brain: Interval development of diffuse brain edema with loss of gray-white differentiation and effacement of sulci. Decreased ventricular size compared to the  prior study. Edema seen throughout both cerebral hemispheres and in the cerebellum bilaterally. Partial effacement of basilar cisterns. No herniation. Negative for hemorrhage. Vascular: Negative for hyperdense vessel Skull: Negative Sinuses/Orbits: Air-fluid levels paranasal sinuses.  Negative orbit Other: None IMPRESSION: Interval development of severe diffuse brain edema compatible with widespread hypoxic injury. Negative for hemorrhage. These results were called by telephone at the time of interpretation on 08/04/2019 at 4:37 pm to Rexene Edison, NP , who verbally acknowledged these results. Electronically Signed   By: Franchot Gallo M.D.   On: 08/04/2019 16:46   Ct Head Wo Contrast  Result Date: 07/30/2019 CLINICAL DATA:  Cardiac arrest. EXAM: CT HEAD WITHOUT CONTRAST TECHNIQUE: Contiguous axial images were obtained from the base of the skull through the vertex without intravenous contrast. COMPARISON:  None. FINDINGS: Brain: No evidence of generalized brain atrophy or generalized brain swelling. No sign of old or acute focal infarction, mass lesion, hemorrhage, hydrocephalus or extra-axial collection. Vascular: No abnormal vascular finding. Skull: Normal Sinuses/Orbits: Mild sinus mucosal thickening. Tiny right maxillary sinus air-fluid level. Orbits negative. Other: None IMPRESSION: No focal brain insult. No evidence of diffuse hypoxic ischemic injury. Normal CT at this time. Electronically Signed   By: [name] Chimes M.D.   On: 07/26/2019 13:50   Mr Brain Wo Contrast  Result Date: 08/06/2019 CLINICAL DATA:  Follow-up cardiac arrest. EXAM: MRI HEAD WITHOUT CONTRAST TECHNIQUE: Multiplanar, multiecho pulse sequences of the brain and surrounding structures were obtained without intravenous contrast. COMPARISON:  Head CT 08/04/2019 FINDINGS: Brain: Today's study shows widespread hypoxic ischemic injury to the cerebral hemispheric cortex, basal ganglia and thalami. No evidence of mass effect or herniation. No  hemorrhage. No hydrocephalus or extra-axial collection. Vascular: Major vessels at the base of the brain show flow. Skull and upper cervical  spine: Negative Sinuses/Orbits: Mucosal thickening and fluid within the paranasal sinuses, particularly the sphenoid sinus. Orbits negative. Bilateral mastoid effusions. Other: None IMPRESSION: Diffuse hypoxic ischemic injury most pronounced affecting the cerebral hemispheric cortex but with some involvement also of the basal ganglia and thalami. Electronically Signed   By: [name] Chimes M.D.   On: 08/06/2019 13:55   Dg Chest Port 1 View  Result Date: 08/22/2019 CLINICAL DATA:  Respiratory failure with hypoxia EXAM: PORTABLE CHEST 1 VIEW COMPARISON:  08/19/2019, 08/16/2019 FINDINGS: Tracheostomy tube remains in place. Esophageal tube tip is below the diaphragm but non included. Right-sided central venous catheter tip over the right atrium. Interval diffuse opacity/white out of left thorax. Shift of mediastinal contents to the left, compatible with component of volume loss. Cardiomediastinal silhouette is obscured. Aortic atherosclerosis. No pneumothorax. IMPRESSION: 1. Interim development of diffuse white out/opacity of the left thorax. Mediastinal shift to the left suggests at least some component of volume loss and atelectasis. Electronically Signed   By: Donavan Foil M.D.   On: 08/22/2019 23:14   Dg Chest Port 1 View  Result Date: 08/19/2019 CLINICAL DATA:  Tracheostomy/ventilation present, acute respiratory failure. EXAM: PORTABLE CHEST 1 VIEW COMPARISON:  08/16/2019 FINDINGS: Tracheostomy tube unchanged. Enteric tube courses into the stomach and off the film as tip is not visualized. Right IJ central venous catheter unchanged with tip over the right atrium. Lungs are hypoinflated and otherwise clear. Cardiomediastinal silhouette and remainder of the exam is unchanged. IMPRESSION: Hypoinflation without acute cardiopulmonary disease. Tubes and lines as described.  Electronically Signed   By: Marin Olp M.D.   On: 08/19/2019 07:24   Dg Chest Port 1 View  Result Date: 08/16/2019 CLINICAL DATA:  Fevers EXAM: PORTABLE CHEST 1 VIEW COMPARISON:  08/14/2019 FINDINGS: Tracheostomy tube, feeding catheter and dialysis catheter are noted. Feeding catheter is new from the prior exam. The tip is not visualized on this exam. The lungs are clear. Cardiac shadow is stable. Aortic calcifications are again seen. No bony abnormality is noted. IMPRESSION: New feeding catheter extending into the stomach. No acute abnormality is seen. Electronically Signed   By: Inez Catalina M.D.   On: 08/16/2019 19:09   Dg Chest Port 1 View  Result Date: 08/14/2019 CLINICAL DATA:  Acute respiratory failure with hypoxia EXAM: PORTABLE CHEST 1 VIEW COMPARISON:  08/12/2019 FINDINGS: Interval placement of tracheostomy in good position. NG removed. Right jugular dual lumen catheter tip in the right atrium unchanged. No pneumothorax Improved lung volumes with improvement in bibasilar atelectasis. No edema or effusion. IMPRESSION: Satisfactory tracheostomy placement Improved aeration in the lung bases. Electronically Signed   By: Franchot Gallo M.D.   On: 08/14/2019 07:59   Dg Chest Port 1 View  Result Date: 08/12/2019 CLINICAL DATA:  Acute respiratory failure EXAM: PORTABLE CHEST 1 VIEW COMPARISON:  08/08/2011 FINDINGS: Cardiac shadow is mildly prominent but stable. Endotracheal tube and gastric catheter are again seen and stable. Dialysis catheter is again noted. The overall inspiratory effort is poor with crowding of the vascular markings. No focal infiltrate or effusion is seen. IMPRESSION: Tubes and lines as described. Overall poor inspiratory effort. Electronically Signed   By: Inez Catalina M.D.   On: 08/12/2019 07:14   Dg Chest Port 1 View  Result Date: 08/07/2019 CLINICAL DATA:  Acute respiratory failure EXAM: PORTABLE CHEST 1 VIEW COMPARISON:  08/06/2019 FINDINGS: Cardiac shadow is stable.  Endotracheal tube and gastric catheter are again seen and stable. Left subclavian line and right jugular dialysis catheter are  again noted and stable. The overall inspiratory effort is again poor although improved aeration in the bases is seen. Mild central vascular congestion is noted. IMPRESSION: Mild vascular congestion. Slight improved aeration in the bases bilaterally despite Larry Banks poor inspiratory effort. Tubes and lines as described. Electronically Signed   By: Inez Catalina M.D.   On: 08/07/2019 06:50   Dg Chest Port 1 View  Result Date: 08/06/2019 CLINICAL DATA:  48 year old male with respiratory failure. EXAM: PORTABLE CHEST 1 VIEW COMPARISON:  Chest radiograph dated 08/05/2019 FINDINGS: Support lines and tubes in similar position. There is shallow inspiration with bibasilar atelectasis. Small bilateral pleural effusions may be present. No pneumothorax. Stable cardiomegaly. Atherosclerotic calcification of the aorta. No acute osseous pathology. IMPRESSION: No significant interval change. Electronically Signed   By: Anner Crete M.D.   On: 08/06/2019 08:31   Dg Chest Port 1 View  Result Date: 08/05/2019 CLINICAL DATA:  Acute respiratory failure. End-stage renal disease. EXAM: PORTABLE CHEST 1 VIEW COMPARISON:  08/04/2019 FINDINGS: Stable enlarged cardiac silhouette. Endotracheal tube in satisfactory position. Right jugular catheter tip in the right atrium. Letzy Gullickson poor inspiration is again demonstrated with increased patchy opacity at the right lung base and decreased patchy and linear density at the left lung base. Mild-to-moderate diffuse peribronchial thickening is again demonstrated with decreased prominence of the interstitial markings. No visible pleural fluid. Unremarkable bones. IMPRESSION: 1. Bibasilar atelectasis, greater on the right and decreased on the left. Pneumonia can not be excluded at the right lung base. 2. Stable cardiomegaly. Electronically Signed   By: Claudie Revering M.D.   On:  08/05/2019 08:15   Dg Chest Port 1 View  Result Date: 08/04/2019 CLINICAL DATA:  Intubated patient. Acute hypoxic respiratory failure in the setting of respiratory failure. EXAM: PORTABLE CHEST 1 VIEW COMPARISON:  Single view of the chest 08/03/2019 and 07/06/2019. FINDINGS: Support tubes and lines are unchanged. Patchy left basilar airspace disease is new since yesterday's exam. The lungs are otherwise clear. Heart size is enlarged. No pneumothorax. IMPRESSION: New patchy left basilar airspace disease could be due to atelectasis or less likely, pneumonia. Cardiomegaly without edema. Electronically Signed   By: Inge Rise M.D.   On: 08/04/2019 08:47   Dg Chest Port 1 View  Result Date: 08/03/2019 CLINICAL DATA:  Endotracheal tube present.  Cardiac arrest. EXAM: PORTABLE CHEST 1 VIEW COMPARISON:  One-view chest x-ray 08/02/2019 FINDINGS: Heart size is exaggerated by low lung volumes. The endotracheal tube terminates 13 mm above the carina and could be pulled back 1-2 cm for more optimal positioning. Myrna Vonseggern left subclavian line is stable. Right IJ dialysis catheter is stable. Overall aeration is slightly improved. Lung volumes remain low. Right greater than left basilar airspace opacities remain. IMPRESSION: 1. Improving aeration of both lungs with persistent interstitial and airspace opacities, right greater than left. 2. Endotracheal tube is somewhat low, terminating only 1.3 cm above the carina. It could be pulled back 1-2 cm for more optimal positioning. 3. Cardiomegaly and stable mild pulmonary vascular congestion. 4. Aortic atherosclerosis. 5. Support apparatus is otherwise stable. Electronically Signed   By: San Morelle M.D.   On: 08/03/2019 06:04   Dg Chest Port 1 View  Result Date: 08/02/2019 CLINICAL DATA:  Ventilator support EXAM: PORTABLE CHEST 1 VIEW COMPARISON:  07/10/2019 FINDINGS: Endotracheal tube is 1 cm above the carina. Nasogastric tube enters the stomach. Left subclavian  central line tip in the SVC at the azygos level. Right internal jugular central line tip in the right  atrium. Diffuse pulmonary airspace filling persist consistent with edema and/or pneumonia. IMPRESSION: Lines and tubes unchanged in well-positioned. Diffuse pulmonary opacity consistent with edema and/or pneumonia. Electronically Signed   By: [name] Chimes M.D.   On: 08/02/2019 07:38   Dg Chest Port 1 View  Result Date: 07/21/2019 CLINICAL DATA:  Central line placement EXAM: PORTABLE CHEST 1 VIEW COMPARISON:  07/18/2019, 4:27 Rahi Chandonnet.m. FINDINGS: Interval placement of Caprina Wussow left subclavian vascular catheter, tip projecting over the superior SVC. Other support apparatus including endotracheal tube, esophagogastric tube, and right neck large bore multi lumen vascular catheter is unchanged. Otherwise unchanged examination featuring heterogeneous and interstitial bilateral pulmonary opacity, most conspicuous in the right midlung, consistent with edema or infection, and cardiomegaly. IMPRESSION: Interval placement of Erin Uecker left subclavian vascular catheter, tip projecting over the superior SVC. Electronically Signed   By: Eddie Candle M.D.   On: 07/30/2019 12:49   Dg Chest Port 1 View  Result Date: 07/10/2019 CLINICAL DATA:  Post intubation.  Respiratory distress. EXAM: PORTABLE CHEST 1 VIEW COMPARISON:  Report of 09/11/2005. FINDINGS: Dialysis catheter tip at low SVC or superior caval/atrial junction. Endotracheal tube terminates 2.7 cm above carina. Nasogastric tube is poorly visualized distally, likely extends beyond the inferior aspect of the film. Overlying pacer/defibrillator. Cardiomegaly accentuated by AP portable technique. No definite pleural fluid. Moderate interstitial edema is asymmetric and greater on the right. Right greater than left perihilar airspace opacities. IMPRESSION: Support apparatus as detailed above. Interstitial and airspace opacities. Favor pulmonary edema. Given lack of significant pleural fluid,  differential considerations include aspiration and infection, including atypical etiologies. Electronically Signed   By: Abigail Miyamoto M.D.   On: 07/08/2019 04:45    Microbiology Recent Results (from the past 240 hour(s))  Culture, respiratory (non-expectorated)     Status: None   Collection Time: 08/22/19 11:27 PM   Specimen: Tracheal Aspirate; Respiratory  Result Value Ref Range Status   Specimen Description TRACHEAL ASPIRATE  Final   Special Requests NONE  Final   Gram Stain   Final    FEW SQUAMOUS EPITHELIAL CELLS PRESENT MODERATE WBC PRESENT, PREDOMINANTLY PMN FEW GRAM POSITIVE COCCI MODERATE FEW GRAM POSITIVE RODS RARE GRAM NEGATIVE RODS    Culture   Final    MODERATE Consistent with normal respiratory flora. Performed at [facility], [place] [address], [place], [place] [address]    Report Status 09/04/19 FINAL  Final    Lab Basic Metabolic Panel: Recent Labs  Lab 08/25/2019 0537 08/23/2019 0219 08/22/19 0219 08/22/19 2338 08/23/19 0544 08/23/19 0940 08/24/19 0946 08/25/19 0312  NA 135 134*  135 134* 128* 133* 135 136 136  K 5.4* 5.3*  5.3* 6.2* 7.4* >7.5* 4.4 5.2* 5.8*  CL 90* 92*  93* 89*  --  88*  --  93* 92*  CO2 21* 21*  22 15*  --  13*  --  20* 18*  GLUCOSE 110* 117*  120* 74  --  112*  --  104* 76  BUN 179* 91*  90* 148*  --  195*  --  85* 108*  CREATININE 10.11* 6.93*  6.85* 9.74*  --  13.02*  --  7.36* 8.72*  CALCIUM 9.5 9.3  9.4 9.4  --  8.6*  --  8.3* 8.3*  PHOS 9.6* 8.2*  --   --  >30.0*  --   --   --    Liver Function Tests: Recent Labs  Lab 08/28/2019 0537 08/16/2019 0219 08/23/19 0544  ALBUMIN 3.1* 3.1* 3.0*  No results for input(s): LIPASE, AMYLASE in the last 168 hours. Recent Labs  Lab 08/25/19 0312  AMMONIA 14   CBC: Recent Labs  Lab 09/01/2019 0239 08/07/2019 0219 08/22/19 2338 08/23/19 0544 08/23/19 0940  WBC 11.4* 11.6*  --  12.7*  --   HGB 10.1* 10.6* 10.2* 9.3* 10.5*  HCT 32.4* 35.0* 30.0* 28.9* 31.0*  MCV 90.0  91.1  --  90.3  --   PLT 456* 401*  --  377  --    Cardiac Enzymes: No results for input(s): CKTOTAL, CKMB, CKMBINDEX, TROPONINI in the last 168 hours. Sepsis Labs: Recent Labs  Lab 08/06/2019 0239 08/09/2019 0219 08/23/19 0544  WBC 11.4* 11.6* 12.7*    Procedures/Operations  See prior notes   Fayrene Helper 09/01/19, 8:26 PM

## 2019-09-03 NOTE — Progress Notes (Signed)
RT note-Ventilator discontinued and patient removed from support per order and family wishes.

## 2019-09-03 NOTE — Progress Notes (Signed)
PULMONARY / CRITICAL CARE MEDICINE   NAME:  [facility], MRN:  [id number], DOB:  December 13, 1970, LOS: 8 ADMISSION DATE:  08/02/2019, CONSULTATION DATE:  07/27/2019  REFERRING MD:  EDP Dr. Tomi [facility]   CHIEF COMPLAINT:  Cardiac Arrest/Resp Failure   BRIEF HISTORY:    48 year old male with medical history significant for hypertension, PAD, hyperlipidemia, CKD Stage 5 developed shortness of breath at home.  Patient does have CKD Stage 5 .  Is followed at [facility]  Recently had peritoneal dialysis catheter placed on August 27. Has not started Dialysis yet.  On arrival to the emergency room on 8/29  patient suffered a cardiac arrest with asystole.  He received CPR x15 minutes.  He receieved hyperkalemic protocol due to his underlying chronic kidney disease with 1 amp bicarb, 1 amp D50, insulin 10 units regular IV.  He received epi x3.  He had ROSC after 15 minutes.  He did require very brief Neo-Synephrine for hypotension.  He had significant agitation was started on sedation with Versed and Fentanyl.    Patient was transported to [facility] for [name] to admit.  Remains intubated without significant neurologic recovery. EEG and CT suggestive of severe anoxic brain injury  SIGNIFICANT PAST MEDICAL HISTORY   Hypertension, hyperlipidemia, PAD, chronic kidney disease stage V  SIGNIFICANT EVENTS:  8/26-  TTS HD - last 07/29/2019 [facility] (old tunneled Rt HD cah) 8/27 - PD cath - for a planned change from iHD to PD 8/29- Cardiac arrest, CPR x15 minutes, epi x3. Per renal CXR- c/w pulmonary edema 8/31- 1 unit PRBC, paralyzed due to vent dysynchrony 9/1- off pressors 9/5 antibiotics restarted- fevers, leukocytosis, arrhythmias  STUDIES:   8/29 CT head  > no acute abnormality 8/29 CT abdomen pelvis > bilateral lower lobe consolidation with small pleural effusion.  Groundglass opacities lower portion of the lung.  Nonobstructive renal calculus, 8/29 Echo > ef 45-50%, hypokineses of the mid-inferior  and lateral wall 8/29 - EEG> diffuse slowing and generalized suppression suggestive of profound encephalopathy, no seizure activity 9/1 CT head>severe diffuse brain edema compatible with widespread hypoxic injury 9/2 EEG> suggestive ofprofound diffuse encephalopathy,could besecondary to anoxic/hypoxic brain injury.No seizures or epileptiform discharges were seen throughout the recording.  9/3 MRI brain>Diffuse hypoxic ischemic injury most pronounced affecting the cerebral hemispheric cortex but with some involvement also of the basal ganglia and thalami. 9/4 & 9/5: CXR- no opacities 9/13: CXR-no focal infiltrates or opacities.New feeding catheter extending into the stomach. No acute abnormality is seen. 9/16 CXR: no acute changes  CULTURES:  8/29 BC >>Negative  8/29 Sputum >> Rare GNR, GPC>> Normal [place] 8/29 COVID 19 >> NEG  MRSA surveillance negative  9/5 blood culture >>NGTD 9/5 urine culture >> negative 9/13 Sputum>> abundant GNR, rare GPC 9/13 Blood>>   ANTIBIOTICS:  Unasyn 8/29 >9/1 Cefepime 9/5> 9/11 vanc 9/5 > 9/9  LINES/TUBES:  Peritoneal cath 8/27 (PTA )  R HD cath ? >>  ------- 8/29 ETT >> 9/10 Trach ([facility]) 9/10 >>  8/29 L IJ CVL> 9/4 8/29 A line>>8/30 No foley. Unclear when D/ced.   CONSULTANTS:  Nephrology  Neurology  No events overnight, breathing over the vent  CONSTITUTIONAL: BP 126/77   Pulse 97   Temp 97.6 F (36.4 C) (Oral)   Resp (!) 34   Ht 5\' 6"  (1.676 m)   Wt 79.5 kg   SpO2 100%   BMI 28.29 kg/m   I/O last 3 completed shifts: In: [id number] [I.V.:280.6; NG/GT:90; IV Piggyback:743.4]  Out: -   PE General: Chronically ill appearing male, completely unresponsive HENT: [place]/AT, PERRL, EOM-I and MMM, trach in place CV: IRIR, Nl S1/S2 and -M/R/G Lungs: Coarse BS diffusely ABD: Soft, NT, ND and +BS Skin: Intact Neuro: Comatose, not following commands or responding to pain  I reviewed CXR myself, trach is in a good position  RESOLVED  PROBLEM LIST  Discussed with neuro-MD and bedside RN  ASSESSMENT AND PLAN   48 year old male with history of ESRD on dialysis who presented to [facility] for 2d history of SOB and subsequently had PEA cardiac arrest while in ED. ROSC achieved after 58min of CPR and was transferred to [facility] ICU for further management.  PEA cardiac arrest in [facility]. ROSC achieved after 76min of CPR. Etiology of arrest unclear. Transitioned from HD to PD and had missed dialysis the day of admission, presenting K was WNL.  S/p 36 degree TTM.    - D/C tele monitoring - Awaiting wife for withdrawal - D/C anti-HTN - Tele monitoring - No changes in trach size or type - Morphine and d/c vent  Malnutrition - D/C TF  Acute hypoxic respiratory failure. Trached on 9/10 for prolonged ETT time. CXR today neg for acute changes. - Standard trach care - D/C vent  Encephalopathy. EEG most consistent with anoxic brain injury. MRI with restricted diffusion throughout the entire cortex. - Continue anti-epileptics but no other sedatives for comfort at this point  Rhythmic L hand movements: renally dosed keppra   ESRD stage 5, urinary retention requiring frequent straight catheterization. Hyperphosphatemia. -PD cath placed the end of august however is no longer a candidate in light of anoxic brain injury -MWF HD schedule P: - Keep PD cath for now, no changes - Continue binders for hyperphosphatemia  Anemia of chronic disease and iron deficiency-stable. Avoid iron infusions 2/2 possible reaction/angioadema after receiving a dose  Hypertension-improved. Continue amlodipine and metoprolol  Type 2 DM, uncontrolled, but improved. Continue same Lantus 30 + SSI  Wife to arrive today for withdrawal.  [name] will sign off, discussed with [facility]  The patient is critically ill with multiple organ systems failure and requires high complexity decision making for assessment and support, frequent evaluation and titration of  therapies, application of advanced monitoring technologies and extensive interpretation of multiple databases.   Critical Care Time devoted to patient care services described in this note is  33  Minutes. This time reflects time of care of this signee Dr Jennet Maduro. This critical care time does not reflect procedure time, or teaching time or supervisory time of PA/NP/Med student/Med Resident etc but could involve care discussion time.  Rush Farmer, M.D. [facility] Pulmonary/Critical Care Medicine. Pager: [phone]. After hours pager: [phone].

## 2019-09-03 NOTE — Progress Notes (Signed)
See death summary

## 2019-09-03 DEATH — deceased

## 2020-07-01 IMAGING — DX DG CHEST 1V PORT
1 series · 1 of 1 positions shown · non-contrast
Comparison: Chest radiograph dated 08/05/2019

CLINICAL DATA: 48-year-old male with respiratory failure.

EXAM:
PORTABLE CHEST 1 VIEW

[chest]
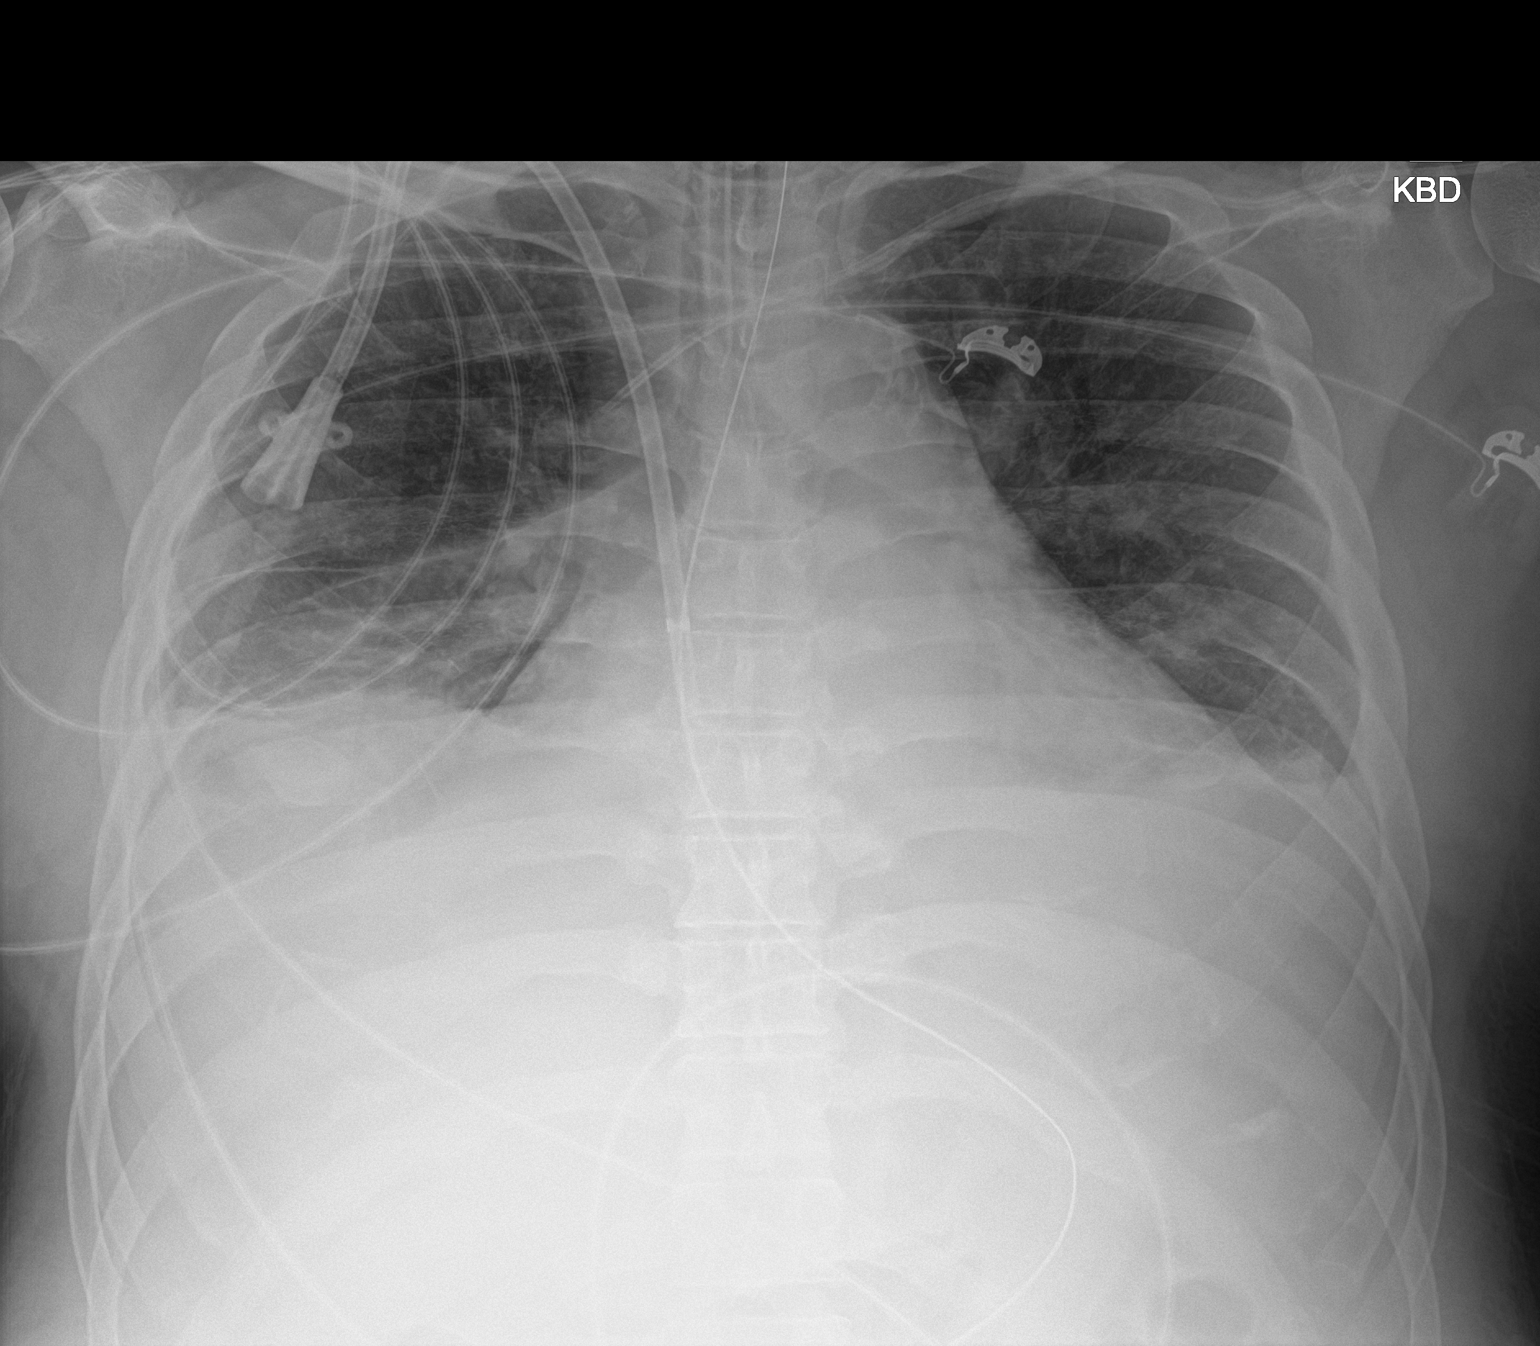

[1 of 1 positions shown; findings below may reference images not displayed]

FINDINGS: Support lines and tubes in similar position. There is shallow
inspiration with bibasilar atelectasis. Small bilateral pleural
effusions may be present. No pneumothorax. Stable cardiomegaly.
Atherosclerotic calcification of the aorta. No acute osseous
pathology.
IMPRESSION: No significant interval change.

## 2020-07-01 IMAGING — MR MR HEAD W/O CM
9 of 11 series · 33 of 48 positions shown · non-contrast
Comparison: Head CT 08/04/2019

CLINICAL DATA: Follow-up cardiac arrest.

EXAM:
MRI HEAD WITHOUT CONTRAST
TECHNIQUE: Multiplanar, multiecho pulse sequences of the brain and surrounding
structures were obtained without intravenous contrast.

[Series 3: DWI · axial · 3.0mm · 0.94mm/px · z∈[-100,+59]mm · 8 of 108 slices shown (1 of 2)]
[im 1/108]
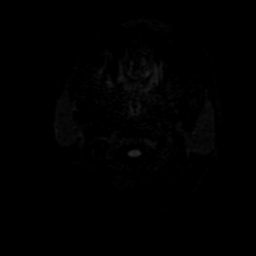
[im 16/108]
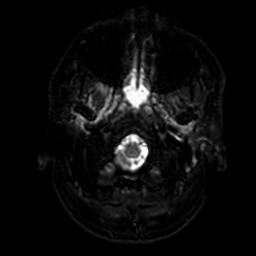
[im 31/108]
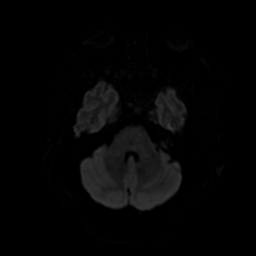
[im 46/108]
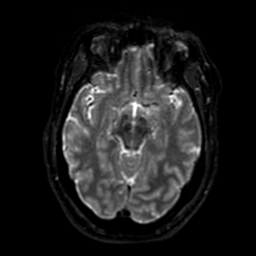
[im 62/108]
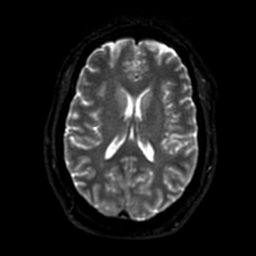
[im 77/108]
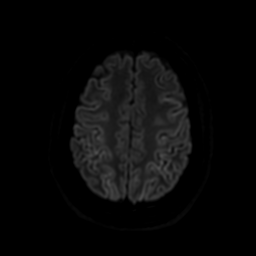
[im 92/108]
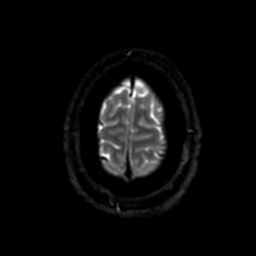
[im 108/108]
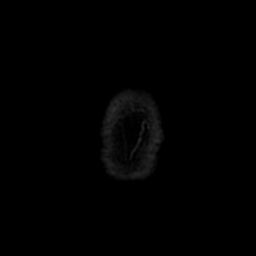

[Series 4: DWI · coronal · 4.0mm · 0.94mm/px · 5 of 72 slices shown (2 of 2)]
[im 1/72]
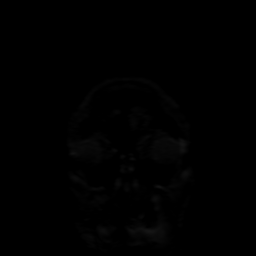
[im 18/72]
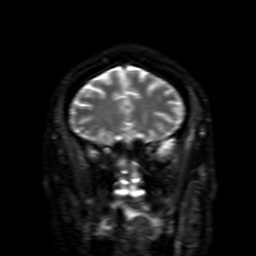
[im 36/72]
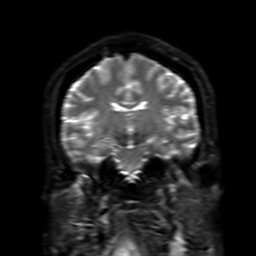
[im 54/72]
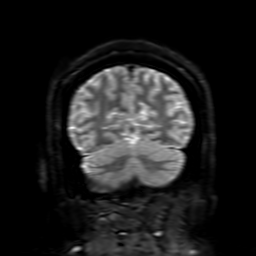
[im 72/72]
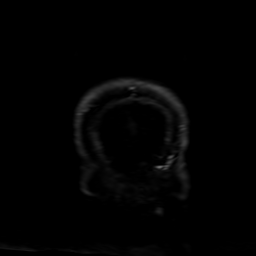

[Series 5: FLAIR · sagittal · 5.0mm · 0.47mm/px · 2 of 26 slices shown (1 of 2)]
[im 1/26]
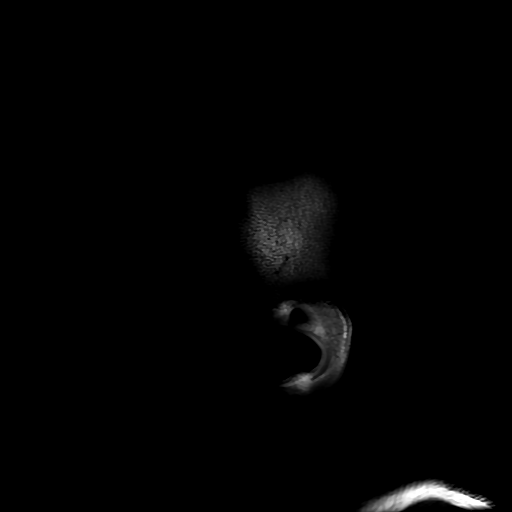
[im 26/26]
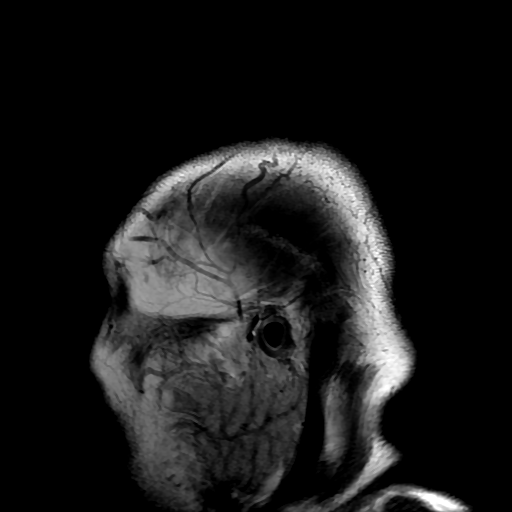

[Series 6: T2 · axial · 5.0mm · 0.47mm/px · z∈[-99,+69]mm · 2 of 29 slices shown (1 of 2)]
[im 1/29]
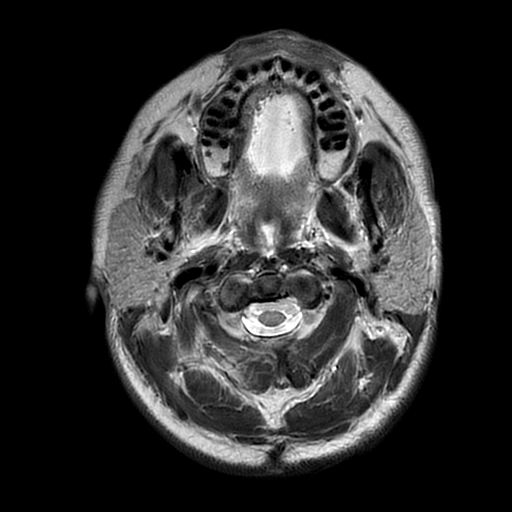
[im 29/29]
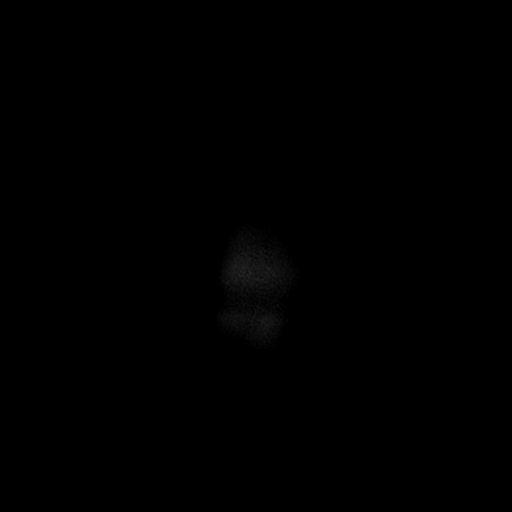

[Series 7: FLAIR · axial · 5.0mm · 0.47mm/px · z∈[-95,+67]mm · 2 of 28 slices shown (2 of 2)]
[im 1/28]
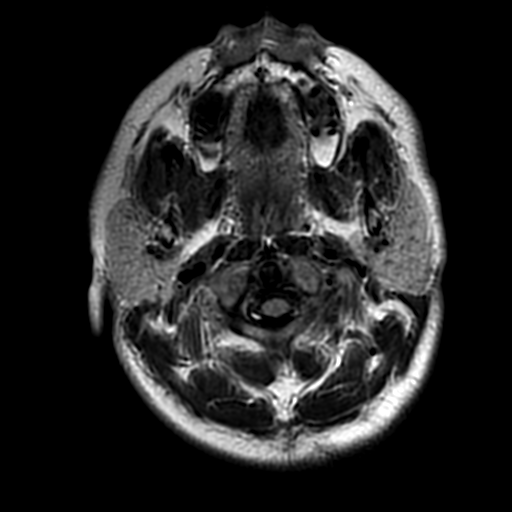
[im 28/28]
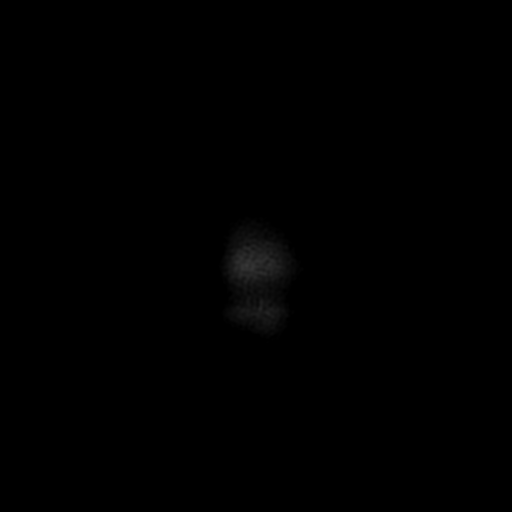

[Series 8: SWI · axial · 3.0mm · 0.47mm/px · z∈[-85,-1]mm · 5 of 100 slices shown]
[im 1/100]
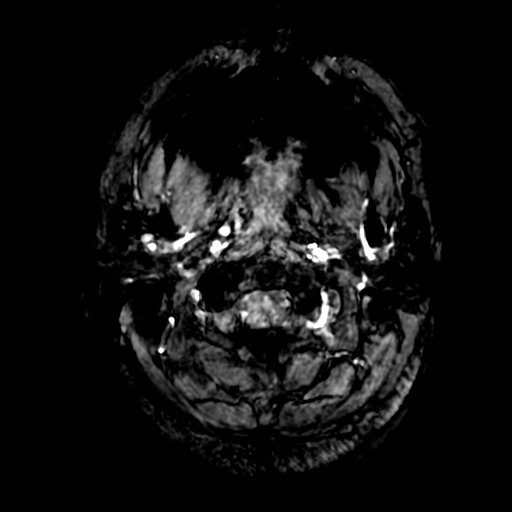
[im 15/100]
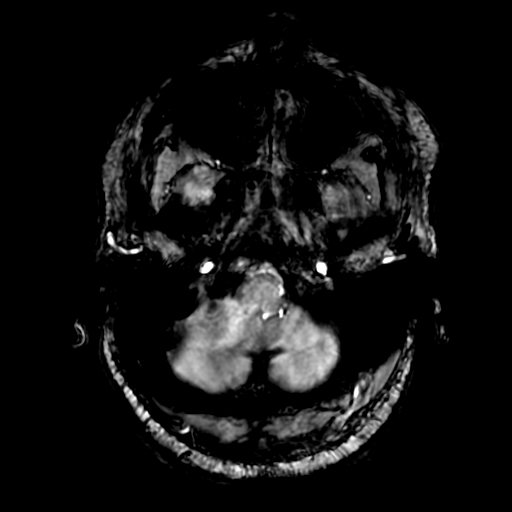
[im 29/100]
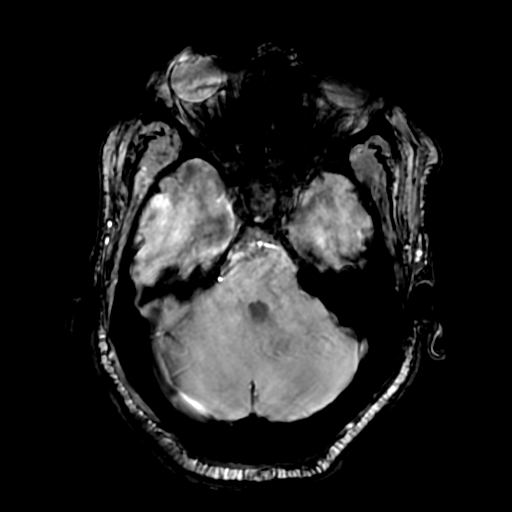
[im 43/100]
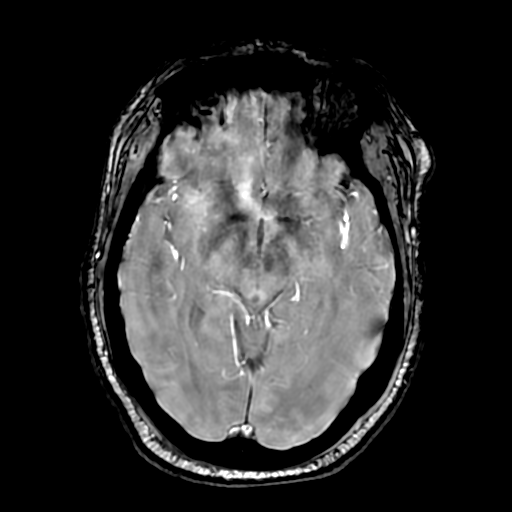
[im 57/100]
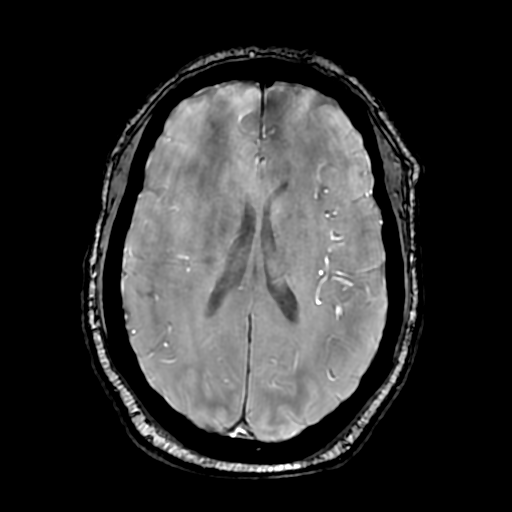

[Series 10: T2 · coronal · 5.0mm · 0.47mm/px · 2 of 31 slices shown (2 of 2)]
[im 1/31]
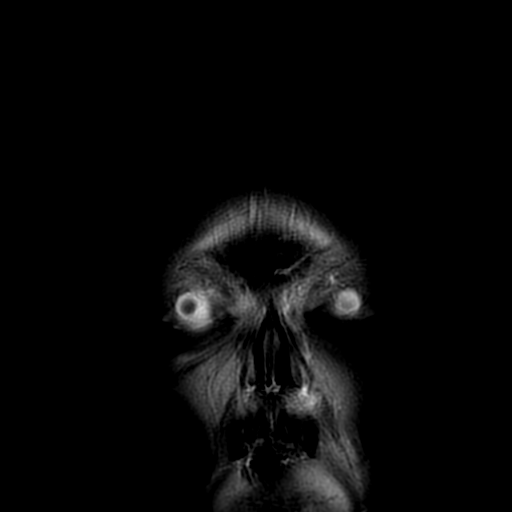
[im 31/31]
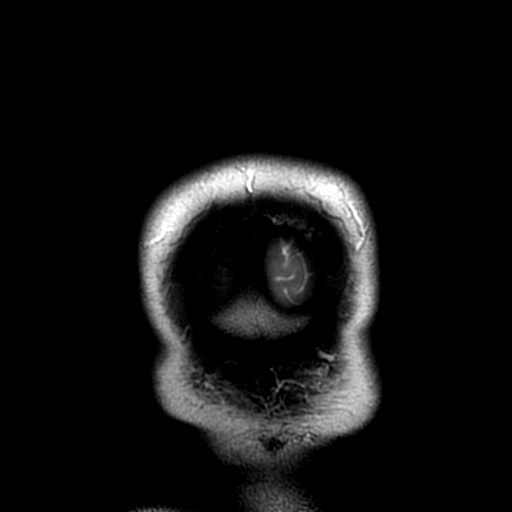

[Series 350: ADC · axial · 3.0mm · 0.94mm/px · z∈[-100,+59]mm · 4 of 54 slices shown (1 of 2)]
[im 1/54]
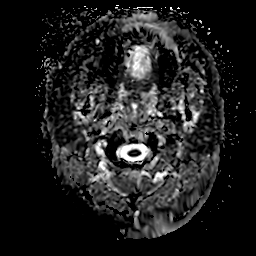
[im 18/54]
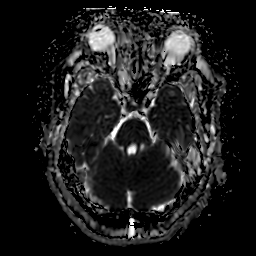
[im 36/54]
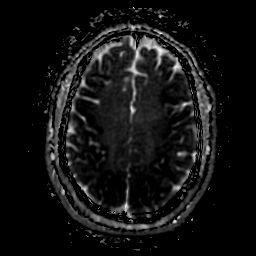
[im 54/54]
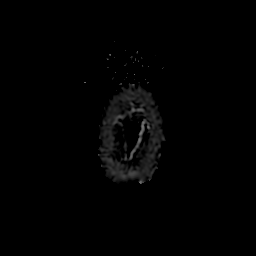

[Series 450: ADC · coronal · 4.0mm · 0.94mm/px · 3 of 36 slices shown (2 of 2)]
[im 1/36]
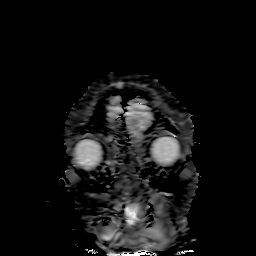
[im 18/36]
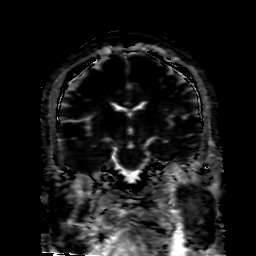
[im 36/36]
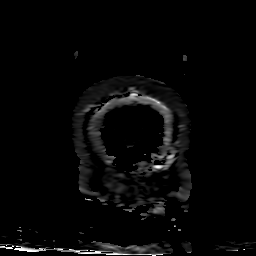

[33 of 48 positions shown; findings below may reference images not displayed]

FINDINGS: Brain: Today's study shows widespread hypoxic ischemic injury to the
cerebral hemispheric cortex, basal ganglia and thalami. No evidence
of mass effect or herniation. No hemorrhage. No hydrocephalus or
extra-axial collection.

Vascular: Major vessels at the base of the brain show flow.

Skull and upper cervical spine: Negative

Sinuses/Orbits: Mucosal thickening and fluid within the paranasal
sinuses, particularly the sphenoid sinus. Orbits negative. Bilateral
mastoid effusions.

Other: None
IMPRESSION: Diffuse hypoxic ischemic injury most pronounced affecting the
cerebral hemispheric cortex but with some involvement also of the
basal ganglia and thalami.

## 2020-07-09 IMAGING — DX DG CHEST 1V PORT
1 series · 1 of 1 positions shown · non-contrast
Comparison: 08/12/2019

CLINICAL DATA: Acute respiratory failure with hypoxia

EXAM:
PORTABLE CHEST 1 VIEW

[chest ap]
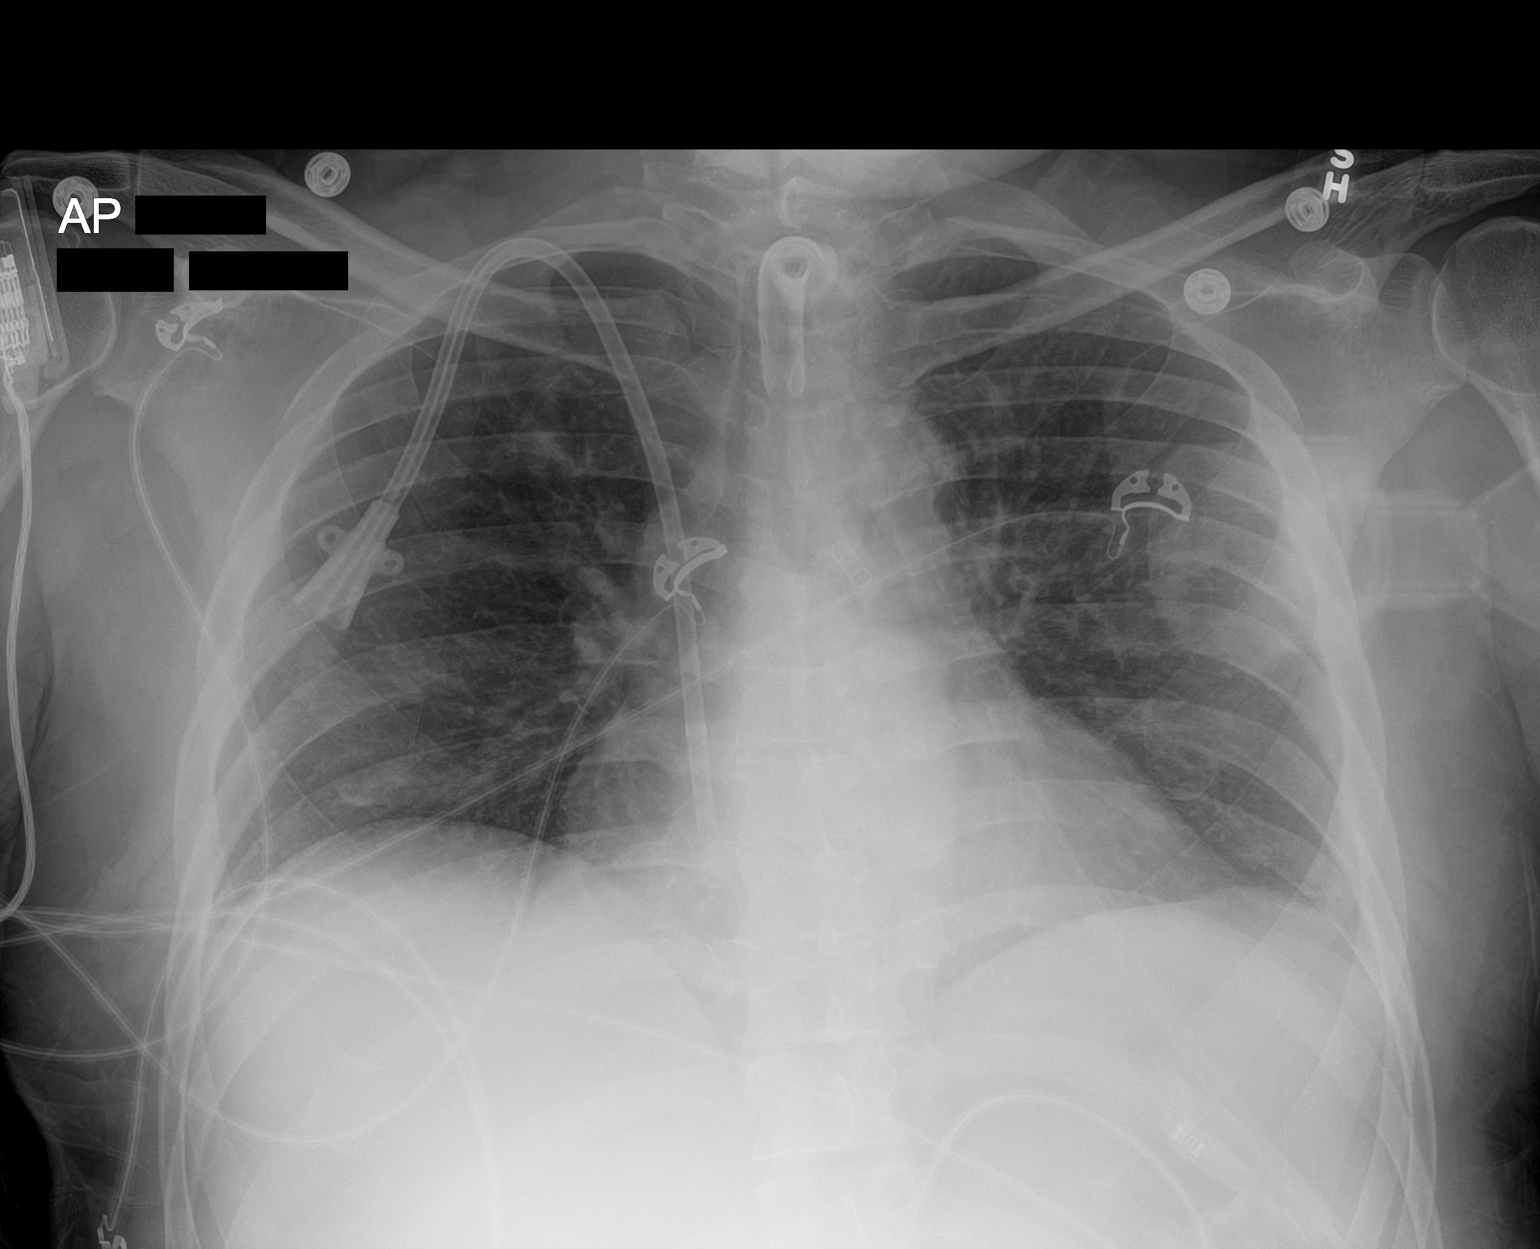

[1 of 1 positions shown; findings below may reference images not displayed]

FINDINGS: Interval placement of tracheostomy in good position. NG removed.
Right jugular dual lumen catheter tip in the right atrium unchanged.
No pneumothorax

Improved lung volumes with improvement in bibasilar atelectasis. No
edema or effusion.
IMPRESSION: Satisfactory tracheostomy placement

Improved aeration in the lung bases.
